# Patient Record
Sex: Female | Born: 1980 | Hispanic: Yes | Marital: Married | Smoking: Never smoker
Health system: Southern US, Community
[De-identification: ages and names within clinical notes are randomized; demographics above are authoritative.]

## PROBLEM LIST (undated history)

## (undated) ENCOUNTER — Inpatient Hospital Stay (HOSPITAL_COMMUNITY): Payer: Self-pay

---
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
## 2000-02-26 ENCOUNTER — Inpatient Hospital Stay (HOSPITAL_COMMUNITY): Admission: AD | Admit: 2000-02-26 | Discharge: 2000-02-26 | Payer: Self-pay | Admitting: *Deleted

## 2000-04-26 ENCOUNTER — Inpatient Hospital Stay (HOSPITAL_COMMUNITY): Admission: AD | Admit: 2000-04-26 | Discharge: 2000-04-26 | Payer: Self-pay | Admitting: *Deleted

## 2000-05-06 ENCOUNTER — Encounter (HOSPITAL_COMMUNITY): Admission: RE | Admit: 2000-05-06 | Discharge: 2000-05-10 | Payer: Self-pay | Admitting: Obstetrics and Gynecology

## 2000-05-08 ENCOUNTER — Encounter (INDEPENDENT_AMBULATORY_CARE_PROVIDER_SITE_OTHER): Payer: Self-pay | Admitting: Specialist

## 2000-05-08 ENCOUNTER — Inpatient Hospital Stay (HOSPITAL_COMMUNITY): Admission: AD | Admit: 2000-05-08 | Discharge: 2000-05-11 | Payer: Self-pay | Admitting: *Deleted

## 2000-05-14 ENCOUNTER — Inpatient Hospital Stay (HOSPITAL_COMMUNITY): Admission: AD | Admit: 2000-05-14 | Discharge: 2000-05-14 | Payer: Self-pay | Admitting: *Deleted

## 2006-03-15 ENCOUNTER — Emergency Department (HOSPITAL_COMMUNITY): Admission: EM | Admit: 2006-03-15 | Discharge: 2006-03-15 | Payer: Self-pay | Admitting: Emergency Medicine

## 2006-09-20 ENCOUNTER — Encounter (INDEPENDENT_AMBULATORY_CARE_PROVIDER_SITE_OTHER): Payer: Self-pay | Admitting: Family Medicine

## 2006-09-20 ENCOUNTER — Ambulatory Visit: Payer: Self-pay | Admitting: Family Medicine

## 2006-09-20 LAB — CONVERTED CEMR LAB
Eosinophils Relative: 1 % (ref 0–5)
Hemoglobin: 12.4 g/dL (ref 12.0–15.0)
Lymphocytes Relative: 21 % (ref 12–46)
MCV: 85.9 fL (ref 78.0–100.0)
Monocytes Absolute: 0.4 10*3/uL (ref 0.2–0.7)
Monocytes Relative: 4 % (ref 3–11)
Platelets: 272 10*3/uL (ref 150–400)
RDW: 12.9 % (ref 11.5–14.0)
Rh Type: POSITIVE
Rubella: 28.3 intl units/mL — ABNORMAL HIGH

## 2006-09-30 ENCOUNTER — Encounter (INDEPENDENT_AMBULATORY_CARE_PROVIDER_SITE_OTHER): Payer: Self-pay | Admitting: Family Medicine

## 2006-09-30 ENCOUNTER — Ambulatory Visit: Payer: Self-pay | Admitting: Sports Medicine

## 2006-09-30 LAB — CONVERTED CEMR LAB
Chlamydia, DNA Probe: NEGATIVE
GC Probe Amp, Genital: NEGATIVE

## 2006-10-07 ENCOUNTER — Encounter (INDEPENDENT_AMBULATORY_CARE_PROVIDER_SITE_OTHER): Payer: Self-pay | Admitting: Family Medicine

## 2006-10-07 ENCOUNTER — Ambulatory Visit (HOSPITAL_COMMUNITY): Admission: RE | Admit: 2006-10-07 | Discharge: 2006-10-07 | Payer: Self-pay | Admitting: Family Medicine

## 2006-11-02 ENCOUNTER — Ambulatory Visit: Payer: Self-pay | Admitting: Family Medicine

## 2006-12-07 ENCOUNTER — Ambulatory Visit: Payer: Self-pay | Admitting: Family Medicine

## 2006-12-23 ENCOUNTER — Ambulatory Visit: Payer: Self-pay | Admitting: Family Medicine

## 2006-12-28 ENCOUNTER — Encounter (INDEPENDENT_AMBULATORY_CARE_PROVIDER_SITE_OTHER): Payer: Self-pay | Admitting: Family Medicine

## 2006-12-28 ENCOUNTER — Ambulatory Visit: Payer: Self-pay | Admitting: Family Medicine

## 2007-01-04 ENCOUNTER — Ambulatory Visit: Payer: Self-pay | Admitting: *Deleted

## 2007-01-18 ENCOUNTER — Ambulatory Visit: Payer: Self-pay | Admitting: Family Medicine

## 2007-02-07 ENCOUNTER — Ambulatory Visit: Payer: Self-pay | Admitting: Family Medicine

## 2007-02-17 ENCOUNTER — Ambulatory Visit: Payer: Self-pay | Admitting: Family Medicine

## 2007-02-17 ENCOUNTER — Encounter (INDEPENDENT_AMBULATORY_CARE_PROVIDER_SITE_OTHER): Payer: Self-pay | Admitting: Family Medicine

## 2007-02-17 LAB — CONVERTED CEMR LAB
Chlamydia, DNA Probe: NEGATIVE
GC Probe Amp, Genital: NEGATIVE

## 2007-02-22 ENCOUNTER — Ambulatory Visit: Payer: Self-pay | Admitting: Family Medicine

## 2007-02-28 ENCOUNTER — Ambulatory Visit: Payer: Self-pay | Admitting: Family Medicine

## 2007-03-08 ENCOUNTER — Ambulatory Visit: Payer: Self-pay | Admitting: Family Medicine

## 2007-03-09 ENCOUNTER — Ambulatory Visit: Payer: Self-pay | Admitting: Gynecology

## 2007-03-09 ENCOUNTER — Inpatient Hospital Stay (HOSPITAL_COMMUNITY): Admission: AD | Admit: 2007-03-09 | Discharge: 2007-03-13 | Payer: Self-pay | Admitting: Obstetrics & Gynecology

## 2007-03-11 ENCOUNTER — Encounter (INDEPENDENT_AMBULATORY_CARE_PROVIDER_SITE_OTHER): Payer: Self-pay | Admitting: Family Medicine

## 2007-03-13 ENCOUNTER — Ambulatory Visit: Payer: Self-pay | Admitting: Family Medicine

## 2007-03-16 ENCOUNTER — Ambulatory Visit: Payer: Self-pay | Admitting: Family Medicine

## 2007-03-24 ENCOUNTER — Ambulatory Visit: Payer: Self-pay | Admitting: Family Medicine

## 2007-04-27 ENCOUNTER — Ambulatory Visit: Payer: Self-pay | Admitting: Family Medicine

## 2007-12-29 ENCOUNTER — Encounter (INDEPENDENT_AMBULATORY_CARE_PROVIDER_SITE_OTHER): Payer: Self-pay | Admitting: Family Medicine

## 2008-08-03 DIAGNOSIS — O42919 Preterm premature rupture of membranes, unspecified as to length of time between rupture and onset of labor, unspecified trimester: Secondary | ICD-10-CM

## 2009-05-17 ENCOUNTER — Encounter: Payer: Self-pay | Admitting: Family Medicine

## 2009-05-17 ENCOUNTER — Ambulatory Visit: Payer: Self-pay | Admitting: Family Medicine

## 2009-05-17 LAB — CONVERTED CEMR LAB
Antibody Screen: NEGATIVE
Basophils Absolute: 0 10*3/uL (ref 0.0–0.1)
Basophils Relative: 0 % (ref 0–1)
Eosinophils Relative: 1 % (ref 0–5)
Lymphs Abs: 2.1 10*3/uL (ref 0.7–4.0)
MCHC: 35 g/dL (ref 30.0–36.0)
Monocytes Absolute: 0.4 10*3/uL (ref 0.1–1.0)
Platelets: 291 10*3/uL (ref 150–400)
RDW: 12.6 % (ref 11.5–15.5)
Sickle Cell Screen: NEGATIVE
WBC: 10.1 10*3/uL (ref 4.0–10.5)

## 2009-05-24 ENCOUNTER — Encounter: Payer: Self-pay | Admitting: Family Medicine

## 2009-05-24 ENCOUNTER — Ambulatory Visit: Payer: Self-pay | Admitting: Family Medicine

## 2009-05-24 LAB — CONVERTED CEMR LAB: Chlamydia, DNA Probe: NEGATIVE

## 2009-05-29 ENCOUNTER — Encounter: Payer: Self-pay | Admitting: Family Medicine

## 2009-06-24 ENCOUNTER — Ambulatory Visit: Payer: Self-pay | Admitting: Family Medicine

## 2009-06-24 ENCOUNTER — Ambulatory Visit (HOSPITAL_COMMUNITY): Admission: RE | Admit: 2009-06-24 | Discharge: 2009-06-24 | Payer: Self-pay | Admitting: Family Medicine

## 2009-06-24 ENCOUNTER — Encounter: Payer: Self-pay | Admitting: Family Medicine

## 2009-07-17 ENCOUNTER — Ambulatory Visit: Payer: Self-pay | Admitting: Family Medicine

## 2009-07-17 ENCOUNTER — Inpatient Hospital Stay (HOSPITAL_COMMUNITY): Admission: AD | Admit: 2009-07-17 | Discharge: 2009-07-19 | Payer: Self-pay | Admitting: Obstetrics & Gynecology

## 2009-07-17 ENCOUNTER — Ambulatory Visit: Payer: Self-pay | Admitting: Family

## 2009-08-03 DIAGNOSIS — O42919 Preterm premature rupture of membranes, unspecified as to length of time between rupture and onset of labor, unspecified trimester: Secondary | ICD-10-CM

## 2009-08-27 ENCOUNTER — Ambulatory Visit: Payer: Self-pay | Admitting: Family Medicine

## 2009-11-04 ENCOUNTER — Encounter: Payer: Self-pay | Admitting: Family Medicine

## 2009-11-04 ENCOUNTER — Ambulatory Visit: Payer: Self-pay | Admitting: Family Medicine

## 2009-11-04 DIAGNOSIS — R109 Unspecified abdominal pain: Secondary | ICD-10-CM

## 2009-11-04 LAB — CONVERTED CEMR LAB: Beta hcg, urine, semiquantitative: POSITIVE

## 2009-11-11 ENCOUNTER — Encounter: Payer: Self-pay | Admitting: Family Medicine

## 2009-11-11 ENCOUNTER — Ambulatory Visit: Payer: Self-pay | Admitting: Family Medicine

## 2009-11-11 LAB — CONVERTED CEMR LAB
Eosinophils Absolute: 0.3 10*3/uL (ref 0.0–0.7)
Eosinophils Relative: 4 % (ref 0–5)
Hemoglobin: 12.9 g/dL (ref 12.0–15.0)
Lymphocytes Relative: 29 % (ref 12–46)
MCHC: 34.6 g/dL (ref 30.0–36.0)
Monocytes Absolute: 0.5 10*3/uL (ref 0.1–1.0)
Monocytes Relative: 5 % (ref 3–12)
Platelets: 258 10*3/uL (ref 150–400)
Sickle Cell Screen: NEGATIVE
WBC: 9 10*3/uL (ref 4.0–10.5)

## 2009-11-12 ENCOUNTER — Encounter: Payer: Self-pay | Admitting: Family Medicine

## 2009-11-19 ENCOUNTER — Inpatient Hospital Stay (HOSPITAL_COMMUNITY): Admission: AD | Admit: 2009-11-19 | Discharge: 2009-11-19 | Payer: Self-pay | Admitting: Obstetrics and Gynecology

## 2009-12-09 ENCOUNTER — Encounter: Payer: Self-pay | Admitting: *Deleted

## 2009-12-10 ENCOUNTER — Encounter: Payer: Self-pay | Admitting: Family Medicine

## 2009-12-16 ENCOUNTER — Ambulatory Visit: Payer: Self-pay | Admitting: Family Medicine

## 2009-12-16 ENCOUNTER — Encounter: Payer: Self-pay | Admitting: Family Medicine

## 2009-12-16 DIAGNOSIS — R300 Dysuria: Secondary | ICD-10-CM | POA: Insufficient documentation

## 2009-12-16 LAB — CONVERTED CEMR LAB
GC Probe Amp, Genital: NEGATIVE
Ketones, urine, test strip: NEGATIVE
Nitrite: NEGATIVE
Protein, U semiquant: NEGATIVE
Whiff Test: NEGATIVE
pH: 5.5

## 2009-12-20 ENCOUNTER — Encounter: Payer: Self-pay | Admitting: Family Medicine

## 2009-12-20 ENCOUNTER — Ambulatory Visit (HOSPITAL_COMMUNITY): Admission: RE | Admit: 2009-12-20 | Discharge: 2009-12-20 | Payer: Self-pay | Admitting: Family Medicine

## 2009-12-23 ENCOUNTER — Ambulatory Visit: Payer: Self-pay | Admitting: Family Medicine

## 2009-12-27 ENCOUNTER — Encounter: Payer: Self-pay | Admitting: Family Medicine

## 2010-01-01 ENCOUNTER — Ambulatory Visit: Payer: Self-pay | Admitting: Family Medicine

## 2010-01-01 DIAGNOSIS — N898 Other specified noninflammatory disorders of vagina: Secondary | ICD-10-CM | POA: Insufficient documentation

## 2010-01-01 LAB — CONVERTED CEMR LAB

## 2010-01-03 ENCOUNTER — Encounter: Payer: Self-pay | Admitting: Family Medicine

## 2010-01-09 ENCOUNTER — Ambulatory Visit: Payer: Self-pay | Admitting: Family Medicine

## 2010-01-15 ENCOUNTER — Ambulatory Visit (HOSPITAL_COMMUNITY): Admission: RE | Admit: 2010-01-15 | Discharge: 2010-01-15 | Payer: Self-pay | Admitting: Family Medicine

## 2010-01-16 ENCOUNTER — Ambulatory Visit: Payer: Self-pay | Admitting: Obstetrics & Gynecology

## 2010-01-23 ENCOUNTER — Ambulatory Visit: Payer: Self-pay | Admitting: Obstetrics & Gynecology

## 2010-01-30 ENCOUNTER — Ambulatory Visit: Payer: Self-pay | Admitting: Obstetrics & Gynecology

## 2010-01-30 ENCOUNTER — Ambulatory Visit (HOSPITAL_COMMUNITY): Admission: RE | Admit: 2010-01-30 | Discharge: 2010-01-30 | Payer: Self-pay | Admitting: Obstetrics & Gynecology

## 2010-02-06 ENCOUNTER — Ambulatory Visit: Payer: Self-pay | Admitting: Obstetrics and Gynecology

## 2010-02-12 ENCOUNTER — Inpatient Hospital Stay (HOSPITAL_COMMUNITY): Admission: AD | Admit: 2010-02-12 | Discharge: 2010-02-16 | Payer: Self-pay | Admitting: Obstetrics & Gynecology

## 2010-02-12 ENCOUNTER — Inpatient Hospital Stay (HOSPITAL_COMMUNITY): Admission: AD | Admit: 2010-02-12 | Discharge: 2010-02-12 | Payer: Self-pay | Admitting: Obstetrics and Gynecology

## 2010-02-12 ENCOUNTER — Ambulatory Visit: Payer: Self-pay | Admitting: Obstetrics & Gynecology

## 2010-02-14 ENCOUNTER — Ambulatory Visit (HOSPITAL_COMMUNITY): Admission: RE | Admit: 2010-02-14 | Discharge: 2010-02-14 | Payer: Self-pay | Admitting: Obstetrics & Gynecology

## 2010-02-15 ENCOUNTER — Encounter: Payer: Self-pay | Admitting: Obstetrics & Gynecology

## 2010-03-03 DEATH — deceased

## 2010-08-24 ENCOUNTER — Encounter: Payer: Self-pay | Admitting: *Deleted

## 2010-09-02 NOTE — Miscellaneous (Signed)
Summary: does not need high risk following her  Clinical Lists Changes Raynelle Fanning from [facility] clinics [phone]) stated prior spontaneous abortion & previous c sections do not qualify her for Hi Risk clinic. states she can be followed here. at 30 weeks she will make appt there to discuss options of repeat C section or VBAC. asked that she fax the referral back & I will notifiy the pcp.[place] RN  Dec 10, 2009 9:44 AM  appt with Dr. Gwendolyn Grant 5/16 at [facility] RN  Dec 10, 2009 10:37 AM

## 2010-09-02 NOTE — Assessment & Plan Note (Signed)
Summary: yeast infectionper pt/[place]   Vital Signs:  Patient profile:   30 year old female Weight:      169.7 pounds Temp:     98.4 degrees F Pulse rate:   85 / minute BP sitting:   118 / 80  Vitals Entered By: Loralee Pacas CMA (January 01, 2010 3:03 PM)  Primary Care Provider:  Renold Don MD  CC:  vag odor and decreased fetal movement.  History of Present Illness: C/O scant clear vag discharge.  Not pruritic.  Pregnant with recent antibiotic treatment.    Also C/O decreased fetal movement over last 24 hours.  Baby does not move much at baseline but less in past 24 h.  She is only at [redacted] wks gestation so I do not anticipate much in the way of fetal movement.  No other complaints.  Specifically denies fever, vag bleeding, STD exposure or constitutional symptoms   Allergies: No Known Drug Allergies  Physical Exam  General:  Well-developed,well-nourished,in no acute distress; alert,appropriate and cooperative throughout examination Abdomen:  Normal abd.  Fundus well below umbilicus.  FHTs heard with doppler at 150-160 BPM.   Genitalia:  Normal introitus for age, no external lesions, no vaginal discharge, mucosa pink and moist, no vaginal or cervical lesions, no vaginal atrophy, no friaility or hemorrhage, normal uterus size and position, no adnexal masses or tenderness Wet prep done despite lack of discharge.   Impression & Recommendations:  Problem # 1:  VAGINAL DISCHARGE (ICD-623.5) Complaint of vag discharge with no DC on exam and normal wet prep Orders: Wet Prep- [facility] (87210) [facility]- Est Level  3 (74259)  Problem # 2:  PREGNANCY, SUPERVISION OF OTHER NORMAL (ICD-V22.1) She is worried about decrease fetal movement - educated that at 14 weeks, fetal movement is not typically felt.  18 weeks is more likely.  Reassured about normal exam and FHTs.  Provided red flag symptoms for return.  FU normal next preg visit with Dr. Gwendolyn Grant.  Complete Medication List: 1)  Prenatal  Vitamins 0.8 Mg Tabs (Prenatal multivit-min-fe-fa) .Marland Kitchen.. 1 tab by mouth daily 2)  Flonase 50 Mcg/act Susp (Fluticasone propionate) .... 2 sprays each nostril daily  Laboratory Results  Date/Time Received: January 01, 2010 2:35 PM  Date/Time Reported: January 01, 2010 3:18 PM   [employer] Source: vag WBC/hpf: rare Bacteria/hpf: 2+  Rods Clue cells/hpf: none  Negative whiff Yeast/hpf: none Trichomonas/hpf: none Comments: ...............test performed by......Marland KitchenBonnie A. Swaziland, MLS (ASCP)cm

## 2010-09-02 NOTE — Letter (Signed)
Summary: Pregnancy Certification  [facility]  [address]   [place], [place] [address]   Phone: [phone]  Fax: [phone]    11/04/2009  Teresa Day [address] [place], [place]  [address]  This letter is to certify that the above named patient had a positive pregnancy test in my office today.  Sincerely,   Romero Belling MD

## 2010-09-02 NOTE — Miscellaneous (Signed)
  Clinical Lists Changes  Spoke with Milus Mallick the interpreter.  Patient scheduled wtih [facility] for this coming Thursday June 8 at 1:30 PM.  Renold Don MD  January 03, 2010 5:03 PM

## 2010-09-02 NOTE — Assessment & Plan Note (Signed)
Summary: ob visit/[place]   Vital Signs:  Patient profile:   30 year old female LMP:     09/25/2009 Weight:      167.3 pounds BP sitting:   114 / 79  Vitals Entered By: Garen Grams LPN (Dec 16, 2009 3:20 PM)  Primary Care Provider:  Renold Don MD   History of Present Illness: 30 yo Female here for follow-up appt for Return OB.  Please see Flowsheet and A&P for details.    Habits & Providers  Alcohol-Tobacco-Diet     Cigarette Packs/Day: n/a  Current Problems (verified): 1)  Dysuria  (ICD-788.1) 2)  Pregnancy, Supervision of Other Normal  (ICD-V22.1) 3)  Inguinal Pain, Bilateral  (ICD-789.09)  Current Medications (verified): 1)  Prenatal Vitamins 0.8 Mg Tabs (Prenatal Multivit-Min-Fe-Fa) .Marland Kitchen.. 1 Tab By Mouth Daily 2)  Flonase 50 Mcg/act Susp (Fluticasone Propionate) .... 2 Sprays Each Nostril Daily 3)  Cephalexin 500 Mg Caps (Cephalexin) .... One By Mouth Two Times A Day  Allergies (verified): No Known Drug Allergies  Past History:  Past medical, surgical, family and social histories (including risk factors) reviewed, and no changes noted (except as noted below).  Past Medical History: Reviewed history from 05/24/2009 and no changes required. Unremarkable.    Past Surgical History: Reviewed history from 05/24/2009 and no changes required. C-section x 2.  Otherwise no surgeries.   Family History: Reviewed history from 11/11/2009 and no changes required. Family history positive for HTN, asthma.  Denies any cancers, genetic abnormalities, or mental retardation.    Social History: Reviewed history from 05/24/2009 and no changes required. Patient lives with her boyfriend, who is also the father of her 2 other children.  States she feels safe at home, has not been hit or kicked anytime within the past year.  Denies any tobacco, alcohol, or drug use.    Review of Systems       no headaches, vision changes, chest pain, dyspnea, nausea/vomiting, changes in bowel habits,  lower extremity edema   Physical Exam  General:  Vital signs reviewed Well-developed, well-nourished patient in NAD.  Awake, cooperative.  Mouth:  oral mucosa moist, dentition fair Lungs:   Lungs are clear to auscultation, no crackles or wheezes. Heart:  RRR with II/VI murmur upper sternal border, non-radiating Abdomen:  gravid, fundal height and FHTs appropriate for gestational age, bowel sounds present in all four quadrants  Pulses:  distal pulses palpable Extremities:  no lower extremity edema   Impression & Recommendations:  Problem # 1:  PREGNANCY, SUPERVISION OF OTHER NORMAL (ICD-V22.1) 30 yo [id number] who presents today at 11.6 weeks by sure LMP.     Spontaneous abortion in February at 20+ weeks.  2 previous C-sections.  Will need discussion with OB at 30 weeks for delivery planning. Initial labs:  B+/Ab neg, Hgb 12.9/Hct 37.2, Platelets 258, Hep B Neg, RPR NR, Rubella Imm, Haskell Neg, HIV NR  Patient doing well.  Plan for Glucola test next visit as she has history of spont ab at 20+ weeks.  Discussed this patient with Dr. Mauricio Po, will need to decide whether she needs to be seen in [facility] for 20+ abortion.  Discussed with Dr. Swaziland previously, after much discussion decision was made not to be seen in [facility].  Will contact OBs at [facility] and make decision.  Fundal height and FHTs unable to be assessed as she is less than [redacted] weeks pregnant.  Will schedule for [place] for anatomy and fu in 4 weeks.    Orders: GC/Chlamydia-[facility] (  [id number]) Other OB visit- [facility] (OBCK)  Problem # 2:  DYSURIA (ICD-788.1) Patient diagnosed with UTI via urinalysis.  Plan to treat with Cephalexin 500 mg by mouth two times a day x 14 days.   Her updated medication list for this problem includes:    Cephalexin 500 Mg Caps (Cephalexin) ..... One by mouth two times a day  Orders: Urinalysis-[facility] (00000) Wet Prep- [facility] ([address]) Other OB visit- [facility] (OBCK)  Complete Medication List: 1)  Prenatal Vitamins 0.8 Mg Tabs  (Prenatal multivit-min-fe-fa) .Marland Kitchen.. 1 tab by mouth daily 2)  Flonase 50 Mcg/act Susp (Fluticasone propionate) .... 2 sprays each nostril daily 3)  Cephalexin 500 Mg Caps (Cephalexin) .... One by mouth two times a day  Other Orders: Ultrasound (Ultrasound)  Patient Instructions: 1)  It was good to see you again today. 2)  You have a urinary tract infection.  Take the medicine in AM and PM for 3 days.  3)  If you have any vaginal bleeding, loss of fluid, not feeling the baby move, or any contractions, call the clinic or go to MAU.   4)  Return in 4 weeks. Prescriptions: CEPHALEXIN 500 MG CAPS (CEPHALEXIN) one by mouth two times a day  #14 x 0   Entered and Authorized by:   Doralee Albino MD   Signed by:   Doralee Albino MD on 12/17/2009   Method used:   Electronically to        [facility] [phone]* (retail)       [address]       [place], [place]  [address]       Ph: [phone]       Fax: [phone]   RxID:   [id number] NITROFURANTOIN MACROCRYSTAL 100 MG CAPS (NITROFURANTOIN MACROCRYSTAL) Take 1 pill by mouth two times a day  #6 x 0   Entered and Authorized by:   Renold Don MD   Signed by:   Renold Don MD on 12/16/2009   Method used:   Electronically to        [facility] [phone]* (retail)       [address]       [place], [place]  [address]       Ph: [phone]       Fax: [phone]   RxID:   [id number]    OB Initial Intake Information    Positive HCG by: self    Race: Hispanic    Marital status: Single    Number of children at home: 2  FOB Information    Husband/Father of baby: Phylis Bougie    FOB occupation Holiday representative  Menstrual History    LMP (date): 09/25/2009    EDC by LMP: 07/02/2010    Best Working EDC: 07/02/2010    LMP - Character: normal    Menarche: 13 years    Menses interval: 28 days    Menstrual flow 3 days   Flowsheet View for Follow-up Visit    Estimated weeks of       gestation:     11 6/7    Weight:      167.3    Blood pressure:   114 / 79    Urine Protein:     negative    Urine Glucose:   negative    Urine Nitrite:     negative    Headache:     No    Nausea/vomiting:   No    Edema:  0    Vaginal bleeding:   no    Vaginal discharge:   no    Fundal height:      -    FHR:       -    Fetal activity:     yes    Labor symptoms:   no    Taking prenatal vits?   Y    Smoking:     n/a    Next visit:     4 wk    Resident:     Gwendolyn Grant    Preceptor:     Mauricio Po    Comment:     vaginal pain x 1 week, describes as burning    Flowsheet View for Follow-up Visit    Estimated weeks of       gestation:     11 6/7    Weight:     167.3    Blood pressure:   114 / 79    Urine protein:       negative    Urine glucose:    negative    Urine nitrite:     negative    Hx headache?     No    Nausea/vomiting?   No    Edema?     0    Bleeding?     no    Leakage/discharge?   no    Fetal activity:       yes    Labor symptoms?   no    Fundal height:      -    FHR:       -    Taking Vitamins?   Y    Smoking PPD:   n/a    Comment:     vaginal pain x 1 week, describes as burning    Next visit:     4 wk    Resident:     Gwendolyn Grant    Preceptor:     Link Snuffer Initial Intake Information    Positive HCG by: self    Race: Hispanic    Marital status: Single    Number of children at home: 2  FOB Information    Husband/Father of baby: Phylis Bougie    FOB occupation Holiday representative  Menstrual History    LMP (date): 09/25/2009    EDC by LMP: 07/02/2010    Best Working EDC: 07/02/2010    LMP - Character: normal    Menarche: 13 years    Menses interval: 28 days    Menstrual flow 3 days  Laboratory Results   Urine Tests  Date/Time Received: Dec 16, 2009 3:51 PM  Date/Time Reported: Dec 16, 2009 4:15 PM   Routine Urinalysis   Color: yellow Appearance: Clear Glucose: negative   (Normal Range: Negative) Bilirubin: negative   (Normal Range: Negative) Ketone: negative   (Normal Range:  Negative) Spec. Gravity: <1.005   (Normal Range: 1.003-1.035) Blood: small   (Normal Range: Negative) pH: 5.5   (Normal Range: 5.0-8.0) Protein: negative   (Normal Range: Negative) Urobilinogen: 0.2   (Normal Range: 0-1) Nitrite: negative   (Normal Range: Negative) Leukocyte Esterace: negative   (Normal Range: Negative)  Urine Microscopic WBC/HPF: rare RBC/HPF: rare Bacteria/HPF: trace Epithelial/HPF: rare    Comments: ...............test performed by......Marland KitchenBonnie A. Swaziland, MLS (ASCP)cm  Date/Time Received: Dec 16, 2009 4:05 PM  Date/Time Reported: Dec 16, 2009 4:23 PM   [employer] Source: vag WBC/hpf: 5-10 Bacteria/hpf: 3+  Rods Clue  cells/hpf: none  Negative whiff Yeast/hpf: none Trichomonas/hpf: none Comments: ...............test performed by......Marland KitchenBonnie A. [place], MLS (ASCP)cm     Appended Document: ob visit/[place] Patient with second-trimester loss, for referral to [facility] for further evaluation.

## 2010-09-02 NOTE — Miscellaneous (Signed)
  Clinical Lists Changes  Orders: Added new Referral order of Obstetric Referral (Obstetric) - Signed

## 2010-09-02 NOTE — Assessment & Plan Note (Signed)
Summary: 6 wk pp ck,df   Vital Signs:  Patient profile:   30 year old female Weight:      166 pounds Temp:     97.0 degrees F Pulse rate:   81 / minute BP sitting:   114 / 80  Vitals Entered By: Jone Baseman CMA (August 27, 2009 1:45 PM) CC: 6 week post partum Is Patient Diabetic? No Pain Assessment Patient in pain? no        Primary Care Provider:  Renold Don MD  CC:  6 week post partum.  History of Present Illness: 30 yo female here for 6 week postpartum check s/p delivery of premature nonviable infant at 23 weeks.  Patient presented to clinic with abdominal pain and bleeding in December, transferred immediately to MAU at [facility] where she went through premature labor.  Attempts to revive fetus were unsuccessful.  Unsure exact etiology of premature labor, placental abruption cannot be ruled out.  Patient here today for "medical check-up" as she wants to become pregnant again.  Does not desire birth control.    ROS:  No fevers, chills, abdominal pain, headache vision changes.  Just started menstrual cycle which is normal for her.    Habits & Providers  Alcohol-Tobacco-Diet     Tobacco Status: never  Current Medications (verified): 1)  P D Natal Vitamins/folic Acid  Tabs (Prenatal Multivit-Min-Fe-Fa)  Allergies (verified): No Known Drug Allergies  Physical Exam  General:  Vital signs reviewed Well-developed, well-nourished patient in NAD.  Awake, cooperative.  Lungs:  Normal respiratory effort, chest expands symmetrically. Lungs are clear to auscultation, no crackles or wheezes. Heart:  Normal rate and regular rhythm. S1 and S2 normal without gallop, murmur, click, rub or other extra sounds. Abdomen:  soft/nondistended/nontender, + bowel sounds all 4 quadrants.   Psych:  Cognition and judgment appear intact. Alert and cooperative with normal attention span and concentration. No apparent delusions, illusions, hallucinations   Impression &  Recommendations:  Problem # 1:  ROUTINE POSTPARTUM FOLLOW-UP (ICD-V24.2) Patient desires attempting to have more children.  Does not desire contraception at this time.  Reviewed patietn's records including most recent hospital stay.  No medical reason that would prevent patient from another attempt at pregnancy.  Will need to be followed in high risk clinic for 2 prior C-sections.  Discussed risks and benefits with patients, patient very certain about another attempt at pregnancy.  No complications/depression regarding most recent pregnancy outcome.  Told patient to continue with prenatal vitamins.   Orders: [facility]- Est Level  3 ([id number])  Complete Medication List: 1)  P D Natal Vitamins/folic Acid Tabs (Prenatal multivit-min-fe-fa)  Appended Document: 6 wk pp ck,df     Allergies: No Known Drug Allergies   Complete Medication List: 1)  P D Natal Vitamins/folic Acid Tabs (Prenatal multivit-min-fe-fa)  Other Orders: Postpartum Visit ([id number])

## 2010-09-02 NOTE — Miscellaneous (Signed)
Summary: High Risk Clinic Referral  Patient has been referred to [facility] clinic.  The following paperwork has been faxed: * Inital OB * Prenatal Flowsheet * Labs: prenatal panel * UA * Registration/Insurance information  Dennison Nancy RN  Dec 09, 2009 2:57 PM

## 2010-09-02 NOTE — Assessment & Plan Note (Signed)
Summary: FU/KH   Vital Signs:  Patient profile:   30 year old female LMP:     09/25/2008 Weight:      165 pounds BP sitting:   114 / 75  Vitals Entered By: Jone Baseman CMA (November 11, 2009 9:49 AM)  Primary Care Provider:  Renold Don MD  CC:  pregnancy.  History of Present Illness: Pt is 30 yo female with previous spontaneous abortion 3 months ago.  Positive pregnancy test at home last week, confirmed with positive pregnancy test in clinic.  Here for OB evaluation.  See OB flowsheet.  No problems or concerns per patient at this time.    Current Medications (verified): 1)  Prenatal Vitamins 0.8 Mg Tabs (Prenatal Multivit-Min-Fe-Fa) .Marland Kitchen.. 1 Tab By Mouth Daily 2)  Flonase 50 Mcg/act Susp (Fluticasone Propionate) .... 2 Sprays Each Nostril Daily  Allergies (verified): No Known Drug Allergies  Past History:  Past medical, surgical, family and social histories (including risk factors) reviewed, and no changes noted (except as noted below).  Past Medical History: Reviewed history from 05/24/2009 and no changes required. Unremarkable.    Past Surgical History: Reviewed history from 05/24/2009 and no changes required. C-section x 2.  Otherwise no surgeries.   Family History: Reviewed history from 05/24/2009 and no changes required. Family history positive for HTN, asthma.  Denies any cancers, genetic abnormalities, or mental retardation.    Social History: Reviewed history from 05/24/2009 and no changes required. Patient lives with her boyfriend, who is also the father of her 2 other children.  States she feels safe at home, has not been hit or kicked anytime within the past year.  Denies any tobacco, alcohol, or drug use.    Review of Systems       No headaches, vision changes, nausea, vomiting, abdominal pain, lower extremity swelling.  Physical Exam  General:  Vital signs reviewed Well-developed, well-nourished patient in NAD.  Awake, cooperative.  Mouth:  oral  mucosa moist, dentition fair Neck:  no lymphadenopathy Lungs:   Lungs are clear to auscultation, no crackles or wheezes. Heart:  RRR with II/VI murmur upper sternal border, non-radiating Abdomen:  soft/nondistended/nontender, good bowel sounds throughout Pulses:  distal pulses palpable lower extremities Extremities:  no lower extremity swelling Neurologic:  strength normal in all extremities and sensation intact to light touch.   Psych:  normally interactive, good eye contact, not anxious appearing, and not depressed appearing.     Impression & Recommendations:  Problem # 1:  PREGNANCY, SUPERVISION OF OTHER NORMAL (ICD-V22.1) 30 yo [id number] who presents today at 6.4 weeks after positive pregnancy test at home and confirmed in clinic.  Completed OB Flowsheet today, no changes from prior pregnancy.  Spontaneous abortion in February at 20+ weeks.  2 previous C-sections.  No need for high risk at this time, will need to schedule at 30 weeks for delivery options, most likely repeat LTCS.  Will obtain prenatal labs and screens today.   Orders: Prenatal-[facility] ([id number]) CBC-[facility] [id number]) HIV-[facility] [id number]) Sickle Cell Scr-[facility] ([id number]) Urine Culture-[facility] ([id number]) RPR-[facility] ([id number]) Other OB visit- [facility] (OBCK)  Complete Medication List: 1)  Prenatal Vitamins 0.8 Mg Tabs (Prenatal multivit-min-fe-fa) .Marland Kitchen.. 1 tab by mouth daily 2)  Flonase 50 Mcg/act Susp (Fluticasone propionate) .... 2 sprays each nostril daily  Patient Instructions: 1)  Congratulations on your new pregnancy! 2)  We will schedule you for a High Risk appt at [facility] because of your previous C-sections and the miscarriage. 3)  For your allergies, use the  Flonase 2 sprays in each nostril everyday.  It will prevent allergies and drainage.  For immediate symptom relief, use the Benadryl. 4)  For your constipation, schedule the prune juice every morning and evening.  Even after you start having bowel movements,  continue the prune juice.  If it doesn't get better, please let us know. 5)  We will get your labwork completed today.   Prescriptions: FLONASE 50 MCG/ACT SUSP (FLUTICASONE PROPIONATE) 2 sprays each nostril daily  #31 x 2   Entered and Authorized by:   Renold Don MD   Signed by:   Renold Don MD on 11/11/2009   Method used:   Print then Give to Patient   RxID:   [id number] FLONASE 50 MCG/ACT SUSP (FLUTICASONE PROPIONATE) 2 sprays each nostril daily  #31 x 2   Entered and Authorized by:   Renold Don MD   Signed by:   Renold Don MD on 11/11/2009   Method used:   Print then Give to Patient   RxID:   [id number]    Flowsheet View for Follow-up Visit    Weight:     165    Blood pressure:   114 / 75    OB Initial Intake Information    Positive HCG by: self    Race: Hispanic    Marital status: Single    Number of children at home: 2  FOB Information    Husband/Father of baby: Phylis Bougie    FOB occupation Holiday representative  Menstrual History    LMP (date): 09/25/2008    EDC by LMP: 07/02/2009    Best Working EDC: 07/02/2009    LMP - Character: normal    LMP - Reliable? : Yes    Menarche: 13 years    Menses interval: 28 days    Menstrual flow 3 days    Symptoms since LMP: amenorrhea   Flowsheet View for Follow-up Visit    Weight:     165    Blood pressure:   114 / 75  Prenatal Visit    FOB name: Phylis Bougie [facility] Confirmation:    New working [facility]: 07/02/2009    LMP reliable? Yes    Last menses onset (LMP) date: 09/25/2008    [facility] by LMP: 07/02/2009    Genetic History    Father of baby:   Phylis Bougie     Thalassemia:         father: no    Neural tube defect:       father: no    Down's Syndrome:       father: no    Tay-Sachs:         father: no    Sickle Cell Dz/Trait:       father: no    Hemophilia:         father: no    Muscular Dystrophy:       father: no    Cystic Fibrosis:       father: no    Huntington's Dz:       father: no    Mental  Retardation:       father: no    Fragile X:         father: no    Other Genetic or       Chromosomal Dz:       father: no    Child with other       birth defect:         father:  no  Infection Risk History    History of Parvovirus (Fifth Disease): no    Occupational Exposure to Children: none

## 2010-09-02 NOTE — Assessment & Plan Note (Signed)
Summary: R GROIN PAIN/KH   Vital Signs:  Patient profile:   30 year old female Weight:      164 pounds Temp:     98.2 degrees F oral Pulse rate:   79 / minute BP sitting:   113 / 75  (right arm) Cuff size:   regular  Vitals Entered By: Tessie Fass CMA (November 04, 2009 3:44 PM)  Primary Care Provider:  Renold Don MD   History of Present Illness: 30 yo female presenting with 1 day history of bilateral inguinal pain.  Pain is mild, worse with doing housework and walking.  Has not tried anything for it.  No abdominal pain, vaginal discharge or bleeding.  Also with positive home pregnancy test 04/02 and wants it confirmed in clinic today.  She has had 2 prior deliveries via C-section (confirmed to be LTCS in [place]).  Had a 23-week demise in December.  LMP 09/25/2009.  Ran out of prenatal vitamins 2 weeks ago and hasn't taken any since.  Denies medical illness.  Allergies: No Known Drug Allergies  Physical Exam  General:  Well-developed,well-nourished,in no acute distress; alert,appropriate and cooperative throughout examination Abdomen:  Bowel sounds positive,abdomen soft and non-tender without masses, organomegaly or hernias noted.   Impression & Recommendations:  Problem # 1:  INGUINAL PAIN, BILATERAL (ICD-789.09) Assessment New Uncertain cause, likely related to pregnancy though she is quite early in the pregnancy (LMP 02/23).  Recommended APAP, return to clinic or present to MAU if vaginal bleeding or discharge. Orders: [facility]- Est Level  3 ([id number])  Problem # 2:  PREGNANCY, SUPERVISION OF OTHER NORMAL (ICD-V22.1) Assessment: New LMP 02/23.  Positive UPT at home on 04/02, confirmed with positive UPT in clinic today.  Patient has had 2 prior LTCS's so will need referral to high risk clinic.  Also with 23 week fetal demise in December.  Is without insurance, but has [facility] appt scheduled for Wednesday and will RTC next week for initial OB visit with PCP.  Denies medical  illness.  Not taking prenatal vitamins--refilled for her today.  Complete Medication List: 1)  Prenatal Vitamins 0.8 Mg Tabs (Prenatal multivit-min-fe-fa) .Marland Kitchen.. 1 tab by mouth daily  Other Orders: U Preg-[facility] ([id number])  Patient Instructions: 1)  Pleasure to meet you today. 2)  Congratulations on your pregnancy. 3)  Please see [facility] immediately, then schedule a visit with [place]. 4)  Please schedule a follow-up appointment in 1 week with Dr. Gwendolyn Grant. Prescriptions: PRENATAL VITAMINS 0.8 MG TABS (PRENATAL MULTIVIT-MIN-FE-FA) 1 tab by mouth daily  #90 x 4   Entered by:   Romero Belling MD   Authorized by:   Zachery Dauer MD   Signed by:   Romero Belling MD on 11/04/2009   Method used:   Electronically to        [facility] [phone]* (retail)       [address]       [place], [place]  [address]       Ph: [phone]       Fax: [phone]   RxID:   [id number]   Laboratory Results   Urine Tests  Date/Time Received: November 04, 2009 4:26 PM  Date/Time Reported: November 04, 2009 4:29 PM     Urine HCG: positive Comments: ...........test performed by...........Marland KitchenTerese Door, CMA

## 2010-09-12 ENCOUNTER — Encounter: Payer: Self-pay | Admitting: *Deleted

## 2010-10-18 LAB — CBC
HCT: 28.1 % — ABNORMAL LOW (ref 36.0–46.0)
Hemoglobin: 10.2 g/dL — ABNORMAL LOW (ref 12.0–15.0)
Hemoglobin: 11.2 g/dL — ABNORMAL LOW (ref 12.0–15.0)
MCH: 31.6 pg (ref 26.0–34.0)
MCHC: 35.3 g/dL (ref 30.0–36.0)
MCHC: 36.3 g/dL — ABNORMAL HIGH (ref 30.0–36.0)
MCV: 89.4 fL (ref 78.0–100.0)
MCV: 89.6 fL (ref 78.0–100.0)
MCV: 90 fL (ref 78.0–100.0)
Platelets: 217 10*3/uL (ref 150–400)
RDW: 12 % (ref 11.5–15.5)
WBC: 10.2 10*3/uL (ref 4.0–10.5)
WBC: 11.6 10*3/uL — ABNORMAL HIGH (ref 4.0–10.5)

## 2010-10-18 LAB — CROSSMATCH: Antibody Screen: NEGATIVE

## 2010-10-19 LAB — POCT URINALYSIS DIP (DEVICE)
Bilirubin Urine: NEGATIVE
Glucose, UA: NEGATIVE mg/dL
Specific Gravity, Urine: 1.02 (ref 1.005–1.030)

## 2010-10-19 LAB — URINALYSIS, ROUTINE W REFLEX MICROSCOPIC
Bilirubin Urine: NEGATIVE
Glucose, UA: NEGATIVE mg/dL
Ketones, ur: NEGATIVE mg/dL
Specific Gravity, Urine: 1.02 (ref 1.005–1.030)
pH: 8 (ref 5.0–8.0)

## 2010-10-19 LAB — URINE MICROSCOPIC-ADD ON

## 2010-10-19 LAB — STREP B DNA PROBE: Strep Group B Ag: NEGATIVE

## 2010-10-19 LAB — CBC
MCH: 31.3 pg (ref 26.0–34.0)
MCHC: 34.6 g/dL (ref 30.0–36.0)
MCV: 90.5 fL (ref 78.0–100.0)
Platelets: 240 10*3/uL (ref 150–400)

## 2010-10-19 LAB — RAPID HIV SCREEN (WH-MAU): Rapid HIV Screen: NONREACTIVE

## 2010-10-19 LAB — WET PREP, GENITAL: Clue Cells Wet Prep HPF POC: NONE SEEN

## 2010-10-20 LAB — POCT URINALYSIS DIP (DEVICE)
Bilirubin Urine: NEGATIVE
Glucose, UA: NEGATIVE mg/dL
Ketones, ur: NEGATIVE mg/dL
Nitrite: NEGATIVE
Nitrite: NEGATIVE
Protein, ur: NEGATIVE mg/dL
Urobilinogen, UA: 0.2 mg/dL (ref 0.0–1.0)
pH: 6 (ref 5.0–8.0)
pH: 7 (ref 5.0–8.0)

## 2010-10-20 LAB — GLUCOSE, CAPILLARY: Glucose-Capillary: 110 mg/dL — ABNORMAL HIGH (ref 70–99)

## 2010-10-21 LAB — URINALYSIS, ROUTINE W REFLEX MICROSCOPIC
Bilirubin Urine: NEGATIVE
Ketones, ur: NEGATIVE mg/dL
Nitrite: NEGATIVE
Protein, ur: NEGATIVE mg/dL
Specific Gravity, Urine: <1.005 — ABNORMAL LOW (ref 1.005–1.030)
Urobilinogen, UA: 0.2 mg/dL (ref 0.0–1.0)

## 2010-10-21 LAB — URINE MICROSCOPIC-ADD ON

## 2010-11-03 LAB — CBC
HCT: 32.6 % — ABNORMAL LOW (ref 36.0–46.0)
Hemoglobin: 11.3 g/dL — ABNORMAL LOW (ref 12.0–15.0)
MCHC: 34.8 g/dL (ref 30.0–36.0)
MCV: 89.6 fL (ref 78.0–100.0)
RBC: 3.64 MIL/uL — ABNORMAL LOW (ref 3.87–5.11)
RDW: 12.8 % (ref 11.5–15.5)

## 2010-11-04 LAB — DIFFERENTIAL
Basophils Absolute: 0.1 10*3/uL (ref 0.0–0.1)
Basophils Relative: 0 % (ref 0–1)
Lymphocytes Relative: 9 % — ABNORMAL LOW (ref 12–46)
Monocytes Absolute: 0.6 10*3/uL (ref 0.1–1.0)
Neutro Abs: 18.8 10*3/uL — ABNORMAL HIGH (ref 1.7–7.7)

## 2010-11-04 LAB — CBC
Hemoglobin: 12.7 g/dL (ref 12.0–15.0)
MCHC: 34.6 g/dL (ref 30.0–36.0)
Platelets: 256 10*3/uL (ref 150–400)
RDW: 12.4 % (ref 11.5–15.5)

## 2010-11-04 LAB — STREP B DNA PROBE: Strep Group B Ag: NEGATIVE

## 2010-11-04 LAB — GC/CHLAMYDIA PROBE AMP, GENITAL: Chlamydia, DNA Probe: NEGATIVE

## 2010-11-04 LAB — WET PREP, GENITAL: Clue Cells Wet Prep HPF POC: NONE SEEN

## 2010-11-04 LAB — RPR: RPR Ser Ql: NONREACTIVE

## 2010-12-16 NOTE — Op Note (Signed)
Teresa Day, Teresa Day            ACCOUNT NO.:  [id number]   MEDICAL RECORD NO.:  [id number]          PATIENT TYPE:  INP   LOCATION:  9116                          [facility]ILITY:  [facility]   PHYSICIAN:  Ginger Carne, MD  DATE OF BIRTH:  27-Jun-1981   DATE OF PROCEDURE:  03/10/2007  DATE OF DISCHARGE:                               OPERATIVE REPORT   PREOPERATIVE DIAGNOSES:  1. Previous cesarean section.  2. Attempted vaginal birth after cesarean section.  3. Arrest of descent.  4. Occiput-posterior presentation.   POSTOPERATIVE DIAGNOSES:  1. Previous cesarean section.  2. Attempted vaginal birth after cesarean section.  3. Arrest of descent.  4. Occiput-posterior presentation.  5. Viable female infant.   PROCEDURE:  Repeat low transverse cesarean section after vaginal birth  after cesarean attempt.   SURGEON:  Ginger Carne, M.D.   ASSISTANT:  Karlton Lemon, M.D.   ANESTHESIA:  Epidural.   FINDINGS:  1. Term infant female delivered in occiput posterior position, vertex,      weighing 6 pounds, 10 ounces.  Apgars of 8 at one minute and 9 at      five minutes.  2. Uterus, tubes, and ovaries showing normal decidual changes of      pregnancy.   ESTIMATED BLOOD LOSS:  600 ml.   DRAINS:  Foley with 100 out.   LINES:  An IV with 1100 ml in.   COMPLICATIONS:  None immediate.   DESCRIPTION OF PROCEDURE:  Patient was taken to the operating room and  was prepped and draped in the usual sterile fashion in the supine  position with the left lateral uterine displacement.  After insuring  adequate anesthesia, a Pfannenstiel skin incision was made using the  scalpel.  The incision was carried down through the subcutaneous tissues  using the scalpel.  The rectus fascia was nicked in the midline, and the  incision was extended laterally in each direction using Mayo scissors.  The parietal peritoneum was identified, grasped between two hemostats,  elevated, and incised with  scissors under direct visualization.  The  bladder blade was placed.  The uterus was visualized.  A low transverse  uterine incision was made using the scalpel, and this incision was  extended laterally and superiorly using blunt dissection and bandage  scissors.  A hand was placed within the uterine cavity, used to elevate  and flex the head of the infant who was in the occiput posterior  position.  The head of the infant was delivered without difficulty, and  the infant was bulb-suctioned after delivery of the head.  The body was  then delivered without difficulty.  The cord was doubly clamped and cut,  and the infant handed to the nursery team in attendance.  Cord blood was  collected.  The placenta was delivered manually and appeared intact.  Due to scar tissue, the uterus was unable to be exteriorized.  The  uterine incision was clamped at the edges and superiorly and inferiorly  with ring clamps.  The uterine incision was closed in a running locked  fashion with 0 Vicryl.  The area was  inspected, and the right lateral  edge was shown to be bleeding.  A vertical mattress suture was placed in  the area of bleeding at the right lateral edge of the uterine incision.  With this, hemostasis was obtained.  The uterine incision was inspected  and found to have adequate hemostasis.  The rectus fascia was  approximated with two sutures of 0 Vicryl beginning laterally on the  left.  A second suture was begun in the midline and extended laterally  to the right.  Small areas of bleeding in the subcutaneous tissue were  controlled using the Bovie cautery.  The skin was approximated with  stainless steel skin staples.  Sponge and needle counts were correct x2.  The patient tolerated the procedure well and went to recovery in stable  condition.      Karlton Lemon, MD      Ginger Carne, MD  Electronically Signed    NS/MEDQ  D:  03/10/2007  T:  03/10/2007  Job:  [id number]

## 2010-12-19 NOTE — Op Note (Signed)
[facility]  Patient:    Teresa Day, Teresa Day                     MRN: [id number] Proc. Date: 05/09/00 Adm. Date:  [id number] Disc. Date: [id number] Attending:  Antionette Char                           Operative Report  PREOPERATIVE DIAGNOSIS:       Arrest of descent, probable persistent occiput                               posterior position.  POSTOPERATIVE DIAGNOSIS:      Arrest of descent, probable persistent occiput                               posterior position, and persistent right occiput                               posterior position.  OPERATION:                    Primary low transverse cesarean section.  SURGEON:                      Charles A. Clearance Coots, M.D.  ASSISTANT:                    Andrey Spearman, M.D.  ANESTHESIA:                   Epidural.  ESTIMATED BLOOD LOSS:         800 ml.  IV FLUIDS:                    2200 ml.  URINE OUTPUT:                 225 ml, clear.  COMPLICATIONS:                None.  FOLEY:                        To gravity.  FINDINGS:                     A viable female at 37 with Apgars of 8 at one minute and 9 at five minutes.  Weight of 8 pounds.  Normal uterus, ovaries, and fallopian tubes.  DESCRIPTION OF PROCEDURE:     The patient was brought to the operating room and after a satisfactory redosing of the epidural, the abdomen was prepped and draped in the usual sterile fashion.  A Pfannenstiel skin incision was made with the scalpel that was deepened down to the fascia with the scalpel.  The fascia was nicked in the midline and the fascial incision was extended to the left and to the right with curved Mayo scissors.  The superior and inferior fascial edges were taken off of the rectus muscle with both blunt and sharp dissection.  The rectus muscle was bluntly divided in the midline and the peritoneum was entered distally, and was digitally extended to the left and to the right.  The bladder  blade was positioned and the vesicouterine fold of the peritoneum  above the reflection of the urinary bladder was grasped with forceps, and was incised and undermined with Metzenbaum scissors.  The incision was extended to the left and to the right with the Metzenbaum scissors.  The bladder flap was bluntly developed and the bladder blade was repositioned in front of the urinary bladder, placing it well out of the operative field.  The uterus was entered with sharp strokes of the scalpel.  A moderate amount of clear fluid was expelled.  The uterine incision was extended to the left and to the right in a smile configuration with the bandage scissors.  The vertex was then noted to be right occiput posterior and was rotated to the occiput anterior position, and was delivered with the aid of fundal pressure from the assistant.  The infants mouth and nose were suctioned with the suction bulb, and the delivery was completed with the aid of fundal pressure from the assistant.  The umbilical cord was doubly clamped and cut, and the infant was handed off to the nursing staff.                                Cord blood was obtained and the placenta was spontaneously expelled from the uterine cavity intact.  The uterus was exteriorized and the endometrial surface was thoroughly debrided with a dry lap sponge.  The edges of the uterine incision were grasped with ring forceps and the uterus was closed with a continuous interlocking suture of 0 Monocryl from each corner to the center.  Hemostasis was excellent.  The uterus was placed back in its normal anatomic position.  The pelvic cavity was thoroughly irrigated with warm saline solution and all clots were removed.  The closure of the uterus was again observed for hemostasis and no active bleeding was noted.  The abdomen was then closed as follows; the fascia was closed with a continuous suture of 0 PDS from each corner to the center.  The  subcutaneous tissue was thoroughly irrigated with warm saline solution, and all areas of subcutaneous bleeding were coagulated with the Bovie.  The skin was then approximated with surgical stainless steel staples.  A sterile bandage was applied to the incision closure.                                The surgical technician indicated that all needle, sponge, and instrument counts were correct.  The patient tolerated the procedure well and was transported to the recovery room in satisfactory condition. DD:  05/09/00 TD:  05/10/00 Job: [id number]

## 2010-12-19 NOTE — Discharge Summary (Signed)
NAMEHERMENA, Teresa Day            ACCOUNT NO.:  [id number]   MEDICAL RECORD NO.:  [id number]          PATIENT TYPE:  INP   LOCATION:  9116                          [facility]   PHYSICIAN:  Lazaro Arms, M.D.   DATE OF BIRTH:  11/10/1980   DATE OF ADMISSION:  03/09/2007  DATE OF DISCHARGE:  03/13/2007                               DISCHARGE SUMMARY   The patient was admitted on March 09, 2007, in active labor with a  strong desire for a trial of labor after previously having a C-section.  On admission the patient was a 30 year old G2, P1-0-0-1 at 12 weeks and  2 days' gestation, who had spontaneous rupture of membranes and was  having regular contractions.  Her obstetrical history was significant  for a previous cesarean section in 2001, of a female infant for probable  OP positioning. Risks and benefits of a trial of labor were reviewed  extensively with the patient and a consent form was signed to proceed  with a trial of labor.  On March 10, 2007, the decision was made to  proceed with a repeat low-transverse cesarean section secondary to  arrest of descent and occiput posterior presentation.  On the 7th, a term female infant weighing 6 pounds and 10 ounces with  Apgar's of 8 and 9, respectively, was delivered via repeat low-  transverse C-section by Dr. Blima Rich and Dr. Karlton Lemon.  Please see the dictated operative note for the details of this surgery.   This patient proceeded in a normal postoperative and post partum course  and remained afebrile throughout her stay and her vital signs were  stable as well as her laboratory evaluation.  The patient was discharged  on postoperative day #3 on March 13, 2007, at a 30 year old gravida 2,  para 2-0-0-2, on postoperative day #3, status post repeat low-transverse  C-section after a failed trial of labor.  The patient was discharged  home with her female infant, breast and bottle feeding.   She was given a prescription  for:  1. Micronor for contraception.  2. Motrin 600 mg p.o. q.6 h. p.r.n. cramping.  3. Percocet 1-2 tablets q.4-6 h. p.r.n. pain.  4. Prenatal vitamins 1 p.o. daily.   She was instructed to follow up with Dr. Melynda Ripple at the [facility] for her staple removal on the following Thursday.      Maylon Cos, C.N.M.      Lazaro Arms, M.D.  Electronically Signed    SS/MEDQ  D:  05/23/2007  T:  05/23/2007  Job:  [id number]

## 2011-05-18 LAB — CBC
HCT: 32.7 — ABNORMAL LOW
Hemoglobin: 11.2 — ABNORMAL LOW
MCHC: 34.2
MCHC: 34.4
MCV: 85.9
Platelets: 216
Platelets: 263
RDW: 15.2 — ABNORMAL HIGH
WBC: 15 — ABNORMAL HIGH

## 2011-05-18 LAB — RPR: RPR Ser Ql: NONREACTIVE

## 2011-05-18 LAB — TYPE AND SCREEN: ABO/RH(D): B POS

## 2011-11-04 ENCOUNTER — Ambulatory Visit (INDEPENDENT_AMBULATORY_CARE_PROVIDER_SITE_OTHER): Payer: Self-pay | Admitting: Family Medicine

## 2011-11-04 ENCOUNTER — Encounter: Payer: Self-pay | Admitting: Family Medicine

## 2011-11-04 VITALS — BP 111/71 | HR 75 | Temp 98.0°F | Ht 59.0 in | Wt 166.0 lb

## 2011-11-04 DIAGNOSIS — IMO0002 Reserved for concepts with insufficient information to code with codable children: Secondary | ICD-10-CM

## 2011-11-04 NOTE — Assessment & Plan Note (Signed)
Discussed at length with patient that first-line treatment for this should be lubrication during sexual intercourse. Discussed the potential of either oral sex or buying personal lubrication that she can get at most retail stores. Patient will try this and followup with primary care provider in 2 weeks if not better.

## 2011-11-04 NOTE — Patient Instructions (Signed)
Dyspareunia Dyspareunia is pain during sexual intercourse. It is most common in women, but it also happens in men.  CAUSES  Female The pain from this condition is usually felt when anything is put into the vagina, but any part of the genitals may cause pain during sex. Even sitting or wearing pants can cause pain. Sometimes, a cause cannot be found. Some causes of pain during intercourse are:  Infections of the skin around the vagina.   Vaginal infections, such as a yeast, bacterial, or viral infection.   Vaginismus. This is the inability to have anything put in the vagina even when the woman wants it to happen. There is an automatic muscle contraction and pain. The pain of the muscle contraction can be so severe that intercourse is impossible.   Allergic reaction from spermicides, semen, condoms, scented tampons, soaps, douches, and vaginal sprays.   A fluid-filled sac (cyst) on the Bartholin or Skene glands, located at the opening of the vagina.   Scar tissue in the vagina from a surgically enlarged opening (episiotomy) or tearing after delivering a baby.   Vaginal dryness. This is more common in menopause. The normal secretions of the vagina are decreased. Changes in estrogen levels and increased difficulty becoming aroused can cause painful sex. Vaginal dryness can also happen when taking birth control pills.   Thinning of the tissue (atrophy) of the vulva and vagina. This makes the area thinner, smaller, unable to stretch to accommodate a penis, and prone to infection and tearing.   Vulvar vestibulitis or vestibulodynia.This is a condition that causes pain involving the area around the entrance to the vagina.The most common cause in young women is birth control pills.Women with low estrogen levels (postmenopausal women) may also experience this.Other causes include allergic reactions, too many nerve endings, skin conditions, and pelvic muscles that cannot relax.   Vulvar dermatoses.  This includes skin conditions such as lichen sclerosus and lichen planus.   Lack of foreplay to lubricate the vagina. This can cause vaginal dryness.   Noncancerous tumors (fibroids) in the uterus.   Uterus lining tissue growing outside the uterus (endometriosis).   Pregnancy that starts in the fallopian tube (tubal pregnancy).   Pregnancy or breastfeeding your baby. This can cause vaginal dryness.   A tilting or prolapse of the uterus. Prolapse is when weak and stretched muscles around the uterus allow it to fall into the vagina.   Problems with the ovaries, cysts, or scar tissue. This may be worse with certain sexual positions.   Previous surgeries causing adhesions or scar tissue in the vagina or pelvis.   Bladder and intestinal problems.   Psychological problems (such as depression or anxiety). This may make pain worse.   Negative attitudes about sex, experiencing rape, sexual assault, and misinformation about sex. These issues are often related to some types of pain.   Previous pelvic infection, causing scar tissue in the pelvis and on the female organs.   Cyst or tumor on the ovary.   Cancer of the female organs.   Certain medicines.   Medical problems such as diabetes, arthritis, or thyroid disease.  Female In men, there are many physical causes of sexual discomfort. Some causes of pain during intercourse are:  Infections of the prostate, bladder, or seminal vesicles. This can cause pain after ejaculation.   An inflamed bladder (interstitial cystitis). This may cause pain from ejaculation.   Gonorrheal infections. This may cause pain during ejaculation.   An inflamed urethra (urethritis) or inflamed  prostate (prostatitis). This can make genital stimulation painful or uncomfortable.   Deformities of the penis, such as Peyronie's disease.   A tight foreskin.   Cancer of the female organs.   Psychological problems. This may make pain worse.  DIAGNOSIS   Your  caregiver will take a history and have you describe where the pain is located (outside the vagina, in the vagina, in the pelvis). You may be asked when you experience pain, such as with penetration or with thrusting.   Following this, your caregiver will do a physical exam. Let your caregiver know if the exam is too painful.   During the final part of the female exam, your caregiver will feel your uterus and ovaries with one hand on the abdomen and one finger in your vagina. This is a pelvic exam.   Blood tests, a Pap test, cultures for infection, an ultrasound test, and X-rays may be done. You may need to see a specialist for female problems (gynecologist).   Your caregiver may do a CT scan, MRI, or laparoscopy. Laparoscopy is a procedure to look into the pelvis with a lighted tube, through a cut (incision) in the abdomen.  TREATMENT  Your caregiver can help you determine the best course of treatment. Sometimes, more testing is done. Continue with the suggested testing until your caregiver feels sure about your diagnosis and how to treat it. Sometimes, it is difficult to find the reason for the pain. The search for the cause and treatment can be frustrating. Treatment often takes several weeks to a few months before you notice any improvement. You may also need to avoid sexual activity until symptoms improve.Continuing to have sex when it hurts can delay healing and actually make the problem worse. The treatment depends on the cause of the pain. Treatment may include:  Medicines such as antibiotics, vaginal or skin creams, hormones, or antidepressants.   Minor or major surgery.   Psychological counseling or group therapy.   Kegel exercises and vaginal dilators to help certain cases of vaginismus (spasms). Do this only if recommended by your caregiver.Kegel exercises can make some problems worse.   Applying lubrication as recommended by your caregiver if you have dryness.   Sex therapy for  you and your sex partner.  It is common for the pain to continue after the reason for the pain has been treated. Some reasons for this include a conditioned response. This means the person having the pain becomes so familiar with the pain that the pain continues as a response, even though the cause is removed. Sex therapy can help with this problem. HOME CARE INSTRUCTIONS   Follow your caregiver's instructions about taking medicines, tests, counseling, and follow-up treatment.   Do not use scented tampons, douches, vaginal sprays, or soaps.   Use water-based lubricants for dryness. Oil lubricants can cause irritation.   Do not use spermicides or condoms that irritate you.   Openly discuss with your partner your sexual experience, your desires, foreplay, and different sexual positions for a more comfortable and enjoyable sexual relationship.   Join group sessions for therapy, if needed.   Practice safe sex at all times.   Empty your bladder before having intercourse.   Try different positions during sexual intercourse.   Take over-the-counter pain medicine recommended by your caregiver before having sexual intercourse.   Do not wear pantyhose. Knee-high and thigh-high hose are okay.   Avoid scrubbing your vulva with a washcloth. Wash the area gently and pat dry   with a towel.  SEEK MEDICAL CARE IF:   You develop vaginal bleeding after sexual intercourse.   You develop a lump at the opening of your vagina, even if it is not painful.   You have abnormal vaginal discharge.   You have vaginal dryness.   You have itching or irritation of the vulva or vagina.   You develop a rash or reaction to your medicine.  SEEK IMMEDIATE MEDICAL CARE IF:   You develop severe abdominal pain during or shortly after sexual intercourse. You could have a ruptured ovarian cyst or ruptured tubal pregnancy.   You have a fever.   You have painful or bloody urination.   You have painful sexual  intercourse, and you never had it before.   You pass out after having sexual intercourse.  Document Released: 08/09/2007 Document Revised: 07/09/2011 Document Reviewed: 10/20/2010 [facility] Patient Information 2012 [place], [place].

## 2011-11-04 NOTE — Progress Notes (Signed)
  Subjective:    Patient ID: Teresa Day, female    DOB: Aug 15, 1980, 31 y.o.   MRN: [id number]  HPI 31 year old female Spanish-speaking only here with dyspareunia patient states that this started approximately 2 months ago. Patient denies any new medications is taking some prenatal vitamins but otherwise nothing has changed. Patient is still with the same partner. Patient would like to have another child but is having trouble having intercourse because it hurts. Patient denies any bleeding any weight changes or any fevers or chills. Patient has been seen by health department over the course of the last 2 months and has been tested for sexually transmitted diseases and they have all been negative. Patient would like to avoid another pelvic exam if possible. Has not tried any lubrication. No oral sex prior to intercourse.  Review of Systems Denies fever, chills, nausea vomiting abdominal pain any vaginal discharge.    Objective:   Physical Exam General: No apparent distress overweight female Abdominal exam: Bowel sounds positive nontender nondistended uterus not palpated Pelvic exam deferred.     Assessment & Plan:

## 2011-11-16 ENCOUNTER — Other Ambulatory Visit (HOSPITAL_COMMUNITY)
Admission: RE | Admit: 2011-11-16 | Discharge: 2011-11-16 | Disposition: A | Discharge status: Home / Self Care | Payer: Self-pay | Source: Ambulatory Visit | Attending: Family Medicine | Admitting: Family Medicine

## 2011-11-16 ENCOUNTER — Ambulatory Visit (INDEPENDENT_AMBULATORY_CARE_PROVIDER_SITE_OTHER): Payer: Self-pay | Admitting: Family Medicine

## 2011-11-16 ENCOUNTER — Encounter: Payer: Self-pay | Admitting: Family Medicine

## 2011-11-16 VITALS — BP 124/80 | HR 88 | Ht 59.0 in | Wt 165.0 lb

## 2011-11-16 DIAGNOSIS — N898 Other specified noninflammatory disorders of vagina: Secondary | ICD-10-CM

## 2011-11-16 DIAGNOSIS — Z113 Encounter for screening for infections with a predominantly sexual mode of transmission: Secondary | ICD-10-CM | POA: Insufficient documentation

## 2011-11-16 LAB — POCT WET PREP (WET MOUNT): Clue Cells Wet Prep Whiff POC: NEGATIVE

## 2011-11-16 MED ORDER — FLUCONAZOLE 150 MG PO TABS: ORAL_TABLET | ORAL | Status: DC

## 2011-11-16 NOTE — Progress Notes (Signed)
  Subjective:    Patient ID: Teresa Day, female    DOB: 1981/06/13, 31 y.o.   MRN: [id number]  HPI Patient visit conducted in Spanish.  Patient seen here 11 days ago for complaint of dyspareunia; was given recommendation for vaginal lubricant with intercourse, which has not helped.  Complaint of thin malodorous vaginal discharge and vaginal mucosa irritation.  Denies dysuria or polyuria, or changes in the urine appearance or odor.  Prior diagnosis "yeast" in Feb 2013 at [facility], treated with one-time med (?diflucan).  Sexually active with spouse of several years.  LMP October 26, 2011, no contraception.  Trying actively to conceive for past 3 months, distraught that she has not yet conceived.  G4P2, s/p SAB x 2.  Youngest daughter 28 years old.    Review of Systems  Low back pain that radiates to posterior thigh, interior thigh.  No back trauma. Some weight gain which she ascribes to eating out with friends (gained 25# in past 6 months)     Objective:   Physical Exam Alert, no apparent distress. Emotional lability HEENT Neck supple. THyroid non-nodular. No cervical adenopathy COR S1S2, no extra sounds PULM Clear bilaterally, no rales or wheezes GYN Vaginal mucosa unremarkable.  Cervix without lesions and non-friable; clear mucinous discharge from os. Bimanual exam without CMT or adnexal tenderness.  MSK> Straight leg raise unremarkable.       Assessment & Plan:

## 2011-11-16 NOTE — Patient Instructions (Signed)
Fue un placer verle hoy; despues de los estudios hechos hoy en nuestro laboratorio, no se ha identificado por cierto a la causa de sus sintomas.    Estoy mandando un cultivo de la [place] de hoy, le llamo al [phone] con los [place] en cuando los tenga.   Por favor tome la medicina recetada (fluconazole 150mg ) una tableta por boca, una hoy y la segunda en 7 dias.    RECORDS RELEASE FOR [place] TO OBTAIN RESULTS OF PAP/CERVICAL CULTURES/WET PREP/ BLOOD WORK FROM [facility] DEPT, [address].

## 2011-11-16 NOTE — Assessment & Plan Note (Signed)
Thin vaginal discharge on exam without CMT; wet prep not revealing specific cause.  Cervical cultures sent today.  Treatment empirically for vaginal yeast with diflucan weekly x2.  To follow with her on results of culture by phone.

## 2011-11-18 ENCOUNTER — Telehealth: Payer: Self-pay | Admitting: Family Medicine

## 2011-11-18 NOTE — Telephone Encounter (Signed)
Called patient to report results of cervical cultures (negative for GC and Chlamydia).  She reports feeling much better after taking single dose fluconazole.  Will call back if not better.  Phone conversation conducted in [place].   Paula Compton, MD

## 2011-12-18 ENCOUNTER — Ambulatory Visit (INDEPENDENT_AMBULATORY_CARE_PROVIDER_SITE_OTHER): Payer: Self-pay | Admitting: Family Medicine

## 2011-12-18 ENCOUNTER — Encounter: Payer: Self-pay | Admitting: Family Medicine

## 2011-12-18 VITALS — BP 106/82 | HR 78 | Temp 98.3°F | Ht 59.0 in | Wt 171.0 lb

## 2011-12-18 DIAGNOSIS — N926 Irregular menstruation, unspecified: Secondary | ICD-10-CM

## 2011-12-18 DIAGNOSIS — N912 Amenorrhea, unspecified: Secondary | ICD-10-CM

## 2011-12-18 LAB — POCT URINE PREGNANCY: Preg Test, Ur: NEGATIVE

## 2011-12-18 NOTE — Assessment & Plan Note (Addendum)
Patient with one month of missed menses. She is very concerned that she may be pregnant. We reviewed that stress can cause changes in hormones. I have asked her to keep a diary of her periods and return to clinic in 3 months. I have also told her that she can take over-the-counter pregnancy tests to confirm a pregnancy. I told her that we could discuss birth control options besides condoms if she did not want to have a baby. I told her that this should be a choice between her and her husband not just his choice.

## 2011-12-18 NOTE — Progress Notes (Signed)
  Subjective:    Patient ID: Teresa Day, female    DOB: 02/22/1981, 31 y.o.   MRN: [id number]  HPI Patient seen with [employer] interpreting. She is very frustrated and emotional. Her last period was in March. She is concerned because she has had 2 miscarriages and was told in the past to come see a doctor as soon as she missed a period. She thinks that something is wrong because she has missed her menstrual cycle.  She and her husband have been trying to conceive for 3 months. Before that she was using condoms for protection. She seems to be very conflicted over whether not she wants to get pregnant. She says that she does not want to go through the risk and the interventions but her husband really wants to have more children.   Review of Systems No discharge, abdominal pain today.    Objective:   Physical Exam Gen.-tearful, frustrated.       Assessment & Plan:

## 2011-12-31 ENCOUNTER — Ambulatory Visit (INDEPENDENT_AMBULATORY_CARE_PROVIDER_SITE_OTHER): Payer: Self-pay | Admitting: Family Medicine

## 2011-12-31 VITALS — BP 128/84 | HR 80 | Temp 98.5°F | Ht 59.0 in | Wt 170.0 lb

## 2011-12-31 DIAGNOSIS — R109 Unspecified abdominal pain: Secondary | ICD-10-CM

## 2011-12-31 NOTE — Patient Instructions (Signed)
El reflujo: Zantac 2 veces hasta su proxima cita con Dr. Hulen Luster  Enstrenimiento: fibercon o otra fiber supplement- cada dia miralax- cada dia  necesita fijar una cita con su doctora en 1 mes para revisar.

## 2011-12-31 NOTE — Progress Notes (Signed)
  Subjective:    Patient ID: Teresa Day, female    DOB: [birth year], 31 y.o.   MRN: [id number]  HPI Abdominal pain: Patient endorses epigastric abdominal pain but then goes down into lower abdomen after she eats. Avoiding food seems to make this pain better. Only occurs postprandial. No nausea. No vomiting. No diarrhea. Positive constipation-for many years. Has had some problems with reflux in the past. No fever. Recently had yeast infection diagnosis-no further vaginal discharge. Also had miscarriage a few months ago. And had recent 2 months of amenorrhea-that lasted for. Started on 5/23. Pregnancy test has been negative. GC Chlamydia and wet prep on negative at visit in April. No back pain. No urinary problems. No dysuria. No urinary frequency. No urinary retention.  Smoking status reviewed.  Review of Systems As per above.    Objective:   Physical Exam  Constitutional: She appears well-developed and well-nourished.  HENT:  Head: Normocephalic and atraumatic.  Eyes: Pupils are equal, round, and reactive to light. Right eye exhibits no discharge. Left eye exhibits no discharge.  Neck: Normal range of motion. No thyromegaly present.  Cardiovascular: Normal rate, regular rhythm and normal heart sounds.   No murmur heard. Pulmonary/Chest: Effort normal. No respiratory distress. She has no wheezes.  Abdominal: Soft. She exhibits no distension and no mass. There is no tenderness. There is no rebound and no guarding.  Musculoskeletal: She exhibits no edema.  Neurological: She is alert.  Skin: No rash noted.  Psychiatric: She has a normal mood and affect. Her behavior is normal.          Assessment & Plan:

## 2012-01-04 DIAGNOSIS — R109 Unspecified abdominal pain: Secondary | ICD-10-CM | POA: Insufficient documentation

## 2012-01-04 NOTE — Assessment & Plan Note (Addendum)
Cause of intermittent abdominal pain unclear.  Abdominal pain is atypical in that it is epigastric at times and sometimes in lower abd.  Pt has h/o reflux and is on nothing for this at this time- also long h/o constipation.  I think most likely abd pain  (that isn't present during exam today) may be coming from a combination of these 2 factors.  Only occuring post prandial seems to support reflux.  Will first try patient on ppi and bowel regimen to see if this improves.  If no improvement or if new or worsening of symptoms could consider doing abdominal u/s to r/o gallbladder issue.  No signs of pelvic infection- recent negative work up for this.  Pt to return in 1 month for recheck or sooner if needed for new or worsening of symptoms.

## 2012-03-14 ENCOUNTER — Encounter: Payer: Self-pay | Admitting: Family Medicine

## 2012-03-14 ENCOUNTER — Ambulatory Visit (INDEPENDENT_AMBULATORY_CARE_PROVIDER_SITE_OTHER): Payer: Self-pay | Admitting: Family Medicine

## 2012-03-14 VITALS — BP 113/77 | HR 69 | Ht 59.0 in | Wt 171.3 lb

## 2012-03-14 DIAGNOSIS — Z331 Pregnant state, incidental: Secondary | ICD-10-CM

## 2012-03-14 DIAGNOSIS — N912 Amenorrhea, unspecified: Secondary | ICD-10-CM

## 2012-03-14 LAB — POCT URINE PREGNANCY: Preg Test, Ur: POSITIVE

## 2012-03-14 MED ORDER — PRENATAL VITAMINS 0.8 MG PO TABS: 1.0000 | ORAL_TABLET | Freq: Every day | ORAL | Status: DC

## 2012-03-14 NOTE — Progress Notes (Signed)
Interpreter Natayah Warmack [facility] 

## 2012-03-14 NOTE — Progress Notes (Signed)
Subjective: The patient is a 31 y.o. year old female who presents today for amenorrhea.  1. Amenorrhea: Patient reports LMP was 12/24/11.  Prior to that LMP was about 3 month before.  Prior to this had regular periods.  Is in a stable relationship and desires pregnancy.  Has two children, ages 34 and 70.  She has lived here for 12 years.  Patient reports that she has had problems with weight gain for the last 6 months along with nausea and anxiety for the last 3 months.  She reports some joint aches and possibly some hair loss (although she has always had that).  Patient's past medical, social, and family history were reviewed and updated as appropriate. History  Substance Use Topics  . Smoking status: Never Smoker   . Smokeless tobacco: Not on file  . Alcohol Use: Not on file   Objective:  Filed Vitals:   03/14/12 1347  BP: 113/77  Pulse: 69   Gen: NAD, obese CV: RRR, no murmurs Resp: CTABL Abd: SNTND, uterus not palpable Ext: No edema  Assessment/Plan:  Please also see individual problems in problem list for problem-specific plans.

## 2012-03-14 NOTE — Assessment & Plan Note (Addendum)
With two miscarriages at ~59months, second one on 17P, and with report of talk of cerclage after second miscarriage, will need to be evaluated by [facility].  Patient given an appointment with them and also instructed to call adopt a mom for pregnancy coverage.  Will defer any lab testing/imaging until approved by adopt a mom.  Patient has NOB appointment with high risk at end of month.

## 2012-03-14 NOTE — Patient Instructions (Signed)
Call [employer] at [phone]. Someone from [facility] or our clinic will be in touch with you in the next 2-3 days.  Call us if you haven't heard from [place] by Thursday.

## 2012-03-18 ENCOUNTER — Encounter (HOSPITAL_COMMUNITY): Payer: Self-pay | Admitting: *Deleted

## 2012-03-18 ENCOUNTER — Inpatient Hospital Stay (HOSPITAL_COMMUNITY)
Admission: AD | Admit: 2012-03-18 | Discharge: 2012-03-18 | Disposition: A | Discharge status: Home / Self Care | Payer: Self-pay | Source: Ambulatory Visit | Attending: Obstetrics & Gynecology | Admitting: Obstetrics & Gynecology

## 2012-03-18 ENCOUNTER — Inpatient Hospital Stay (HOSPITAL_COMMUNITY): Payer: Self-pay

## 2012-03-18 DIAGNOSIS — O09899 Supervision of other high risk pregnancies, unspecified trimester: Secondary | ICD-10-CM

## 2012-03-18 DIAGNOSIS — M5430 Sciatica, unspecified side: Secondary | ICD-10-CM

## 2012-03-18 DIAGNOSIS — M545 Low back pain, unspecified: Secondary | ICD-10-CM | POA: Insufficient documentation

## 2012-03-18 DIAGNOSIS — O99891 Other specified diseases and conditions complicating pregnancy: Secondary | ICD-10-CM | POA: Insufficient documentation

## 2012-03-18 DIAGNOSIS — M5440 Lumbago with sciatica, unspecified side: Secondary | ICD-10-CM

## 2012-03-18 LAB — URINALYSIS, ROUTINE W REFLEX MICROSCOPIC
Bilirubin Urine: NEGATIVE
Ketones, ur: NEGATIVE mg/dL
Protein, ur: NEGATIVE mg/dL
Urobilinogen, UA: 0.2 mg/dL (ref 0.0–1.0)

## 2012-03-18 LAB — URINE MICROSCOPIC-ADD ON

## 2012-03-18 NOTE — MAU Note (Signed)
C/o back pain for past week

## 2012-03-18 NOTE — MAU Provider Note (Signed)
History   Teresa Day is a 31 y.o. [phone] female at [redacted]w[redacted]d by LMP who presents with onset of constant achy low back/buttocks pain that occasionally radiates into bilateral legs x 1 week.  Pain better upon standing or laying on back w/ legs in air. Hasn't tried any other relief measures at home.   Had same pain with previous pregnancies.  Reports small amount white non-odorous vaginal discharge. Denies abdominal pain/cramping, VB, or LOF. Was seen x 1 at [facility] office, went in for OCPs and found out about pregnancy.  Has appt w/ [facility] next Wed d/t h/o previous c/s x 2.  CSN: [id number]  Arrival date and time: 03/18/12 1023   None     Chief Complaint  Patient presents with  . Back Pain   Back Pain This is a new (had same pain in previous pregnancies) problem. The current episode started in the past 7 days. The problem occurs constantly (pain goes away when standing). The problem is unchanged. The pain is present in the lumbar spine and gluteal. The quality of the pain is described as aching. Radiates to: occasionally shoots down bilateral legs. The pain is at a severity of 5/10. The pain is moderate. The pain is the same all the time. The symptoms are aggravated by lying down and sitting. Stiffness is present: none. Associated symptoms include tingling (occasional tingling Rt leg). Pertinent negatives include no abdominal pain, dysuria, fever, pelvic pain or weakness. Risk factors include pregnancy. Treatments tried: laying on back with feet up in air helps. The treatment provided significant relief.    OB History    Grav Para Term Preterm Abortions TAB SAB Ect Mult Living   6 2 2  2            Past Medical History  Diagnosis Date  . Anxiety     Past Surgical History  Procedure Date  . Cesarean section     Family History  Problem Relation Age of Onset  . Other Neg Hx     History  Substance Use Topics  . Smoking status: Never Smoker   . Smokeless tobacco:  Not on file  . Alcohol Use: No    Allergies: No Known Allergies  Prescriptions prior to admission  Medication Sig Dispense Refill  . Prenatal Multivit-Min-Fe-FA (PRENATAL VITAMINS) 0.8 MG tablet Take 1 tablet by mouth daily.  30 tablet  9    Review of Systems  Constitutional: Negative.  Negative for fever.  HENT: Negative.   Eyes: Negative.   Respiratory: Negative.   Cardiovascular: Negative.   Gastrointestinal: Negative.  Negative for abdominal pain.  Genitourinary: Negative.  Negative for dysuria, urgency, frequency, flank pain and pelvic pain.  Musculoskeletal: Positive for back pain.  Skin: Negative.   Neurological: Positive for tingling (occasional tingling Rt leg). Negative for weakness.  Endo/Heme/Allergies: Negative.   Psychiatric/Behavioral: Negative.    Physical Exam   Blood pressure 115/76, pulse 85, temperature 98.6 F (37 C), temperature source Oral, resp. rate 18, height 4\' 11"  (1.499 m), weight 76.828 kg (169 lb 6 oz), last menstrual period 12/24/2011, unknown if currently breastfeeding.  Physical Exam  Constitutional: She is oriented to person, place, and time. She appears well-developed and well-nourished.  HENT:  Head: Normocephalic.  Eyes: Pupils are equal, round, and reactive to light.  Neck: Normal range of motion.  Cardiovascular: Normal rate, regular rhythm and normal heart sounds.   Respiratory: Effort normal and breath sounds normal.  GI: Soft.  Musculoskeletal: Normal range of  motion.  Neurological: She is alert and oriented to person, place, and time. She has normal reflexes.  Skin: Skin is warm and dry.  Psychiatric: She has a normal mood and affect. Her behavior is normal. Judgment and thought content normal.   Unable to obtain fht via doppler  MAU Course  Procedures  MDM Complete OB U/S for viability, dates: [redacted]w[redacted]d by [facility], fhr 170   [place], [place]  Home Medication Instructions [id number]   Printed on:03/18/12 1419    Medication Information                    Prenatal Multivit-Min-Fe-FA (PRENATAL VITAMINS) 0.8 MG tablet Take 1 tablet by mouth daily.            Follow-up Information    Follow up with [facility] on 03/23/2012. (as scheduled.  Return to hospital  as needed or  if symptoms worsen)    Contact information:   [address] [place] [address] [phone]          Assessment and Plan  A:  [redacted]w[redacted]d [facility]  Low back pain of pregnancy w/ sciatica component  Previous c/s x 2  P:  Tylenol prn  F/U w/ [facility] as scheduled on Wed 8/21 @ 9:45 for NOB visit   Marge Duncans, CNM 03/18/2012, 11:08 AM

## 2012-03-23 ENCOUNTER — Ambulatory Visit (INDEPENDENT_AMBULATORY_CARE_PROVIDER_SITE_OTHER): Payer: Self-pay | Admitting: Family Medicine

## 2012-03-23 ENCOUNTER — Other Ambulatory Visit: Payer: Self-pay | Admitting: Family Medicine

## 2012-03-23 ENCOUNTER — Encounter: Payer: Self-pay | Admitting: Family Medicine

## 2012-03-23 VITALS — BP 131/79 | Temp 98.0°F | Wt 168.3 lb

## 2012-03-23 DIAGNOSIS — O3680X0 Pregnancy with inconclusive fetal viability, not applicable or unspecified: Secondary | ICD-10-CM

## 2012-03-23 DIAGNOSIS — O0991 Supervision of high risk pregnancy, unspecified, first trimester: Secondary | ICD-10-CM

## 2012-03-23 DIAGNOSIS — O34219 Maternal care for unspecified type scar from previous cesarean delivery: Secondary | ICD-10-CM

## 2012-03-23 DIAGNOSIS — O09219 Supervision of pregnancy with history of pre-term labor, unspecified trimester: Secondary | ICD-10-CM

## 2012-03-23 DIAGNOSIS — Z331 Pregnant state, incidental: Secondary | ICD-10-CM

## 2012-03-23 LAB — POCT URINALYSIS DIP (DEVICE)
Bilirubin Urine: NEGATIVE
Glucose, UA: NEGATIVE mg/dL
Leukocytes, UA: NEGATIVE
Nitrite: NEGATIVE
Urobilinogen, UA: 0.2 mg/dL (ref 0.0–1.0)
pH: 6.5 (ref 5.0–8.0)

## 2012-03-23 NOTE — Patient Instructions (Signed)
Sport and exercise psychologist  (Preterm Labor) El parto prematuro comienza antes de la semana 37 de [place]. La duracin de un embarazo normal es de 39 a 41 semanas.  CAUSAS  Generalmente no hay una causa que pueda identificarse. Sin embargo, una de las causas conocidas ms frecuentes son las infecciones. Las infecciones del tero, el cuello, la vagina, el lquido [place], la vejiga, los riones y Teacher, adult education de los pulmones (neumona) pueden hacer que el trabajo de parto se inicie. Otras causas son:   Infecciones urogenitales, como infecciones por hongos y vaginosis bacteriana.   Anormalidades uterinas (forma del tero, sptum uterino, fibromas, hemorragias en la placenta).   Un cuello que ha sido operado y se abre prematuramente.   Malformaciones del beb.   Gestaciones mltiples (mellizos, trillizos y ms).   Ruptura del saco amnitico.  :Otros factores de riesgo del parto prematuro son   Historia previa de Sport and exercise psychologist.   Ruptura prematura de las [place].   La placenta cubre la apertura del cuello (placenta previa).   La placenta se separa del tero (abrupcin placentaria).   El cuello es demasiado dbil para contener al beb en el tero (cuello incompetente).   Hay mucho lquido en el saco amnitico (polihidramnios).   Consumo de drogas o hbito de fumar durante Firefighter.   No aumentar de peso lo suficiente durante el [employer].   Mujeres menores de 18 aos o [address] de [address] [place].   Nivel socioeconmico bajo.   Raza afroamericana.  SNTOMAS  Los signos y sntomas son:   Clicos del tipo menstrual   Contracciones con un intervalo entre 30 y 70 segundos, comienzan a ser regulares, se hacen ms frecuentes y se hacen ms intensas y dolorosas.   Contracciones que comienzan en la parte superior del tero y se expanden hacia abajo, hacia la zona inferior del abdomen y la espalda.   Sensacin de presin en la pelvis o dolor en la espalda.   Aparece una secrecin acuosa o  sanguinolenta por la vagina.  DIAGNSTICO  El diagnstico puede confirmarse:   Con un examen vaginal.   Ecografa del cuello.   Muestra (hisopado) de las secreciones crvico-vaginales. Estas muestras se analizan para buscar la presencia de fibronectina fetal. Esta protena que se encuentra en las secreciones del tero y se asocia con el parto prematuro.   Monitoreo fetal  TRATAMIENTO  Segn el tiempo del Psychiatrist y otras [place], el mdico puede indicar reposo en cama. Si es necesario, le indicarn medicamentos para TEFL teacher las contracciones y apurar la maduracin de los pulmones del feto. Si el trabajo de parto se inicia antes de las 34 semanas de [place], se recomienda la hospitalizacin. El tratamiento depende de las condiciones en que se encuentre la madre y el beb.  PREVENCIN  Hay algunas cosas que [employer] puede hacer para disminuir el riesgo de trabajo de parto prematuro en futuros [employer]. Una mam puede:   Dejar de fumar.   Mantener un peso saludable y evitar sustancias qumicas y drogas innecesarias.   Controlar todo tipo de infeccin.   Informar al mdico si tiene una historia conocida de parto prematuro.  Document Released: 10/27/2007 Document Revised: 07/09/2011 [facility] Patient Information 2012 [place], [place].  Embarazo - Primer trimestre (Pregnancy - First Trimester) Durante el acto sexual, millones de espermatozoides ingresan a la vagina. Slo Education administrator y Economist en vulo femenino mientras se encuentre en las [place]. Una semana despus, el huevo fertilizado se implanta en la pared del tero. Un  embrin comienza a desarrollarse y se convierte en un beb. Entre la 6 y la 8 semanas se forman los ojos y el rostro y el latido cardaco se detecta con Nurse, adult. Hacia el final de la 12 semana (primer trimestre) se han formado todos los rganos del beb. Ahora que est embarazada, desear hacer todo lo posible para tener un beb sano. Dos de  las cosas ms importantes son: Winferd Humphrey buena atencin prenatal y seguir las indicaciones del profesional que la asiste. La atencin prenatal incluye toda la asistencia mdica que usted recibe antes del nacimiento del beb. Se lleva a cabo para prevenir problemas durante el embarazo y [place]. EXAMENES PRENATALES  Durante los controles prenatales, le tomarn la presin arterial, verificarn su peso y [place] harn [place] de [place]. Todo esto se realiza para asegurar que usted progresa de [place] normal y Office manager.   Una mujer embarazada ganar de 25 a 35 libras (8 a 12 kilos) Academic librarian. Sin embargo, si tiene sobrepeso o bajo peso el mdico le aconsejar acerca de [place].   Entre los [employer] solicitarn anlisis de [place], cultivos de cuello de [place], [place] y [place]. Estas pruebas se realizan para controlar su salud y la del beb. Se recomienda realizar todos los [employer] y tambin pruebas para la deteccin del VIH, si usted lo autoriza. Este es el virus que causa el [place]. Estas pruebas se realizan porque existen medicamentos que podrn administrarle para prevenir que el beb nazca con esta infeccin, si usted estuviera infectada y no lo supiera. Tambin se solicitan anlisis de sangre para conocer su grupo sanguneo, infecciones previas y para Chief Operating Officer los glbulos rojos (hemoglobina).   Un bajo nivel de hemoglobina (anemia) es frecuente durante el embarazo. Para prevenirla, se administran hierro y vitaminas. En etapas posteriores del [employer] solicitarn anlisis de sangre para descartar diabetes y otros anlisis para Sales promotion account executive otros problemas. Los anlisis son necesarios para verificar que usted y el beb estn bien.   Necesitar otras pruebas que se realizan para asegurarse de que el beb est saludable.  CAMBIOS DURANTE EL PRIMER TRIMESTRE (LOS TRES PRIMEROS MESES DEL EMBARAZO). Su organismo atravesar numerosos cambios Museum/gallery curator. Estos pueden variar de Neomia Dear persona a otra. Converse con el profesional que la asiste acerca los cambios que usted note y que la preocupen.  El perodo menstrual se detiene.   El vulo y los espermatozoides llevan los genes que determinan cmo seremos. Sus genes y los de su pareja forman un beb. Los genes del varn determinan si ser un nio o una nia.   No tome ningn medicamento a menos que se lo haya indicado el profesional que la asiste.   Aumentar la circunferencia de su cuerpo y se sentir hinchada.   Podr sentir nuseas o vmitos. Si los vmitos son incontrolables, comunquese con el profesional que la asiste.   Sus mamas comenzarn a agrandarse y estarn ms sensibles.   Los pezones salen hacia afuera y se oscurecen.   Mayor necesidad de Geographical information systems officer. Si siente dolor al orinar podra tener una infeccin urinaria.   Cansarse fcilmente.   Prdida del apetito.   Retorcijones por ciertos tipos de alimentos.   Al principio, podr ganar o perder algo de peso.   Podr sentir Warden/ranger (alegra por Firefighter o preocupacin acerca de que haya algo mal en el embarazo o con el beb).   Podr tener sueos ms vvidos y fuertes.  INSTRUCCIONES PARA EL CUIDADO DOMICILIARIO  Es extremadamente importante que evite el cigarrillo, el alcohol y las drogas no prescriptas durante el Psychiatrist. Estas sustancias qumicas afectan la formacin y el desarrollo del beb. Evite estas sustancias qumicas durante todo el embarazo para asegurar el nacimiento de un beb sano.   Comience las consultas prenatales alrededor de la 12 semana de [place]. Generalmente se programan cada mes al principio y se hacen ms frecuentes en los 2 ltimos meses antes del parto. Cumpla con las citas tal como se le indic. Siga las indicaciones del profesional con respecto a los medicamentos que toma, los anlisis de laboratorio, la actividad fsica y Psychologist, forensic.   Durante el embarazo debe obtener  nutrientes para usted y para su beb. Consuma alimentos balanceados. Elija alimentos como carne, pescado, [place] y otros productos lcteos descremados, vegetales, frutas, panes integrales y cereales. El Equities trader cul es el aumento de peso ideal.   Las nuseas matinales pueden aliviarse si come algunas galletitas saladas en la cama. Coma dos galletitas antes de levantarse por la maana. Tambin puede comer galletitas sin sal.   Tome 4  5 comidas pequeas por Geophysical data processor de 3 comidas abundantes para evitar las nuseas y los vmitos.   Tambin puede prevenir las nuseas bebiendo lquidos entre comidas en lugar de [place] come.   Si no tiene problemas, puede continuar [employer] a lo largo de todo Firefighter. Algunos problemas pueden ser la prdida prematura de lquido amnitico de las [place], hemorragias vaginales o dolor en el abdomen.   Realice [employer], si no tiene restricciones. Consulte con el profesional que la asiste o con un fisioterapeuta si no sabe con certeza si determinados ejercicios son seguros. En los dos ltimos trimestres del embarazo puede ocurrir un aumento de [place]. La actividad fsica ayudar a:   Engineering geologist.   Mantenerse en forma.   Prepararse para el parto y [place] de [place].   Ayuda a perder el peso ganado en el embarazo luego del [place].   Use un buen sostn o como los que se usan para hacer deportes para Paramedic la sensibilidad de las [place]. Tambin puede serle til si lo [place] mientras duerme.   Consulte cuando tendr el curso preparatorio para Engineer, manufacturing systems. Comience las clases cuando se las ofrezcan.   No utilice la baera con agua caliente, baos turcos y saunas.   Colquese el cinturn de seguridad cuando conduzca. Este la proteger a usted y al beb en caso de accidente.   Evite comer carne cruda y el contacto con los utensilios y desperdicios de los gatos. Estos elementos  contienen grmenes que pueden causar defectos de nacimiento en el beb.   El primer trimestre es un buen momento para visitar a su dentista y Software engineer. Es importante mantener los dientes limpios. Use un cepillo de dientes suave y cepllese con ms suavidad durante el [employer].   Pida ayuda si tiene necesidades econmicas, de asesoramiento o nutricionales durante el [place]. El profesional podr ayudarla con respecto a estas necesidades, o derivarla a otros especialistas.   No tome ningn medicamento a menos que se lo haya indicado el profesional que la asiste.   Informe al profesional que lo asiste si es vctima de algn tipo de Advertising copywriter, ya sea mental o fsica.   Haga una lista de nmeros de telfono de Associate Professor de familiares, amigos, hospital, polica y bomberos.   Anote sus preguntas.  Llvelas con usted en su prxima visita de control.   No utilice duchas vaginales.   No cruce las piernas.   Si ha estado parada durante largos perodos de [place], mueva los pies o realice pequeos pasos en crculo.   Podr tener secreciones vaginales que pueden requerir una compresa o [place]. No utilice tampones ni compresas con perfume.  EL CONSUMO DE MEDICAMENTOS Y DROGAS DURANTE EL EMBARAZO  Tome las vitaminas apropiadas para esta etapa tal como se le indic. Las vitaminas deben contener 1 miligramo de cido flico. Guarde todas las vitaminas fuera del alcance de los nios. La ingestin de slo un par de vitaminas o tabletas que contengan hierro pueden ocasionar la [employer] en un beb o en un nio pequeo.   Evite el uso de medicamentos, inclusive hierbas, que no hayan sido prescriptos o indicados por el profesional que la asiste. Utilice los medicamentos de venta libre o de prescripcin para Chief Technology Officer, Environmental health practitioner o la [place], segn se lo indique el profesional que lo asiste. No utilice aspirina, ibuprofeno o naproxeno a menos que el profesional la autorice.   El consumo de  alcohol se relaciona con ciertos defectos de nacimiento. Entre ellos el sndrome de alcoholismo fetal. Debe evitar el consumo de alcohol en cualquiera de sus formas. El cigarrillo causa nacimientos prematuros y bebs de [place].   Las drogas ocasionan terribles daos al beb. Estn absolutamente prohibidas. Un beb que nace de [employer], ser adicto al nacer. Ese beb tendr los mismos sntomas de abstinencia que un adulto.   No consuma drogas. Pueden daar gravemente al beb.   Converse con el mdico acerca de cualquier medicamento que est tomando y la razn por la cual los toma.  EL ABORTO ESPONTNEO ES UNA SITUACIN FRECUENTE Esto no significa que ha hecho las cosas mal. No es un motivo para preocuparse en caso de un nuevo embarazo. El profesional que la asiste la ayudar si tiene preguntas para formular. Si ha sufrido un aborto espontneo, Pension scheme manager someterse a Futures trader de curetaje, SOLICITE ATENCIN MDICA SI: Tiene alguna preocupacin durante el embarazo. Es mejor que llame para formular las preguntas si no puede esperar hasta la prxima visita, que sentirse preocupada por ellas. SOLICITE ATENCIN MDICA DE INMEDIATO SI:  La temperatura oral se eleva sin motivo por encima de 102 F (38.9 C) o segn le indique el profesional que lo asiste.   Tiene una prdida de lquido por la vagina (canal de parto). Si sospecha una ruptura de las [place], tmese la temperatura y llame al profesional para informarlo sobre esto.   Observa unas pequeas manchas o una hemorragia vaginal. Notifique al profesional acerca de la cantidad y de cuntos apsitos est utilizando.   Presenta un olor desagradable en la secrecin vaginal y observa un cambio en el color.   Contina con las nuseas y no obtiene alivio de los remedios indicados. Vomita sangre o algo similar a la borra del caf.   Pierde ms de 1 Kg de peso en [employer].   Aumenta ms de 1 Kg en una semana y [address], las  manos, los pies o las piernas hinchados.   Aumenta ms de 2,5 Kg en una semana (aunque no tenga las manos, pies, piernas o el rostro hinchados).   Ha estado expuesta a la rubola y no ha sufrido la enfermedad. Ha estado expuesta a la quinta enfermedad o a la varicela.   Ha estado expuesta a la quinta enfermedad o a  la varicela.   Presenta dolor abdominal. Las molestias en el ligamento redondo son Neomia Dear causa benigna (no cancerosa) frecuente de dolor abdominal durante el embarazo. El profesional que la asiste deber evaluarla.   Presenta dolor de Renne Musca, diarrea, dolor al orinar o le falta la respiracin.   Sufre una cada, un accidente automovilstico o algn traumatismo.   Sufre violencia fsica o mental en su hogar.  Document Released: 04/29/2005 Document Revised: 07/09/2011 [facility] Patient Information 2012 [place], [place].

## 2012-03-23 NOTE — Progress Notes (Signed)
Pulse 77 Patient reports lower back pain. Also states she has "spasms/cramping in the stomach after everything I eat and my stomach gets really tight". Patient states that if she doesn't eat her "stomach feels as if I'm going to have diarrhea" Needs OB panel, HIV, Hbg electro. And 1 hr gtt @ 1154

## 2012-03-23 NOTE — Progress Notes (Signed)
Pt still having cramping in stomach, feels gassy and gurgly after eating. No diarrhea. Has been having some constipation. Suggested simethicone or colace. Pregnancy detected at family practice office. Was going to start OCPs for irregular menses but pregnancy test positive. 2 prior c-sections, then 2 late miscarriages. Per pt had one preterm delivery at 22 wks that survived in NICU 10 days then died of infection. Had 17P with last pregnancy and talked about cerclage but miscarried around 20 weeks. Pt very nervous about this pregnancy. Will schedule sono for dating, cervical length. Discuss options at next visit. Needs pap, wet prep, GC next visit.

## 2012-03-24 ENCOUNTER — Ambulatory Visit (HOSPITAL_COMMUNITY)
Admission: RE | Admit: 2012-03-24 | Discharge: 2012-03-24 | Disposition: A | Discharge status: Home / Self Care | Payer: Self-pay | Source: Ambulatory Visit | Attending: Family Medicine | Admitting: Family Medicine

## 2012-03-24 DIAGNOSIS — O3680X0 Pregnancy with inconclusive fetal viability, not applicable or unspecified: Secondary | ICD-10-CM

## 2012-03-24 LAB — OBSTETRIC PANEL
Basophils Relative: 0 % (ref 0–1)
Eosinophils Absolute: 0.1 10*3/uL (ref 0.0–0.7)
Hepatitis B Surface Ag: NEGATIVE
MCH: 30.1 pg (ref 26.0–34.0)
MCHC: 35.4 g/dL (ref 30.0–36.0)
Neutro Abs: 7.2 10*3/uL (ref 1.7–7.7)
Neutrophils Relative %: 76 % (ref 43–77)
Platelets: 317 10*3/uL (ref 150–400)
RBC: 4.45 MIL/uL (ref 3.87–5.11)

## 2012-03-25 LAB — HEMOGLOBINOPATHY EVALUATION
Hemoglobin Other: 0 %
Hgb A2 Quant: 2.7 % (ref 2.2–3.2)
Hgb A: 97.3 % (ref 96.8–97.8)
Hgb F Quant: 0 % (ref 0.0–2.0)
Hgb S Quant: 0 %

## 2012-03-28 ENCOUNTER — Inpatient Hospital Stay (HOSPITAL_COMMUNITY)
Admission: AD | Admit: 2012-03-28 | Discharge: 2012-03-28 | Disposition: A | Discharge status: Home / Self Care | Payer: Self-pay | Source: Ambulatory Visit | Attending: Obstetrics & Gynecology | Admitting: Obstetrics & Gynecology

## 2012-03-28 ENCOUNTER — Encounter (HOSPITAL_COMMUNITY): Payer: Self-pay | Admitting: *Deleted

## 2012-03-28 ENCOUNTER — Inpatient Hospital Stay (HOSPITAL_COMMUNITY): Payer: Self-pay

## 2012-03-28 DIAGNOSIS — O26859 Spotting complicating pregnancy, unspecified trimester: Secondary | ICD-10-CM | POA: Insufficient documentation

## 2012-03-28 DIAGNOSIS — N939 Abnormal uterine and vaginal bleeding, unspecified: Secondary | ICD-10-CM

## 2012-03-28 DIAGNOSIS — O34219 Maternal care for unspecified type scar from previous cesarean delivery: Secondary | ICD-10-CM

## 2012-03-28 DIAGNOSIS — K5900 Constipation, unspecified: Secondary | ICD-10-CM | POA: Insufficient documentation

## 2012-03-28 DIAGNOSIS — N898 Other specified noninflammatory disorders of vagina: Secondary | ICD-10-CM

## 2012-03-28 LAB — URINALYSIS, ROUTINE W REFLEX MICROSCOPIC
Bilirubin Urine: NEGATIVE
Ketones, ur: NEGATIVE mg/dL
Protein, ur: NEGATIVE mg/dL
Specific Gravity, Urine: 1.01 (ref 1.005–1.030)
Urobilinogen, UA: 1 mg/dL (ref 0.0–1.0)

## 2012-03-28 LAB — WET PREP, GENITAL
Clue Cells Wet Prep HPF POC: NONE SEEN
Trich, Wet Prep: NONE SEEN
Yeast Wet Prep HPF POC: NONE SEEN

## 2012-03-28 LAB — URINE MICROSCOPIC-ADD ON

## 2012-03-28 MED ORDER — DOCUSATE SODIUM 250 MG PO CAPS: 250.0000 mg | ORAL_CAPSULE | Freq: Every day | ORAL | Status: DC

## 2012-03-28 NOTE — MAU Note (Signed)
Having a lot of brown discharge, started yesterday. Vaginal burning- started yesterday. Burning on sides- points to flank area. (neg CVA tenderness). Nausea and constipation- last BM was yesterday- had to strain a lot.  Pain started before.

## 2012-03-28 NOTE — MAU Note (Signed)
Pt presents to MAU with c/o a brown discharge that started at 1200 today.  She also c/o vaginal itching/burning.

## 2012-03-28 NOTE — ED Provider Notes (Signed)
History     CSN: [id number]  Arrival date and time: 03/28/12 1309   None     Chief Complaint  Patient presents with  . Vaginal Discharge   HPI Comments:  [place] is a 31 y.o. [phone] female at [redacted]w[redacted]d by LMP who presents with  vaginal discharge that began on Friday.  Her first two children were born without complication at term via c-section. Her third pregnancy resulted in a preterm birth at around 22 weeks that died in the NICU. During her fourth pregnancy she noticed the amniotic sac protruding from the vagina around the 5 month mark.  An attempt was made to save the pregnancy but the ultimate result was demise of the fetus within 10 days. She has been having a "small amount" of yellowish discharge that has increased in intensity and turned a brownish color as of today.  No clots, odors, or frank red blood noted in discharge by patient. She endorses experiencing some dysuria and vaginal pruritis. She denies fever, chills, vomiting, dizzyness, lightheadedness, headache, abdominal pain, or contractions.   Report Given to Sharen Counter CNM      Past Medical History  Diagnosis Date  . Anxiety     Past Surgical History  Procedure Date  . Cesarean section     Family History  Problem Relation Age of Onset  . Other Neg Hx   . Hypertension Mother   . Hypertension Father     History  Substance Use Topics  . Smoking status: Never Smoker   . Smokeless tobacco: Never Used  . Alcohol Use: No    Allergies: No Known Allergies  Prescriptions prior to admission  Medication Sig Dispense Refill  . Prenatal Multivit-Min-Fe-FA (PRENATAL VITAMINS) 0.8 MG tablet Take 1 tablet by mouth daily.  30 tablet  9    Review of Systems  Gastrointestinal: Positive for nausea and constipation. Negative for vomiting and abdominal pain.  Genitourinary: Positive for vaginal discharge.  See HPI for others. Physical Exam   Blood pressure 115/71, pulse 86, temperature 98.2  F (36.8 C), temperature source Oral, resp. rate 20, height 4' 10.25" (1.48 m), weight 171 lb (77.565 kg), last menstrual period 12/24/2011, unknown if currently breastfeeding.  Physical Exam  Constitutional: She appears well-developed and well-nourished. No distress.  HENT:  Head: Normocephalic and atraumatic.  Right Ear: External ear normal.  Left Ear: External ear normal.  Cardiovascular: Normal rate, regular rhythm, S1 normal, S2 normal, normal heart sounds and intact distal pulses.  Exam reveals no gallop and no friction rub.   No murmur heard. Respiratory: Effort normal and breath sounds normal. No respiratory distress. She has no wheezes. She has no rales.  GI: Soft. Bowel sounds are normal. She exhibits no shifting dullness, no pulsatile liver, no abdominal bruit, no ascites, no pulsatile midline mass and no mass. There is no hepatosplenomegaly. There is no tenderness. There is no rebound and no guarding.  Genitourinary: Vagina normal. Cervix exhibits discharge.       Noticed small amount thin brownish blood around the os along with thick greenish-white mucus. No odors.  Open ~1 cm on the outside, closed inside.  Musculoskeletal:       No pedal edema  Skin: She is not diaphoretic.   Blood type B positive  Results for orders placed during the hospital encounter of 03/28/12 (from the past 24 hour(s))  URINALYSIS, ROUTINE W REFLEX MICROSCOPIC     Status: Abnormal   Collection Time   03/28/12  2:00 PM  Component Value Range   Color, Urine YELLOW  YELLOW   APPearance CLEAR  CLEAR   Specific Gravity, Urine 1.010  1.005 - 1.030   pH 6.0  5.0 - 8.0   Glucose, UA NEGATIVE  NEGATIVE mg/dL   Hgb urine dipstick SMALL (*) NEGATIVE   Bilirubin Urine NEGATIVE  NEGATIVE   Ketones, ur NEGATIVE  NEGATIVE mg/dL   Protein, ur NEGATIVE  NEGATIVE mg/dL   Urobilinogen, UA 1.0  0.0 - 1.0 mg/dL   Nitrite NEGATIVE  NEGATIVE   Leukocytes, UA NEGATIVE  NEGATIVE  URINE MICROSCOPIC-ADD ON      Status: Abnormal   Collection Time   03/28/12  2:00 PM      Component Value Range   Squamous Epithelial / LPF FEW (*) RARE   RBC / HPF 0-2  <3 RBC/hpf  WET PREP, GENITAL     Status: Abnormal   Collection Time   03/28/12  4:25 PM      Component Value Range   Yeast Wet Prep HPF POC NONE SEEN  NONE SEEN   Trich, Wet Prep NONE SEEN  NONE SEEN   Clue Cells Wet Prep HPF POC NONE SEEN  NONE SEEN   WBC, Wet Prep HPF POC FEW (*) NONE SEEN  US Ob Limited  03/28/2012  **ADDENDUM** CREATED: 03/28/2012 19:14:53  No subchorionic, retroplacental or pre placental hemorrhage seen.  **END ADDENDUM** SIGNED BY: Londell Moh. Azucena Kuba, M.D.   03/28/2012  *RADIOLOGY REPORT*  Clinical Data: Manson Passey vaginal discharge.  13 weeks and 4 days pregnant by previous ultrasound.  Clinical concern for determining cervical length.  LIMITED OBSTETRIC ULTRASOUND  Comparison:  03/18/2012.  Number of Fetuses: 1 Heart Rate: 156 bpm Presentation: Cephalic Placental Location: Posterior Previa: No Amniotic Fluid (Subjective): Normal  Vertical Pocket 3.1 cm  BPD:  2.66 cm     14 w  5 d  MATERNAL FINDINGS: Cervix: 3.8 cm in length.  Closed. Uterus/Adnexae:  2.9 cm simple right ovarian cyst.  Poorly visualized, grossly normal left ovary.  No free peritoneal fluid.  IMPRESSION: Single live intrauterine gestation in a cephalic presentation with no complicating features seen.  The cervix is closed, measuring 3.8 cm in length.  Recommend followup with non-emergent complete OB 14+ wk US examination for fetal biometric evaluation and anatomic survey if not already performed.  Original Report Authenticated By: Darrol Angel, M.D.    MAU Course  Procedures Transabdominal ultrasound showed fetal heart tones and movement.  MDM GC/Clamyidia, Trich Wet Prep CBC Cervical length Ultrasound  Assessment and Plan  Vaginal spotting Constipation  Reviewed pt history, assessment, and findings with Dr Marice Potter D/C home Pelvic rest, reduced activities this  week F/U as scheduled at [facility] Wednesday May take Colace PRN Return to MAU as needed  Doran Heater 03/28/2012, 5:13 PM   I have seen this patient and agree with the above student's note.  LEFTWICH-KIRBY, LISA Certified Nurse-Midwife

## 2012-03-30 ENCOUNTER — Ambulatory Visit (INDEPENDENT_AMBULATORY_CARE_PROVIDER_SITE_OTHER): Payer: Self-pay | Admitting: Family Medicine

## 2012-03-30 ENCOUNTER — Encounter (HOSPITAL_COMMUNITY): Payer: Self-pay | Admitting: Pharmacist

## 2012-03-30 VITALS — BP 107/74 | Temp 98.4°F | Wt 169.5 lb

## 2012-03-30 DIAGNOSIS — O34219 Maternal care for unspecified type scar from previous cesarean delivery: Secondary | ICD-10-CM

## 2012-03-30 DIAGNOSIS — Z98891 History of uterine scar from previous surgery: Secondary | ICD-10-CM

## 2012-03-30 DIAGNOSIS — O09299 Supervision of pregnancy with other poor reproductive or obstetric history, unspecified trimester: Secondary | ICD-10-CM

## 2012-03-30 DIAGNOSIS — R109 Unspecified abdominal pain: Secondary | ICD-10-CM

## 2012-03-30 DIAGNOSIS — O09219 Supervision of pregnancy with history of pre-term labor, unspecified trimester: Secondary | ICD-10-CM

## 2012-03-30 DIAGNOSIS — O0992 Supervision of high risk pregnancy, unspecified, second trimester: Secondary | ICD-10-CM

## 2012-03-30 DIAGNOSIS — O09899 Supervision of other high risk pregnancies, unspecified trimester: Secondary | ICD-10-CM

## 2012-03-30 LAB — POCT URINALYSIS DIP (DEVICE)
Glucose, UA: NEGATIVE mg/dL
Leukocytes, UA: NEGATIVE
Nitrite: NEGATIVE
Urobilinogen, UA: 1 mg/dL (ref 0.0–1.0)

## 2012-03-30 NOTE — Progress Notes (Signed)
Pt has hx incompetent cervix in 2011 with delivery at 20 wks. Previous preterm at 23 wks, unknown cervical length.  Recent Sono shows cervix currently 3.8 cm. Pt states her husband not sure about cerclage. Will bring in tomorrow for consult with husband. Will try to schedule cerclage for Friday. Also plan 17P at 16 weeks.

## 2012-03-30 NOTE — Patient Instructions (Signed)
Embarazo - Segundo trimestre (Pregnancy - Second Trimester) El segundo trimestre del embarazo (del 3 al 6mes) es un perodo de evolucin rpida para usted y el beb. Hacia el final del sexto mes, el beb mide aproximadamente 23 cm y pesa 680 g. Comenzar a sentir los movimientos del beb entre las 18 y las 20 semanas de embarazo. Podr sentir las pataditas ("quickening en ingls"). Hay un rpido aumento de peso. Puede segregar un lquido claro (calostro) de las mamas. Quizs sienta pequeas contracciones en el vientre (tero) Esto se conoce como falso trabajo de parto o contracciones de [place]. Es como una prctica del trabajo de parto que se produce cuando el beb est listo para salir. Generalmente los problemas de vmitos matinales ya se han superado hacia el final del primer trimestre. Algunas mujeres desarrollan pequeas manchas oscuras (que se denominan cloasma, mscara del embarazo) en la cara que normalmente se van luego del nacimiento del beb. La exposicin al sol empeora las manchas. Puede desarrollarse acn en algunas mujeres embarazadas, y puede desaparecer en aquellas que ya tienen acn. EXAMENES PRENATALES  Durante los exmenes prenatales, deber seguir realizando pruebas de sangre, segn avance el embarazo. Estas pruebas se realizan para controlar su salud y la del beb. Tambin se realizan anlisis de sangre para conocer los niveles de hemoglobina. La anemia (bajo nivel de hemoglobina) es frecuente durante el embarazo. Para prevenirla, se administran hierro y vitaminas. Tambin se le realizarn exmenes para saber si tiene diabetes entre las 24 y las 28 semanas del embarazo. Podrn repetirle algunas de las pruebas que le hicieron previamente.  En cada visita le medirn el tamao del tero. Esto se realiza para asegurarse de que el beb est creciendo correctamente de acuerdo al estado del embarazo.  Tambin en cada visita prenatal controlarn su presin arterial. Esto se realiza  para asegurarse de que no tenga toxemia.  Se controlar su orina para asegurarse de que no tenga infecciones, diabetes o protena en la orina.  Se controlar su peso regularmente para asegurarse que el aumento ocurre al ritmo indicado. Esto se hace para asegurarse que usted y el beb tienen una evolucin normal.  En algunas ocasiones se realiza una prueba de ultrasonido para confirmar el correcto desarrollo y evolucin del beb. Esta prueba se realiza con ondas sonoras inofensivas para el beb, de modo que el profesional pueda calcular ms precisamente la fecha del parto. Algunas veces se realizan pruebas especializadas del lquido amnitico que rodea al beb. Esta prueba se denomina amniocentesis. El lquido amnitico se obtiene introduciendo una aguja en el vientre (abdomen). Se realiza para controlar los cromosomas en aquellos casos en los que existe alguna preocupacin acerca de algn problema gentico que pueda sufrir el beb. En ocasiones se lleva a cabo cerca del final del embarazo, si es necesario inducir al parto. En este caso se realiza para asegurarse que los pulmones del beb estn lo suficientemente maduros como para que pueda vivir fuera del tero. CAMBIOS QUE OCURREN EN EL SEGUNDO TRIMESTRE DEL EMBARAZO Su organismo atravesar numerosos cambios durante el embarazo. Estos pueden variar de una persona a otra. Converse con el profesional que la asiste acerca los cambios que usted note y que la preocupen.  Durante el segundo trimestre probablemente sienta un aumento del apetito. Es normal tener "antojos" de ciertas comidas. Esto vara de una persona a otra y de un embarazo a otro.  El abdomen inferior comenzar a abultarse.  Podr tener la necesidad de orinar con ms frecuencia debido a que   el tero y el beb presionan sobre la vejiga. Tambin es frecuente contraer ms infecciones urinarias durante el embarazo (dolor al orinar). Puede evitarlas bebiendo gran cantidad de lquidos y vaciando  la vejiga antes y despus de mantener relaciones sexuales.  Podrn aparecer las primeras estras en las caderas, abdomen y mamas. Estos son cambios normales del cuerpo durante el embarazo. No existen medicamentos ni ejercicios que puedan prevenir estos cambios.  Es posible que comience a desarrollar venas inflamadas y abultadas (varices) en las piernas. El uso de medias de descanso, elevar sus pies durante 15 minutos, 3 a 4 veces al da y limitar la sal en su dieta ayuda a aliviar el problema.  Podr sentir acidez gstrica a medida que el tero crece y presiona contra el estmago. Puede tomar anticidos, con la autorizacin de su mdico, para aliviar este problema. Tambin es til ingerir pequeas comidas 4 a 5 veces al da.  La constipacin puede tratarse con un laxante o agregando fibra a su dieta. Beber grandes cantidades de lquidos, comer vegetales, frutas y granos integrales es de gran ayuda.  Tambin es beneficioso practicar actividad fsica. Si ha sido una persona activa hasta el embarazo, podr continuar con la mayora de las actividades durante el mismo. Si ha sido menos activa, puede ser beneficioso que comience con un programa de ejercicios, como realizar caminatas.  Puede desarrollar hemorroides (vrices en el recto) hacia el final del segundo trimestre. Tomar baos de asiento tibios y utilizar cremas recomendadas por el profesional que lo asiste sern de ayuda para los problemas de hemorroides.  Tambin podr sentir dolor de espalda durante este momento de su embarazo. Evite levantar objetos pesados, utilice zapatos de taco bajo y mantenga una buena postura para ayudar a reducir los problemas de espalda.  Algunas mujeres embarazadas desarrollan hormigueo y adormecimiento de la mano y los dedos debido a la hinchazn y compresin de los ligamentos de la mueca (sndrome del tnel carpiano). Esto desaparece una vez que el beb nace.  Como sus pechos se agrandan, necesitar un sujetador  ms grande. Use un sostn de soporte, cmodo y de algodn. No utilice un sostn para amamantar hasta el ltimo mes de embarazo si va a amamantar al beb.  Podr observar una lnea oscura desde el ombligo hacia la zona pbica denominada linea nigra.  Podr observar que sus mejillas se ponen coloradas debido al aumento de flujo sanguneo en la cara.  Podr desarrollar "araitas" en la cara, cuello y pecho. Esto desaparece una vez que el beb nace. INSTRUCCIONES PARA EL CUIDADO DOMICILIARIO  Es extremadamente importante que evite el cigarrillo, hierbas medicinales, alcohol y las drogas no prescriptas durante el embarazo. Estas sustancias qumicas afectan la formacin y el desarrollo del beb. Evite estas sustancias durante todo el embarazo para asegurar el nacimiento de un beb sano.  La mayor parte de los cuidados que se aconsejan son los mismos que los indicados para el primer trimestre del embarazo. Cumpla con las citas tal como se le indic. Siga las instrucciones del profesional que lo asiste con respecto al uso de los medicamentos, el ejercicio y la dieta.  Durante el embarazo debe obtener nutrientes para usted y para su beb. Consuma alimentos balanceados a intervalos regulares. Elija alimentos como carne, pescado, leche y otros productos lcteos descremados, vegetales, frutas, panes integrales y cereales. El profesional le informar cul es el aumento de peso ideal.  Las relaciones sexuales fsicas pueden continuarse hasta cerca del fin del embarazo si no existen otros problemas. Estos   problemas pueden ser la prdida temprana (prematura) de lquido amnitico de las membranas, sangrado vaginal, dolor abdominal u otros problemas mdicos o del embarazo.  Realice actividad fsica todos los das, si no tiene restricciones. Consulte con el profesional que la asiste si no sabe con certeza si determinados ejercicios son seguros. El mayor aumento de peso tiene lugar durante los ltimos 2 trimestres del  embarazo. El ejercicio la ayudar a:  Controlar su peso.  Ponerla en forma para el parto.  Ayudarla a perder peso luego de haber dado a luz.  Use un buen sostn o como los que se usan para hacer deportes para aliviar la sensibilidad de las mamas. Tambin puede serle til si lo usa mientras duerme. Si pierde calostro, podr utilizar apsitos en el sostn.  No utilice la baera con agua caliente, baos turcos y saunas durante el embarazo.  Utilice el cinturn de seguridad sin excepcin cuando conduzca. Este la proteger a usted y al beb en caso de accidente.  Evite comer carne cruda, queso crudo, y el contacto con los utensilios y desperdicios de los gatos. Estos elementos contienen grmenes que pueden causar defectos de nacimiento en el beb.  El segundo trimestre es un buen momento para visitar a su dentista y evaluar su salud dental si an no lo ha hecho. Es importante mantener los dientes limpios. Utilice un cepillo de dientes blando. Cepllese ms suavemente durante el embarazo.  Es ms fcil perder algo de orina durante el embarazo. Apretar y fortalecer los msculos de la pelvis la ayudar con este problema. Practique detener la miccin cuando est en el bao. Estos son los mismos msculos que necesita fortalecer. Son tambin los mismos msculos que utiliza cuando trata de evitar los gases. Puede practicar apretando estos msculos 10 veces, y repetir esto 3 veces por da aproximadamente. Una vez que conozca qu msculos debe apretar, no realice estos ejercicios durante la miccin. Puede favorecerle una infeccin si la orina vuelve hacia atrs.  Pida ayuda si tiene necesidades econmicas, de asesoramiento o nutricionales durante el embarazo. El profesional podr ayudarla con respecto a estas necesidades, o derivarla a otros especialistas.  La piel puede ponerse grasa. Si esto sucede, lvese la cara con un jabn suave, utilice un humectante no graso y maquillaje con base de aceite o  crema. CONSUMO DE MEDICAMENTOS Y DROGAS DURANTE EL EMBARAZO  Contine tomando las vitaminas apropiadas para esta etapa tal como se le indic. Las vitaminas deben contener un miligramo de cido flico y deben suplementarse con hierro. Guarde todas las vitaminas fuera del alcance de los nios. La ingestin de slo un par de vitaminas o tabletas que contengan hierro puede ocasionar la muerte en un beb o en un nio pequeo.  Evite el uso de medicamentos, inclusive los de venta libre y hierbas que no hayan sido prescriptos o indicados por el profesional que la asiste. Algunos medicamentos pueden causar problemas fsicos al beb. Utilice los medicamentos de venta libre o de prescripcin para el dolor, el malestar o la fiebre, segn se lo indique el profesional que lo asiste. No utilice aspirina.  El consumo de alcohol est relacionado con ciertos defectos de nacimiento. Esto incluye el sndrome de alcoholismo fetal. Debe evitar el consumo de alcohol en cualquiera de sus formas. El cigarrillo causa nacimientos prematuros y bebs de bajo peso. El uso de drogas recreativas est absolutamente prohibido. Son muy nocivas para el beb. Un beb que nace de una madre adicta, ser adicto al nacer. Ese beb tendr los mismos   causa nacimientos prematuros y bebs de [place]. El uso de drogas recreativas est absolutamente prohibido. Son muy nocivas para el beb. Un beb que nace de [employer], ser adicto al nacer. Ese beb tendr los mismos sntomas de abstinencia que un adulto.   Infrmele al profesional si consume alguna droga.   No consuma drogas ilegales. Pueden causarle mucho dao al beb.  SOLICITE ATENCIN MDICA SI: Tiene preguntas o preocupaciones durante su embarazo. Es mejor que llame para Science writer las dudas que esperar hasta su prxima visita prenatal. Thressa Sheller forma se sentir ms tranquila.  SOLICITE ATENCIN MDICA DE INMEDIATO SI:  La temperatura oral se eleva sin motivo por encima de 102 F (38.9 C) o segn le indique el profesional que lo asiste.   Tiene una prdida de lquido por la vagina (canal de parto). Si sospecha una ruptura de las [place], tmese la  temperatura y llame al profesional para informarlo sobre esto.   Observa unas pequeas manchas, una hemorragia vaginal o elimina cogulos. Notifique al profesional acerca de la cantidad y de cuntos apsitos est utilizando. Unas pequeas manchas de sangre son algo comn durante el Psychiatrist, especialmente despus de Sales promotion account executive.   Presenta un olor desagradable en la secrecin vaginal y observa un cambio en el color, de transparente a blanco.   Contina con las nuseas y no obtiene alivio de los remedios indicados. Vomita sangre o algo similar a la borra del caf.   [place] o sube ms de 900 g. en una semana, o segn lo indicado por el profesional que la asiste.   Observa que se le [employer], las manos, los pies o las piernas.   Ha estado expuesta a la rubola y no ha sufrido la enfermedad.   Ha estado expuesta a la quinta enfermedad o a la varicela.   Presenta dolor abdominal. Las molestias en el ligamento redondo son Neomia Dear causa no cancerosa (benigna) frecuente de dolor abdominal durante el embarazo. El profesional que la asiste deber evaluarla.   Presenta dolor de cabeza intenso que no se [place].   Presenta fiebre, diarrea, dolor al orinar o le falta la respiracin.   Presenta dificultad para ver, visin borrosa, o visin doble.   Sufre una cada, un accidente de trnsito o cualquier tipo de trauma.   Vive en un hogar en el que existe violencia fsica o mental.  Document Released: 04/29/2005 Document Revised: 07/09/2011 [facility] Patient Information 2012 [place], [place].  Cerclaje cervical (Cerclage of the Cervix) El cerclaje cervical es un procedimiento quirrgico en caso de un cuello incompetente. Un cuello incompetente es aquel que no puede mantener un embarazo dentro del tero y se abre antes del comienzo del [place]. En el cerclaje se sutura el cuello cerrado Academic librarian. COMENTE CON SU MDICO:  Si sufre alergias a medicamentos o  alimentos.   Todos los medicamentos de [address], prescriptos, hierbas, gotas oculares o cremas que est [place].   Si toma drogas ilegales o consume alcohol en exceso.   Resfrios o una infeccines.   Problemas anteriores debido a anestsicos o a Careers adviser.   Antecedentes de cogulos sanguneos o hemorragias anormales.   Cualquier problema mdico o de salud.  RIESGOS Y COMPLICACIONES  Infecciones   Hemorragias.   Ruptura de [place].   Trabajo de parto o parto prematuros   Problemas con la anestesia.   Infecciones en el saco amnitico (membranas).  ANTES DEL PROCEDIMIENTO  No tome aspirina  No debe comer ni beber nada durante las 8 horas previas al examen.   No fume.   Si va a ser [employer] mismo da del procedimiento, concurra al menos 60 minutos antes de la ciruga para Oceanographer los formularios necesarios y prepararse.   Habr una zona de espera disponible para sus familiares y amigos.  PROCEDIMIENTO  Le aplicarn una va intravenosa y medicamentos para que se relaje.   Luego la harn dormir aplicndole un anestsico general.   Le darn puntos dentro y alrededor del cuello para ajustarlo y [place].  DESPUES DEL PROCEDIMIENTO  La llevarn a una sala de recuperacin en la que usted y el beb sern monitoreados y luego.   Cuando puedan moverla con seguridad para usted y el beb, la llevarn a una habitacin en el hospital.   En general se indica una inyeccin de progesterona para evitar las contracciones uterinas.   Posiblemente deba pasar la noche en el hospital.   Es una buena idea pedirle a alguien que conduzca hasta su casa y Surveyor, mining con usted durante uno o [address].   Es posible que le indiquen medicamentos para cuando regrese a Pensions consultant.  INSTRUCCIONES PARA EL CUIDADO DOMICILIARIO  Tome slo medicamentos de venta libre o prescriptos, para Engineer, materials, las molestias o para Personal assistant fiebre,  segn las indicaciones del mdico.   Evite la actividad fsica y los ejercicios hasta que su mdico la autorice.   Siga con su dieta habitual.   No tome duchas vaginales.   No tenga relaciones sexuales hasta que el mdico la autorice.   Cumpla con los controles quirrgicos y prenatales con su mdico.  SOLICITE ANTENCIN MDICA SI:  Brett Fairy hemorragia vaginal anormal.   Aparece una erupcin cutnea.   Tiene problemas con los medicamentos.   Si se siente confundida o desfalleciente.  SOLICITE ATENCIN MDICA DE INMEDIATO SI:  Presenta una hemorragia vaginal abundante.   Tiene una prdida importante de lquido de la vagina.   Su temperatura se eleva por encima de 102 F (38.9 C).   Se desmaya.   Tiene contracciones uterinas.   Siente que el beb no se mueve como lo hace habitualmente o usted no siente sus movimientos.  Document Released: 10/16/2008 Document Revised: 07/09/2011 [facility] Patient Information 2012 [place], [place].

## 2012-03-30 NOTE — Progress Notes (Signed)
P-90 

## 2012-03-31 ENCOUNTER — Encounter (HOSPITAL_COMMUNITY)
Admission: RE | Admit: 2012-03-31 | Discharge: 2012-03-31 | Disposition: A | Discharge status: Home / Self Care | Payer: Self-pay | Source: Ambulatory Visit | Attending: Family Medicine | Admitting: Family Medicine

## 2012-03-31 ENCOUNTER — Encounter (HOSPITAL_COMMUNITY): Payer: Self-pay

## 2012-03-31 ENCOUNTER — Ambulatory Visit (INDEPENDENT_AMBULATORY_CARE_PROVIDER_SITE_OTHER): Payer: Self-pay | Admitting: Physician Assistant

## 2012-03-31 VITALS — BP 106/81 | Temp 97.8°F | Wt 168.3 lb

## 2012-03-31 DIAGNOSIS — O09219 Supervision of pregnancy with history of pre-term labor, unspecified trimester: Secondary | ICD-10-CM

## 2012-03-31 DIAGNOSIS — N898 Other specified noninflammatory disorders of vagina: Secondary | ICD-10-CM

## 2012-03-31 LAB — CBC
Hemoglobin: 12.4 g/dL (ref 12.0–15.0)
MCHC: 35.5 g/dL (ref 30.0–36.0)
RDW: 11.9 % (ref 11.5–15.5)

## 2012-03-31 LAB — POCT URINALYSIS DIP (DEVICE)
Bilirubin Urine: NEGATIVE
Leukocytes, UA: NEGATIVE
Nitrite: NEGATIVE
Protein, ur: NEGATIVE mg/dL
pH: 6.5 (ref 5.0–8.0)

## 2012-03-31 NOTE — Patient Instructions (Signed)
Cerclaje cervical (Cerclage of the Cervix) El cerclaje cervical es un procedimiento quirrgico en caso de un cuello incompetente. Un cuello incompetente es aquel que no puede mantener un embarazo dentro del tero y se abre antes del comienzo del [place]. En el cerclaje se sutura el cuello cerrado Academic librarian. COMENTE CON SU MDICO:  Si sufre alergias a medicamentos o alimentos.   Todos los medicamentos de [address], prescriptos, hierbas, gotas oculares o cremas que est [place].   Si toma drogas ilegales o consume alcohol en exceso.   Resfrios o una infeccines.   Problemas anteriores debido a anestsicos o a Careers adviser.   Antecedentes de cogulos sanguneos o hemorragias anormales.   Cualquier problema mdico o de salud.  RIESGOS Y COMPLICACIONES  Infecciones   Hemorragias.   Ruptura de [place].   Trabajo de parto o parto prematuros   Problemas con la anestesia.   Infecciones en el saco amnitico (membranas).  ANTES DEL PROCEDIMIENTO  No tome aspirina   No debe comer ni beber nada durante las 8 horas previas al examen.   No fume.   Si va a ser [employer] mismo da del procedimiento, concurra al menos 60 minutos antes de la ciruga para Oceanographer los formularios necesarios y prepararse.   Habr una zona de espera disponible para sus familiares y amigos.  PROCEDIMIENTO  Le aplicarn una va intravenosa y medicamentos para que se relaje.   Luego la harn dormir aplicndole un anestsico general.   Le darn puntos dentro y alrededor del cuello para ajustarlo y [place].  DESPUES DEL PROCEDIMIENTO  La llevarn a una sala de recuperacin en la que usted y el beb sern monitoreados y luego.   Cuando puedan moverla con seguridad para usted y el beb, la llevarn a una habitacin en el hospital.   En general se indica una inyeccin de progesterona para evitar las contracciones uterinas.    Posiblemente deba pasar la noche en el hospital.   Es una buena idea pedirle a alguien que conduzca hasta su casa y Surveyor, mining con usted durante uno o [address].   Es posible que le indiquen medicamentos para cuando regrese a Pensions consultant.  INSTRUCCIONES PARA EL CUIDADO DOMICILIARIO  Tome slo medicamentos de venta libre o prescriptos, para Engineer, materials, las molestias o para Personal assistant fiebre, segn las indicaciones del mdico.   Evite la actividad fsica y los ejercicios hasta que su mdico la autorice.   Siga con su dieta habitual.   No tome duchas vaginales.   No tenga relaciones sexuales hasta que el mdico la autorice.   Cumpla con los controles quirrgicos y prenatales con su mdico.  SOLICITE ANTENCIN MDICA SI:  Brett Fairy hemorragia vaginal anormal.   Aparece una erupcin cutnea.   Tiene problemas con los medicamentos.   Si se siente confundida o desfalleciente.  SOLICITE ATENCIN MDICA DE INMEDIATO SI:  Presenta una hemorragia vaginal abundante.   Tiene una prdida importante de lquido de la vagina.   Su temperatura se eleva por encima de 102 F (38.9 C).   Se desmaya.   Tiene contracciones uterinas.   Siente que el beb no se mueve como lo hace habitualmente o usted no siente sus movimientos.  Document Released: 10/16/2008 Document Revised: 07/09/2011 [facility] Patient Information 2012 [place], [place].

## 2012-03-31 NOTE — Patient Instructions (Addendum)
Your procedure is scheduled on:04/01/12  Enter through the Main Entrance at :0915 Pick up desk phone and dial [phone] and inform us of your arrival.  Please call [phone] if you have any problems the morning of surgery.  Remember: Do not eat after midnight:tonight Do not drink after:midnight tonight  Take these meds the morning of surgery with a sip of water:none  DO NOT wear jewelry, eye make-up, lipstick,body lotion, or dark fingernail polish. Do not shave for 48 hours prior to surgery. Patients discharged on the day of surgery will not be allowed to drive home.   Remember to use your Hibiclens as instructed.

## 2012-03-31 NOTE — Progress Notes (Signed)
Pulse: 83

## 2012-03-31 NOTE — Progress Notes (Signed)
Pt presents for consult for cerclage. Husband present in today's visit. History reviewed. Strongly recommend prophylactic cerclage secondary to cervical incompetence. Questions answered. Pt and husband agree to cerclage placement tomorrow. Risk/benefits reviewed at length. Verbal consent received. Will notify pre-op pt here today to facilitate pre-op appt. Dr. Shawnie Pons to meet pt today in clinic for formal consent.

## 2012-04-01 ENCOUNTER — Ambulatory Visit (HOSPITAL_COMMUNITY): Payer: Self-pay | Admitting: Anesthesiology

## 2012-04-01 ENCOUNTER — Encounter (HOSPITAL_COMMUNITY): Payer: Self-pay | Admitting: Anesthesiology

## 2012-04-01 ENCOUNTER — Encounter (HOSPITAL_COMMUNITY)
Admission: RE | Disposition: A | Discharge status: Home / Self Care | Payer: Self-pay | Source: Ambulatory Visit | Attending: Family Medicine

## 2012-04-01 ENCOUNTER — Ambulatory Visit (HOSPITAL_COMMUNITY)
Admission: RE | Admit: 2012-04-01 | Discharge: 2012-04-01 | Disposition: A | Discharge status: Home / Self Care | Payer: Self-pay | Source: Ambulatory Visit | Attending: Family Medicine | Admitting: Family Medicine

## 2012-04-01 DIAGNOSIS — Z01818 Encounter for other preprocedural examination: Secondary | ICD-10-CM | POA: Insufficient documentation

## 2012-04-01 DIAGNOSIS — O3430 Maternal care for cervical incompetence, unspecified trimester: Secondary | ICD-10-CM | POA: Diagnosis present

## 2012-04-01 DIAGNOSIS — Z01812 Encounter for preprocedural laboratory examination: Secondary | ICD-10-CM | POA: Insufficient documentation

## 2012-04-01 HISTORY — PX: CERVICAL CERCLAGE: SHX1329

## 2012-04-01 SURGERY — CERCLAGE, CERVIX, VAGINAL APPROACH
Anesthesia: Spinal | Site: Vagina | Wound class: Clean Contaminated

## 2012-04-01 MED ORDER — KETOROLAC TROMETHAMINE 30 MG/ML IJ SOLN: 15.0000 mg | Freq: Once | INTRAMUSCULAR | Status: DC | PRN

## 2012-04-01 MED ORDER — BUPIVACAINE HCL (PF) 0.25 % IJ SOLN
INTRAMUSCULAR | Status: AC
  Filled 2012-04-01: qty 30

## 2012-04-01 MED ORDER — ONDANSETRON HCL 4 MG/2ML IJ SOLN
INTRAMUSCULAR | Status: AC
  Filled 2012-04-01: qty 2

## 2012-04-01 MED ORDER — CEFAZOLIN SODIUM-DEXTROSE 2-3 GM-% IV SOLR
2.0000 g | INTRAVENOUS | Status: AC
  Administered 2012-04-01: 2 g via INTRAVENOUS

## 2012-04-01 MED ORDER — FENTANYL CITRATE 0.05 MG/ML IJ SOLN: 25.0000 ug | INTRAMUSCULAR | Status: DC | PRN

## 2012-04-01 MED ORDER — INDOMETHACIN 50 MG PO CAPS: 50.0000 mg | ORAL_CAPSULE | Freq: Four times a day (QID) | ORAL | Status: DC

## 2012-04-01 MED ORDER — LACTATED RINGERS IV SOLN
INTRAVENOUS | Status: DC
  Administered 2012-04-01 (×2): via INTRAVENOUS

## 2012-04-01 MED ORDER — FENTANYL CITRATE 0.05 MG/ML IJ SOLN
INTRAMUSCULAR | Status: AC
  Filled 2012-04-01: qty 2

## 2012-04-01 MED ORDER — LIDOCAINE IN DEXTROSE 5-7.5 % IV SOLN
INTRAVENOUS | Status: DC | PRN
  Administered 2012-04-01: 45 mg via INTRATHECAL

## 2012-04-01 MED ORDER — ONDANSETRON HCL 4 MG/2ML IJ SOLN
INTRAMUSCULAR | Status: DC | PRN
  Administered 2012-04-01: 4 mg via INTRAVENOUS

## 2012-04-01 MED ORDER — CEFAZOLIN SODIUM-DEXTROSE 2-3 GM-% IV SOLR
INTRAVENOUS | Status: AC
  Filled 2012-04-01: qty 50

## 2012-04-01 SURGICAL SUPPLY — 20 items
CATH ROBINSON RED A/P 16FR (CATHETERS) ×1 IMPLANT
CLOTH BEACON ORANGE TIMEOUT ST (SAFETY) ×2 IMPLANT
COUNTER NEEDLE 1200 MAGNETIC (NEEDLE) ×1 IMPLANT
GLOVE BIOGEL PI IND STRL 7.0 (GLOVE) ×1 IMPLANT
GLOVE BIOGEL PI INDICATOR 7.0 (GLOVE) ×1
GLOVE ECLIPSE 7.0 STRL STRAW (GLOVE) ×4 IMPLANT
GOWN PREVENTION PLUS LG XLONG (DISPOSABLE) ×2 IMPLANT
GOWN PREVENTION PLUS XLARGE (GOWN DISPOSABLE) ×2 IMPLANT
NDL SPNL 22GX3.5 QUINCKE BK (NEEDLE) IMPLANT
NEEDLE MAYO .5 CIRCLE (NEEDLE) ×2 IMPLANT
NEEDLE SPNL 22GX3.5 QUINCKE BK (NEEDLE) ×2 IMPLANT
PACK VAGINAL MINOR WOMEN LF (CUSTOM PROCEDURE TRAY) ×2 IMPLANT
PAD PREP 24X48 CUFFED NSTRL (MISCELLANEOUS) ×2 IMPLANT
SUT MERSILENE 5MM BP 1 12 (SUTURE) ×2 IMPLANT
SYR BULB IRRIGATION 50ML (SYRINGE) ×2 IMPLANT
SYR CONTROL 10ML LL (SYRINGE) ×1 IMPLANT
TOWEL OR 17X24 6PK STRL BLUE (TOWEL DISPOSABLE) ×4 IMPLANT
TUBING NON-CON 1/4 X 20 CONN (TUBING) ×1 IMPLANT
WATER STERILE IRR 1000ML POUR (IV SOLUTION) ×1 IMPLANT
YANKAUER SUCT BULB TIP NO VENT (SUCTIONS) ×1 IMPLANT

## 2012-04-01 NOTE — H&P (Signed)
Teresa Day is a 31 y.o. female presenting for cerclage. Maternal Medical History:  Reason for admission: Reason for Admission:   nauseaPt is a 31 y.o. [id number] at [redacted]w[redacted]d with history of incompetent cervix and preterm delivery. She is here today for a cerclage after discussion of risks and benefits. She denies contractions, LOF, VB and states she does feel fetal movement.          OB History    Grav Para Term Preterm Abortions TAB SAB Ect Mult Living   6 4 2 2 1  0 1 0 0 2     Past Medical History  Diagnosis Date  . Anxiety    Past Surgical History  Procedure Date  . Cesarean section    Family History: family history includes Hypertension in her father and mother.  There is no history of Other. Social History:  reports that she has never smoked. She has never used smokeless tobacco. She reports that she does not drink alcohol or use illicit drugs.  No current facility-administered medications on file prior to encounter.   Current Outpatient Prescriptions on File Prior to Encounter  Medication Sig Dispense Refill  . Prenatal Multivit-Min-Fe-FA (PRENATAL VITAMINS) 0.8 MG tablet Take 1 tablet by mouth daily.  30 tablet  9   No Known Allergies   Review of Systems  Constitutional: Negative for fever and chills.  Eyes: Negative for blurred vision and double vision.  Respiratory: Negative for shortness of breath.   Cardiovascular: Negative for chest pain.  Gastrointestinal: Negative for nausea, vomiting and abdominal pain.  Genitourinary: Negative for dysuria and urgency.  Neurological: Negative for dizziness and headaches.      Blood pressure 111/61, pulse 76, temperature 98.1 F (36.7 C), temperature source Oral, resp. rate 16, last menstrual period 12/24/2011, SpO2 98.00%. Exam Physical Exam  Constitutional: She is oriented to person, place, and time. She appears well-developed and well-nourished. No distress.  HENT:  Head: Normocephalic and atraumatic.    Eyes: Conjunctivae and EOM are normal.  Neck: Normal range of motion. Neck supple.  Cardiovascular: Normal rate, regular rhythm and normal heart sounds.   Respiratory: Effort normal and breath sounds normal.  GI: Soft. Bowel sounds are normal. There is no tenderness. There is no rebound and no guarding.  Musculoskeletal: She exhibits no edema and no tenderness.  Neurological: She is alert and oriented to person, place, and time.  Skin: Skin is warm and dry.  Psychiatric: She has a normal mood and affect.    Prenatal labs: ABO, Rh: B/POS/-- (08/21 1159) Antibody: NEG (08/21 1159) Rubella: 62.4 (08/21 1159) RPR: NON REAC (08/21 1159)  HBsAg: NEGATIVE (08/21 1159)  HIV: NON REACTIVE (08/21 1159)  GBS:     Assessment/Plan: History of preterm delivery and incompetent cervix. Risks and benefits of procedure discussed at length with patient and husband. Consent signed. Proceed with cerclage.  Napoleon Form 04/01/2012, 9:57 AM

## 2012-04-01 NOTE — Preoperative (Signed)
Beta Blockers   Reason not to administer Beta Blockers:Not Applicable 

## 2012-04-01 NOTE — Transfer of Care (Signed)
Immediate Anesthesia Transfer of Care Note  Patient: Teresa Day  Procedure(s) Performed: Procedure(s) (LRB): CERCLAGE CERVICAL (N/A)  Patient Location: PACU  Anesthesia Type: Spinal  Level of Consciousness: awake, alert  and oriented  Airway & Oxygen Therapy: Patient Spontanous Breathing  Post-op Assessment: Report given to PACU RN and Post -op Vital signs reviewed and stable  Post vital signs: Reviewed and stable  Complications: No apparent anesthesia complications

## 2012-04-01 NOTE — Anesthesia Procedure Notes (Signed)
Spinal  Patient location during procedure: OR Start time: 04/01/2012 10:40 AM Staffing Performed by: anesthesiologist  Preanesthetic Checklist Completed: patient identified, site marked, surgical consent, pre-op evaluation, timeout performed, IV checked, risks and benefits discussed and monitors and equipment checked Spinal Block Patient position: sitting Prep: site prepped and draped and DuraPrep Patient monitoring: heart rate, cardiac monitor, continuous pulse ox and blood pressure Approach: midline Location: L3-4 Injection technique: single-shot Needle Needle type: Sprotte  Needle gauge: 24 G Needle length: 9 cm Assessment Sensory level: T4 Additional Notes Clear free flow CSF on first attempt.  No paresthesia.  Patient tolerated procedure well.  Jasmine December, MD

## 2012-04-01 NOTE — Anesthesia Preprocedure Evaluation (Signed)
Anesthesia Evaluation  Patient identified by MRN, date of birth, ID band Patient awake    Reviewed: Allergy & Precautions, H&P , NPO status , Patient's Chart, lab work & pertinent test results, reviewed documented beta blocker date and time   History of Anesthesia Complications Negative for: history of anesthetic complications  Airway       Dental   Pulmonary neg pulmonary ROS,          Cardiovascular negative cardio ROS      Neuro/Psych PSYCHIATRIC DISORDERS (anxiety) negative neurological ROS     GI/Hepatic negative GI ROS, Neg liver ROS,   Endo/Other  negative endocrine ROS  Renal/GU negative Renal ROS  negative genitourinary   Musculoskeletal   Abdominal   Peds  Hematology negative hematology ROS (+)   Anesthesia Other Findings   Reproductive/Obstetrics (+) Pregnancy (incompetent cervix)                           Anesthesia Physical Anesthesia Plan  ASA: II  Anesthesia Plan: Spinal   Post-op Pain Management:    Induction:   Airway Management Planned:   Additional Equipment:   Intra-op Plan:   Post-operative Plan:   Informed Consent: I have reviewed the patients History and Physical, chart, labs and discussed the procedure including the risks, benefits and alternatives for the proposed anesthesia with the patient or authorized representative who has indicated his/her understanding and acceptance.     Plan Discussed with: Surgeon and CRNA  Anesthesia Plan Comments:         Anesthesia Quick Evaluation

## 2012-04-01 NOTE — Op Note (Signed)
Preoperative diagnosis: Incompetent cervix   Postoperative diagnosis: Same  Procedure: Cervical cerclage  Surgeon: Tinnie Gens, M.D.  Anesthesia: Spinal-Cassidy  Findings: Cervix open to fingertip-1 cm dilation  Estimated blood loss: 50 cc  Specimens: None  Reason for procedure: [facility] [id number] [redacted]w[redacted]d, who has two prior second trimester losses after 2 term C-S.  She was counseled and agreed to cervical cerclage with this pregnancy.  Procedure: Patient was taken to the operating room where spinal analgesia was administered. She was prepped and draped in the usual sterile fashion.  A Red Rubber catheter is used to drain her bladder. A timeout was performed. The patient had SCDs in place and had received a gram of Ancef prior to procedure. The patient was in dorsal lithotomy.  A weighted speculum was placed inside the vagina.  A Deaver was used anteriorly. The cervix was grasped with an open ring  forcep. A 5 mm Mersilene band on a cutting needle was used and to put in a pursestring suture. This was started at 12:00 and exiting at 9:00, starting at 9:00 and exiting at 6:00, starting at 6:00 and exiting at 3:00, starting at 3:00 and exiting at 12:00. A suture was then tied down. All instrument, needle and lap counts were correct x 2. The patient was taken to recovery in stable condition.  Malaki Koury SMD 04/01/2012 11:09 AM  04/01/2012 11:07 AM

## 2012-04-04 ENCOUNTER — Encounter (HOSPITAL_COMMUNITY): Payer: Self-pay | Admitting: Family Medicine

## 2012-04-07 ENCOUNTER — Encounter: Payer: Self-pay | Admitting: Obstetrics and Gynecology

## 2012-04-07 ENCOUNTER — Ambulatory Visit (INDEPENDENT_AMBULATORY_CARE_PROVIDER_SITE_OTHER): Payer: Self-pay | Admitting: Obstetrics and Gynecology

## 2012-04-07 VITALS — BP 110/73 | Temp 98.4°F | Wt 168.5 lb

## 2012-04-07 DIAGNOSIS — O3430 Maternal care for cervical incompetence, unspecified trimester: Secondary | ICD-10-CM

## 2012-04-07 LAB — POCT URINALYSIS DIP (DEVICE)
Bilirubin Urine: NEGATIVE
Glucose, UA: NEGATIVE mg/dL
Ketones, ur: NEGATIVE mg/dL
Leukocytes, UA: NEGATIVE
Specific Gravity, Urine: 1.02 (ref 1.005–1.030)

## 2012-04-07 MED ORDER — HYDROXYPROGESTERONE CAPROATE 250 MG/ML IM OIL: 250.0000 mg | TOPICAL_OIL | INTRAMUSCULAR | Status: DC

## 2012-04-07 NOTE — Progress Notes (Signed)
S/P prophylactic cerclage last week. Feels vaginal/pelvic pressure. Discussed sx PTL and lno strenuous activity, pelvic rest. Reassured cx nl with stitch in place, no pressure on stitch. Schedule anotmic [place]. Desires 17P to start at 16 wks

## 2012-04-07 NOTE — Progress Notes (Signed)
Pulse- 73  Pressure-vaginal

## 2012-04-07 NOTE — Patient Instructions (Addendum)
Parto prematuro  (Preterm Labor) El parto prematuro comienza antes de la semana 37 de embarazo. La duracin de un embarazo normal es de 39 a 41 semanas.  CAUSAS  Generalmente no hay una causa que pueda identificarse. Sin embargo, una de las causas conocidas ms frecuentes son las infecciones. Las infecciones del tero, el cuello, la vagina, el lquido amnitico, la vejiga, los riones y hasta de los pulmones (neumona) pueden hacer que el trabajo de parto se inicie. Otras causas son:   Infecciones urogenitales, como infecciones por hongos y vaginosis bacteriana.   Anormalidades uterinas (forma del tero, sptum uterino, fibromas, hemorragias en la placenta).   Un cuello que ha sido operado y se abre prematuramente.   Malformaciones del beb.   Gestaciones mltiples (mellizos, trillizos y ms).   Ruptura del saco amnitico.  :Otros factores de riesgo del parto prematuro son   Historia previa de parto prematuro.   Ruptura prematura de las membranas.   La placenta cubre la apertura del cuello (placenta previa).   La placenta se separa del tero (abrupcin placentaria).   El cuello es demasiado dbil para contener al beb en el tero (cuello incompetente).   Hay mucho lquido en el saco amnitico (polihidramnios).   Consumo de drogas o hbito de fumar durante el embarazo.   No aumentar de peso lo suficiente durante el embarazo.   Mujeres menores de 18 aos o mayores de 35 aos aos.   Nivel socioeconmico bajo.   Raza afroamericana.  SNTOMAS  Los signos y sntomas son:   Clicos del tipo menstrual   Contracciones con un intervalo entre 30 y 70 segundos, comienzan a ser regulares, se hacen ms frecuentes y se hacen ms intensas y dolorosas.   Contracciones que comienzan en la parte superior del tero y se expanden hacia abajo, hacia la zona inferior del abdomen y la espalda.   Sensacin de presin en la pelvis o dolor en la espalda.   Aparece una secrecin acuosa o  sanguinolenta por la vagina.  DIAGNSTICO  El diagnstico puede confirmarse:   Con un examen vaginal.   Ecografa del cuello.   Muestra (hisopado) de las secreciones crvico-vaginales. Estas muestras se analizan para buscar la presencia de fibronectina fetal. Esta protena que se encuentra en las secreciones del tero y se asocia con el parto prematuro.   Monitoreo fetal  TRATAMIENTO  Segn el tiempo del embarazo y otras circunstancias, el mdico puede indicar reposo en cama. Si es necesario, le indicarn medicamentos para detener las contracciones y apurar la maduracin de los pulmones del feto. Si el trabajo de parto se inicia antes de las 34 semanas de embarazo, se recomienda la hospitalizacin. El tratamiento depende de las condiciones en que se encuentre la madre y el beb.  PREVENCIN  Hay algunas cosas que una madre puede hacer para disminuir el riesgo de trabajo de parto prematuro en futuros embarazos. Una mam puede:   Dejar de fumar.   Mantener un peso saludable y evitar sustancias qumicas y drogas innecesarias.   Controlar todo tipo de infeccin.   Informar al mdico si tiene una historia conocida de parto prematuro.  Document Released: 10/27/2007 Document Revised: 07/09/2011 [employer] Patient Information 2012 [employer]. 

## 2012-04-07 NOTE — Progress Notes (Signed)
U/S scheduled Sept. 26, 2013 at 930 am.

## 2012-04-09 NOTE — Anesthesia Postprocedure Evaluation (Signed)
Anesthesia Post Note  Patient: Teresa Day  Procedure(s) Performed: Procedure(s) (LRB): CERCLAGE CERVICAL (N/A)  Anesthesia type: Spinal  Patient location: PACU  Post pain: Pain level controlled  Post assessment: Post-op Vital signs reviewed  Last Vitals:  Filed Vitals:   04/01/12 1245  BP: 110/60  Pulse: 79  Temp: 36.6 C  Resp: 20    Post vital signs: Reviewed  Level of consciousness: awake  Complications: No apparent anesthesia complications

## 2012-04-14 ENCOUNTER — Ambulatory Visit (INDEPENDENT_AMBULATORY_CARE_PROVIDER_SITE_OTHER): Payer: Self-pay

## 2012-04-14 VITALS — BP 122/73 | HR 86 | Wt 172.1 lb

## 2012-04-14 DIAGNOSIS — O09219 Supervision of pregnancy with history of pre-term labor, unspecified trimester: Secondary | ICD-10-CM

## 2012-04-14 MED ORDER — HYDROXYPROGESTERONE CAPROATE 250 MG/ML IM OIL
250.0000 mg | TOPICAL_OIL | INTRAMUSCULAR | Status: DC
Start: 2012-04-14 — End: 2012-04-14
  Administered 2012-04-14: 250 mg via INTRAMUSCULAR

## 2012-04-21 ENCOUNTER — Ambulatory Visit (INDEPENDENT_AMBULATORY_CARE_PROVIDER_SITE_OTHER): Payer: Self-pay | Admitting: Obstetrics and Gynecology

## 2012-04-21 ENCOUNTER — Encounter: Payer: Self-pay | Admitting: Obstetrics and Gynecology

## 2012-04-21 ENCOUNTER — Ambulatory Visit (INDEPENDENT_AMBULATORY_CARE_PROVIDER_SITE_OTHER): Payer: Self-pay | Admitting: *Deleted

## 2012-04-21 VITALS — BP 116/70 | Temp 98.0°F | Wt 173.9 lb

## 2012-04-21 VITALS — BP 116/70 | HR 92 | Wt 174.0 lb

## 2012-04-21 DIAGNOSIS — O09219 Supervision of pregnancy with history of pre-term labor, unspecified trimester: Secondary | ICD-10-CM

## 2012-04-21 DIAGNOSIS — O3430 Maternal care for cervical incompetence, unspecified trimester: Secondary | ICD-10-CM

## 2012-04-21 MED ORDER — HYDROXYPROGESTERONE CAPROATE 250 MG/ML IM OIL
250.0000 mg | TOPICAL_OIL | INTRAMUSCULAR | Status: DC
  Administered 2012-04-21: 250 mg via INTRAMUSCULAR

## 2012-04-21 NOTE — Progress Notes (Signed)
Had 17 P. Wetness in underwear x 2 d, no gush or odor. Spec: scant thin white d/c, fern neg. Stitch intact. [place] 9/26.

## 2012-04-21 NOTE — Progress Notes (Signed)
P = 92 Occasional, mild pain in lower abdomen + LOF

## 2012-04-21 NOTE — Patient Instructions (Signed)
Embarazo - Segundo trimestre (Pregnancy - Second Trimester) El segundo trimestre del embarazo (del 3 al 6mes) es un perodo de evolucin rpida para usted y el beb. Hacia el final del sexto mes, el beb mide aproximadamente 23 cm y pesa 680 g. Comenzar a sentir los movimientos del beb entre las 18 y las 20 semanas de embarazo. Podr sentir las pataditas ("quickening en ingls"). Hay un rpido aumento de peso. Puede segregar un lquido claro (calostro) de las mamas. Quizs sienta pequeas contracciones en el vientre (tero) Esto se conoce como falso trabajo de parto o contracciones de Braxton-Hicks. Es como una prctica del trabajo de parto que se produce cuando el beb est listo para salir. Generalmente los problemas de vmitos matinales ya se han superado hacia el final del primer trimestre. Algunas mujeres desarrollan pequeas manchas oscuras (que se denominan cloasma, mscara del embarazo) en la cara que normalmente se van luego del nacimiento del beb. La exposicin al sol empeora las manchas. Puede desarrollarse acn en algunas mujeres embarazadas, y puede desaparecer en aquellas que ya tienen acn. EXAMENES PRENATALES  Durante los exmenes prenatales, deber seguir realizando pruebas de sangre, segn avance el embarazo. Estas pruebas se realizan para controlar su salud y la del beb. Tambin se realizan anlisis de sangre para conocer los niveles de hemoglobina. La anemia (bajo nivel de hemoglobina) es frecuente durante el embarazo. Para prevenirla, se administran hierro y vitaminas. Tambin se le realizarn exmenes para saber si tiene diabetes entre las 24 y las 28 semanas del embarazo. Podrn repetirle algunas de las pruebas que le hicieron previamente.   En cada visita le medirn el tamao del tero. Esto se realiza para asegurarse de que el beb est creciendo correctamente de acuerdo al estado del embarazo.   Tambin en cada visita prenatal controlarn su presin arterial. Esto se realiza  para asegurarse de que no tenga toxemia.   Se controlar su orina para asegurarse de que no tenga infecciones, diabetes o protena en la orina.   Se controlar su peso regularmente para asegurarse que el aumento ocurre al ritmo indicado. Esto se hace para asegurarse que usted y el beb tienen una evolucin normal.   En algunas ocasiones se realiza una prueba de ultrasonido para confirmar el correcto desarrollo y evolucin del beb. Esta prueba se realiza con ondas sonoras inofensivas para el beb, de modo que el profesional pueda calcular ms precisamente la fecha del parto.  Algunas veces se realizan pruebas especializadas del lquido amnitico que rodea al beb. Esta prueba se denomina amniocentesis. El lquido amnitico se obtiene introduciendo una aguja en el vientre (abdomen). Se realiza para controlar los cromosomas en aquellos casos en los que existe alguna preocupacin acerca de algn problema gentico que pueda sufrir el beb. En ocasiones se lleva a cabo cerca del final del embarazo, si es necesario inducir al parto. En este caso se realiza para asegurarse que los pulmones del beb estn lo suficientemente maduros como para que pueda vivir fuera del tero. CAMBIOS QUE OCURREN EN EL SEGUNDO TRIMESTRE DEL EMBARAZO Su organismo atravesar numerosos cambios durante el embarazo. Estos pueden variar de una persona a otra. Converse con el profesional que la asiste acerca los cambios que usted note y que la preocupen.  Durante el segundo trimestre probablemente sienta un aumento del apetito. Es normal tener "antojos" de ciertas comidas. Esto vara de una persona a otra y de un embarazo a otro.   El abdomen inferior comenzar a abultarse.   Podr tener la necesidad   de orinar con ms frecuencia debido a que el tero y el beb presionan sobre la vejiga. Tambin es frecuente contraer ms infecciones urinarias durante el embarazo (dolor al orinar). Puede evitarlas bebiendo gran cantidad de lquidos y  vaciando la vejiga antes y despus de mantener relaciones sexuales.   Podrn aparecer las primeras estras en las caderas, abdomen y mamas. Estos son cambios normales del cuerpo durante el embarazo. No existen medicamentos ni ejercicios que puedan prevenir estos cambios.   Es posible que comience a desarrollar venas inflamadas y abultadas (varices) en las piernas. El uso de medias de descanso, elevar sus pies durante 15 minutos, 3 a 4 veces al da y limitar la sal en su dieta ayuda a aliviar el problema.   Podr sentir acidez gstrica a medida que el tero crece y presiona contra el estmago. Puede tomar anticidos, con la autorizacin de su mdico, para aliviar este problema. Tambin es til ingerir pequeas comidas 4 a 5 veces al da.   La constipacin puede tratarse con un laxante o agregando fibra a su dieta. Beber grandes cantidades de lquidos, comer vegetales, frutas y granos integrales es de gran ayuda.   Tambin es beneficioso practicar actividad fsica. Si ha sido una persona activa hasta el embarazo, podr continuar con la mayora de las actividades durante el mismo. Si ha sido menos activa, puede ser beneficioso que comience con un programa de ejercicios, como realizar caminatas.   Puede desarrollar hemorroides (vrices en el recto) hacia el final del segundo trimestre. Tomar baos de asiento tibios y utilizar cremas recomendadas por el profesional que lo asiste sern de ayuda para los problemas de hemorroides.   Tambin podr sentir dolor de espalda durante este momento de su embarazo. Evite levantar objetos pesados, utilice zapatos de taco bajo y mantenga una buena postura para ayudar a reducir los problemas de espalda.   Algunas mujeres embarazadas desarrollan hormigueo y adormecimiento de la mano y los dedos debido a la hinchazn y compresin de los ligamentos de la mueca (sndrome del tnel carpiano). Esto desaparece una vez que el beb nace.   Como sus pechos se agrandan,  necesitar un sujetador ms grande. Use un sostn de soporte, cmodo y de algodn. No utilice un sostn para amamantar hasta el ltimo mes de embarazo si va a amamantar al beb.   Podr observar una lnea oscura desde el ombligo hacia la zona pbica denominada linea nigra.   Podr observar que sus mejillas se ponen coloradas debido al aumento de flujo sanguneo en la cara.   Podr desarrollar "araitas" en la cara, cuello y pecho. Esto desaparece una vez que el beb nace.  INSTRUCCIONES PARA EL CUIDADO DOMICILIARIO  Es extremadamente importante que evite el cigarrillo, hierbas medicinales, alcohol y las drogas no prescriptas durante el embarazo. Estas sustancias qumicas afectan la formacin y el desarrollo del beb. Evite estas sustancias durante todo el embarazo para asegurar el nacimiento de un beb sano.   La mayor parte de los cuidados que se aconsejan son los mismos que los indicados para el primer trimestre del embarazo. Cumpla con las citas tal como se le indic. Siga las instrucciones del profesional que lo asiste con respecto al uso de los medicamentos, el ejercicio y la dieta.   Durante el embarazo debe obtener nutrientes para usted y para su beb. Consuma alimentos balanceados a intervalos regulares. Elija alimentos como carne, pescado, leche y otros productos lcteos descremados, vegetales, frutas, panes integrales y cereales. El profesional le informar cul es el   aumento de peso ideal.   Las relaciones sexuales fsicas pueden continuarse hasta cerca del fin del embarazo si no existen otros problemas. Estos problemas pueden ser la prdida temprana (prematura) de lquido amnitico de las membranas, sangrado vaginal, dolor abdominal u otros problemas mdicos o del embarazo.   Realice actividad fsica todos los das, si no tiene restricciones. Consulte con el profesional que la asiste si no sabe con certeza si determinados ejercicios son seguros. El mayor aumento de peso tiene lugar  durante los ltimos 2 trimestres del embarazo. El ejercicio la ayudar a:   Controlar su peso.   Ponerla en forma para el parto.   Ayudarla a perder peso luego de haber dado a luz.   Use un buen sostn o como los que se usan para hacer deportes para aliviar la sensibilidad de las mamas. Tambin puede serle til si lo usa mientras duerme. Si pierde calostro, podr utilizar apsitos en el sostn.   No utilice la baera con agua caliente, baos turcos y saunas durante el embarazo.   Utilice el cinturn de seguridad sin excepcin cuando conduzca. Este la proteger a usted y al beb en caso de accidente.   Evite comer carne cruda, queso crudo, y el contacto con los utensilios y desperdicios de los gatos. Estos elementos contienen grmenes que pueden causar defectos de nacimiento en el beb.   El segundo trimestre es un buen momento para visitar a su dentista y evaluar su salud dental si an no lo ha hecho. Es importante mantener los dientes limpios. Utilice un cepillo de dientes blando. Cepllese ms suavemente durante el embarazo.   Es ms fcil perder algo de orina durante el embarazo. Apretar y fortalecer los msculos de la pelvis la ayudar con este problema. Practique detener la miccin cuando est en el bao. Estos son los mismos msculos que necesita fortalecer. Son tambin los mismos msculos que utiliza cuando trata de evitar los gases. Puede practicar apretando estos msculos 10 veces, y repetir esto 3 veces por da aproximadamente. Una vez que conozca qu msculos debe apretar, no realice estos ejercicios durante la miccin. Puede favorecerle una infeccin si la orina vuelve hacia atrs.   Pida ayuda si tiene necesidades econmicas, de asesoramiento o nutricionales durante el embarazo. El profesional podr ayudarla con respecto a estas necesidades, o derivarla a otros especialistas.   La piel puede ponerse grasa. Si esto sucede, lvese la cara con un jabn suave, utilice un humectante no  graso y maquillaje con base de aceite o crema.  CONSUMO DE MEDICAMENTOS Y DROGAS DURANTE EL EMBARAZO  Contine tomando las vitaminas apropiadas para esta etapa tal como se le indic. Las vitaminas deben contener un miligramo de cido flico y deben suplementarse con hierro. Guarde todas las vitaminas fuera del alcance de los nios. La ingestin de slo un par de vitaminas o tabletas que contengan hierro puede ocasionar la muerte en un beb o en un nio pequeo.   Evite el uso de medicamentos, inclusive los de venta libre y hierbas que no hayan sido prescriptos o indicados por el profesional que la asiste. Algunos medicamentos pueden causar problemas fsicos al beb. Utilice los medicamentos de venta libre o de prescripcin para el dolor, el malestar o la fiebre, segn se lo indique el profesional que lo asiste. No utilice aspirina.   El consumo de alcohol est relacionado con ciertos defectos de nacimiento. Esto incluye el sndrome de alcoholismo fetal. Debe evitar el consumo de alcohol en cualquiera de sus formas. El cigarrillo   causa nacimientos prematuros y bebs de bajo peso. El uso de drogas recreativas est absolutamente prohibido. Son muy nocivas para el beb. Un beb que nace de una madre adicta, ser adicto al nacer. Ese beb tendr los mismos sntomas de abstinencia que un adulto.   Infrmele al profesional si consume alguna droga.   No consuma drogas ilegales. Pueden causarle mucho dao al beb.  SOLICITE ATENCIN MDICA SI: Tiene preguntas o preocupaciones durante su embarazo. Es mejor que llame para consultar las dudas que esperar hasta su prxima visita prenatal. De esta forma se sentir ms tranquila.  SOLICITE ATENCIN MDICA DE INMEDIATO SI:  La temperatura oral se eleva sin motivo por encima de 102 F (38.9 C) o segn le indique el profesional que lo asiste.   Tiene una prdida de lquido por la vagina (canal de parto). Si sospecha una ruptura de las membranas, tmese la  temperatura y llame al profesional para informarlo sobre esto.   Observa unas pequeas manchas, una hemorragia vaginal o elimina cogulos. Notifique al profesional acerca de la cantidad y de cuntos apsitos est utilizando. Unas pequeas manchas de sangre son algo comn durante el embarazo, especialmente despus de mantener relaciones sexuales.   Presenta un olor desagradable en la secrecin vaginal y observa un cambio en el color, de transparente a blanco.   Contina con las nuseas y no obtiene alivio de los remedios indicados. Vomita sangre o algo similar a la borra del caf.   [place] o sube ms de 900 g. en una semana, o segn lo indicado por el profesional que la asiste.   Observa que se le hinchan el rostro, las manos, los pies o las piernas.   Ha estado expuesta a la rubola y no ha sufrido la enfermedad.   Ha estado expuesta a la quinta enfermedad o a la varicela.   Presenta dolor abdominal. Las molestias en el ligamento redondo son una causa no cancerosa (benigna) frecuente de dolor abdominal durante el embarazo. El profesional que la asiste deber evaluarla.   Presenta dolor de cabeza intenso que no se alivia.   Presenta fiebre, diarrea, dolor al orinar o le falta la respiracin.   Presenta dificultad para ver, visin borrosa, o visin doble.   Sufre una cada, un accidente de trnsito o cualquier tipo de trauma.   Vive en un hogar en el que existe violencia fsica o mental.  Document Released: 04/29/2005 Document Revised: 07/09/2011 [employer] Patient Information 2012 [employer]. 

## 2012-04-28 ENCOUNTER — Ambulatory Visit (HOSPITAL_COMMUNITY)
Admission: RE | Admit: 2012-04-28 | Discharge: 2012-04-28 | Disposition: A | Discharge status: Home / Self Care | Payer: Self-pay | Source: Ambulatory Visit | Attending: Obstetrics and Gynecology | Admitting: Obstetrics and Gynecology

## 2012-04-28 ENCOUNTER — Ambulatory Visit (INDEPENDENT_AMBULATORY_CARE_PROVIDER_SITE_OTHER): Payer: Self-pay | Admitting: Medical

## 2012-04-28 VITALS — BP 110/73 | HR 77

## 2012-04-28 DIAGNOSIS — O09219 Supervision of pregnancy with history of pre-term labor, unspecified trimester: Secondary | ICD-10-CM

## 2012-04-28 DIAGNOSIS — O09299 Supervision of pregnancy with other poor reproductive or obstetric history, unspecified trimester: Secondary | ICD-10-CM | POA: Insufficient documentation

## 2012-04-28 DIAGNOSIS — O3430 Maternal care for cervical incompetence, unspecified trimester: Secondary | ICD-10-CM

## 2012-04-28 DIAGNOSIS — E669 Obesity, unspecified: Secondary | ICD-10-CM | POA: Insufficient documentation

## 2012-04-28 MED ORDER — HYDROXYPROGESTERONE CAPROATE 250 MG/ML IM OIL
250.0000 mg | TOPICAL_OIL | INTRAMUSCULAR | Status: DC
  Administered 2012-04-28: 250 mg via INTRAMUSCULAR

## 2012-05-05 ENCOUNTER — Ambulatory Visit (INDEPENDENT_AMBULATORY_CARE_PROVIDER_SITE_OTHER): Payer: Self-pay | Admitting: Family Medicine

## 2012-05-05 ENCOUNTER — Encounter: Payer: Self-pay | Admitting: Family Medicine

## 2012-05-05 VITALS — BP 99/74 | Temp 97.1°F | Wt 173.9 lb

## 2012-05-05 DIAGNOSIS — B373 Candidiasis of vulva and vagina: Secondary | ICD-10-CM

## 2012-05-05 DIAGNOSIS — O09899 Supervision of other high risk pregnancies, unspecified trimester: Secondary | ICD-10-CM

## 2012-05-05 DIAGNOSIS — B3731 Acute candidiasis of vulva and vagina: Secondary | ICD-10-CM

## 2012-05-05 DIAGNOSIS — O09219 Supervision of pregnancy with history of pre-term labor, unspecified trimester: Secondary | ICD-10-CM

## 2012-05-05 DIAGNOSIS — O3430 Maternal care for cervical incompetence, unspecified trimester: Secondary | ICD-10-CM

## 2012-05-05 DIAGNOSIS — Z23 Encounter for immunization: Secondary | ICD-10-CM

## 2012-05-05 LAB — POCT URINALYSIS DIP (DEVICE)
Bilirubin Urine: NEGATIVE
Ketones, ur: NEGATIVE mg/dL
Nitrite: NEGATIVE
Protein, ur: NEGATIVE mg/dL
pH: 5.5 (ref 5.0–8.0)

## 2012-05-05 MED ORDER — INFLUENZA VIRUS VACC SPLIT PF IM SUSP
0.5000 mL | Freq: Once | INTRAMUSCULAR | Status: AC
  Administered 2012-05-05: 0.5 mL via INTRAMUSCULAR

## 2012-05-05 MED ORDER — HYDROXYPROGESTERONE CAPROATE 250 MG/ML IM OIL
250.0000 mg | TOPICAL_OIL | INTRAMUSCULAR | Status: DC
  Administered 2012-05-05: 250 mg via INTRAMUSCULAR

## 2012-05-05 MED ORDER — FLUCONAZOLE 150 MG PO TABS: 150.0000 mg | ORAL_TABLET | Freq: Every day | ORAL | Status: DC

## 2012-05-05 NOTE — Progress Notes (Signed)
P=84 , Used Interpreter [employer]. Discussed flu shot, desires shot today.

## 2012-05-05 NOTE — Patient Instructions (Addendum)
Ciática  °(Sciatica) ° La ciática es el dolor, debilidad, entumecimiento u hormigueo a lo largo del nervio ciático. El nervio comienza en la zona inferior de la espalda y desciende por la parte posterior de cada pierna. El nervio controla los músculos de la parte inferior de la pierna y de la zona posterior de la rodilla, y transmite la sensibilidad a la parte posterior del muslo, la pierna y la planta del pie. La ciática es un síntoma de otras afecciones médicas. Por ejemplo, un daño a los nervios o algunas enfermedades como un disco herniado o un espolón óseo en la columna vertebral, podrían dañarle o presionar en el nervio ciático. Esto causa dolor, debilidad y otras sensaciones normalmente asociadas con la ciática. Generalmente la ciática afecta sólo un lado del cuerpo. °CAUSAS  °· Disco herniado o desplazado. °· Enfermedad degenerativa del disco. °· Un síndrome doloroso que compromete un músculo angosto de los glúteos (síndrome piriforme). °· Lesión o fractura pélvica. °· Embarazo. °· Tumor (casos raros). °SÍNTOMAS  °Los síntomas pueden variar de leves a muy graves. Por lo general, los síntomas descienden desde la zona lumbar a las nalgas y la parte posterior de la pierna. Ellos son:  °· Hormigueo leve o dolor sordo en la parte inferior de la espalda, la pierna o la cadera. °· Adormecimiento en la parte posterior de la pantorrilla o la planta del pie. °· Sensación de quemazón en la zona lumbar, la pierna o la cadera. °· Dolor agudo en la zona inferior de la espalda, la pierna o la cadera. °· Debilidad en las piernas. °· Dolor de espalda intenso que inhibe los movimientos. °Los síntomas pueden empeorar al toser, estornudar, reír o estar sentado o parado durante mucho tiempo. Además, el sobrepeso puede empeorar los síntomas.  °DIAGNÓSTICO  °Su médico le hará un examen físico para buscar los síntomas comunes de la ciática. Le pedirá que haga algunos movimientos o actividades que activarían el dolor del nervio  ciático. Para encontrar las causas de la ciática podrá indicarle otros estudios. Estos pueden ser:  °· Análisis de sangre. °· Radiografías. °· Pruebas de diagnóstico por imágenes, como resonancia magnética o tomografía computada. °TRATAMIENTO  °El tratamiento se dirige a las causas de la ciática. A veces, el tratamiento no es necesario, y el dolor y el malestar desaparecen por sí mismos. Si necesita tratamiento, su médico puede sugerir:  °· Medicamentos de venta libre para aliviar el dolor. °· Medicamentos recetados, como antiinflamatorios, relajantes musculares o narcóticos. °· Aplicación de calor o hielo en la zona del dolor. °· Inyecciones de corticoides para disminuir el dolor, la irritación y la inflamación alrededor del nervio. °· Reducción de la actividad en los períodos de dolor. °· Ejercicios y estiramiento del abdomen para fortalecer y mejorar la flexibilidad de la columna vertebral. Su médico puede sugerirle perder peso si el peso extra empeora el dolor de espalda. °· Fisioterapia. °· La cirugía para eliminar lo que presiona o pincha el nervio, como un espolón óseo o parte de una hernia de disco. °INSTRUCCIONES PARA EL CUIDADO EN EL HOGAR  °· Sólo tome medicamentos de venta libre o recetados para calmar el dolor o el malestar, según las indicaciones de su médico. °· Aplique hielo sobre el área dolorida durante 20 minutos 3-4 veces por día durante los primeras 48-72 horas. Luego intente aplicar calor de la misma manera. °· Haga ejercicios, elongue o realice sus actividades habituales, si no le causan más dolor. °· Cumpla con todas las sesiones de fisioterapia, según le   indique su mdico.  Cumpla con todas las visitas de control, segn le indique su mdico.  No use tacones altos o zapatos que no tengan buen apoyo.  Verifique que el colchn no sea muy blando. Un colchn firme Engineer, materials y las [place]. SOLICITE ATENCIN MDICA DE INMEDIATO SI:   Pierde el control de la vejiga o del intestino  (incontinencia).  Aumenta la debilidad en la zona inferior de la espalda, la pelvis, las nalgas o las piernas.  Siente irritacin o inflamacin en la espalda.  Tiene sensacin de ardor al [employer]ocoPhillips.  El dolor empeora cuando se acuesta o lo despierta por la noche.  El dolor es peor del que experiment en el pasado.  Dura ms de 4 semanas.  Pierde peso sin motivo de [place] sbita. ASEGRESE DE QUE:   Comprende estas instrucciones.  Controlar su enfermedad.  Solicitar ayuda de inmediato si no mejora o si empeora. Document Released: 07/20/2005 Document Revised: 01/19/2012 [facility] Patient Information 2013 [place], [place].  Embarazo - Segundo trimestre (Pregnancy - Second Trimester) El segundo trimestre del Psychiatrist (del 3 al ) es un perodo de evolucin rpida para usted y el beb. Hacia el final del sexto mes, el beb mide aproximadamente 23 cm y pesa 680 g. Comenzar a Pharmacologist del beb [place] 18 y las 20 [address] de [place]. Podr sentir las pataditas ("quickening en ingls"). Hay un rpido [employer]. Puede segregar un lquido claro Charity fundraiser) de las [place]. Quizs sienta pequeas contracciones en el vientre (tero) Esto se conoce como falso trabajo de parto o contracciones de [place]. Es como una prctica del trabajo de parto que se produce cuando el beb est listo para salir. Generalmente los problemas de vmitos matinales ya se han superado hacia el final del Medical sales representative. Algunas mujeres desarrollan pequeas manchas oscuras (que se denominan cloasma, mscara del embarazo) en la cara que normalmente se van luego del nacimiento del beb. La exposicin al sol empeora las manchas. Puede desarrollarse acn en algunas mujeres embarazadas, y puede desaparecer en aquellas que ya tienen acn. EXAMENES PRENATALES  Durante los [employer], deber seguir realizando pruebas de [place], segn avance el [place]. Estas pruebas se realizan para controlar  su salud y la del beb. Tambin se realizan anlisis de sangre para [employer] niveles de [place]. La anemia (bajo nivel de hemoglobina) es frecuente durante el embarazo. Para prevenirla, se administran hierro y vitaminas. Tambin se le realizarn exmenes para saber si tiene diabetes entre las 24 y las 28 semanas del [place]. Podrn repetirle algunas de las H[employer] Enterprises hicieron previamente.  En cada visita le medirn el tamao del tero. Esto se realiza para asegurarse de que el beb est creciendo correctamente de acuerdo al estado del [place].  Tambin en cada visita prenatal controlarn su presin arterial. Esto se realiza para asegurarse de que no tenga toxemia.  Se controlar su orina para asegurarse de que no tenga infecciones, diabetes o protena en la orina.  Se controlar su peso regularmente para asegurarse que el aumento ocurre al ritmo indicado. Esto se hace para asegurarse que usted y el beb tienen una evolucin normal.  En algunas ocasiones se realiza una prueba de ultrasonido para confirmar el correcto desarrollo y evolucin del beb. Esta prueba se realiza con ondas sonoras inofensivas para el beb, de modo que el profesional pueda calcular ms precisamente la fecha del [place]. Algunas veces se realizan pruebas especializadas del lquido amnitico que rodea al beb. Esta prueba se denomina  amniocentesis. El lquido amnitico se obtiene introduciendo una aguja en el vientre (abdomen). Se realiza para Conservator, museum/gallery en los que existe alguna preocupacin acerca de algn problema gentico que pueda sufrir el beb. En ocasiones se lleva a cabo cerca del final del embarazo, si es necesario inducir al Apple Computer. En este caso se realiza para asegurarse que los pulmones del beb estn lo suficientemente maduros como para que pueda vivir fuera del tero. CAMBIOS QUE OCURREN EN EL SEGUNDO TRIMESTRE DEL EMBARAZO Su organismo atravesar numerosos cambios durante el  [employer]. Estos pueden variar de Neomia Dear persona a otra. [place] con el profesional que la asiste acerca los cambios que usted note y que la preocupen.  Durante el segundo trimestre probablemente sienta un aumento del apetito. Es normal tener "antojos" de Development worker, community. Esto vara de Neomia Dear persona a otra y de un embarazo a Therapist, art.  El abdomen inferior comenzar a abultarse.  Podr tener la necesidad de Geographical information systems officer con ms frecuencia debido a que el tero y el beb presionan sobre la vejiga. Tambin es frecuente contraer ms infecciones urinarias durante el embarazo (dolor al ConocoPhillips). Puede evitarlas bebiendo gran cantidad de lquidos y vaciando la vejiga antes y despus de Sales promotion account executive.  Podrn aparecer las primeras estras en las caderas, abdomen y [place]. Estos son cambios normales del cuerpo durante el [place]. No existen medicamentos ni ejercicios que puedan prevenir [employer].  Es posible que comience a desarrollar venas inflamadas y abultadas (varices) en las piernas. El uso de medias de descanso, Optometrist sus pies durante 15 minutos, 3 a 4 veces al da y Film/video editor la sal en su dieta ayuda a Journalist, newspaper.  Podr sentir Engineering geologist gstrica a medida que el tero crece y Doctor, general practice. Puede tomar anticidos, con la autorizacin de su mdico, para Financial planner. Tambin es til ingerir pequeas comidas 4 a 5 veces al Futures trader.  La constipacin puede tratarse con un laxante o agregando fibra a su dieta. Beber grandes cantidades de lquidos, comer vegetales, frutas y granos integrales es de [place].  Tambin es beneficioso practicar actividad fsica. Si ha sido una persona Engineer, mining, podr continuar con la Har[employer]son de las actividades durante el mismo. Si ha sido [employer], puede ser beneficioso que comience con un programa de ejercicios, Museum/gallery exhibitions officer.  Puede desarrollar hemorroides (vrices en el recto) hacia el final del segundo trimestre.  Tomar baos de asiento tibios y Chemical engineer cremas recomendadas por el profesional que lo asiste sern de ayuda para los problemas de hemorroides.  Tambin podr Financial risk analyst de espalda durante este momento de su embarazo. Evite levantar objetos pesados, utilice zapatos de taco bajo y [place] buena postura para ayudar a reducir los problemas de [place].  Algunas mujeres embarazadas desarrollan hormigueo y adormecimiento de la mano y los dedos debido a la hinchazn y compresin de los ligamentos de la mueca (sndrome del tnel carpiano). Esto desaparece una vez que el beb nace.  Como sus pechos se agrandan, Pension scheme manager un sujetador ms grande. Use un sostn de soporte, cmodo y de algodn. No utilice un sostn para amamantar hasta el ltimo mes de embarazo si va a amamantar al beb.  Podr observar una lnea oscura desde el ombligo hacia la zona pbica denominada linea nigra.  Podr observar que sus mejillas se ponen coloradas debido al aumento de flujo sanguneo en la cara.  Podr desarrollar "araitas" en la cara, cuello y pecho. Esto desaparece Neomia Dear  vez que el beb nace. INSTRUCCIONES PARA EL CUIDADO DOMICILIARIO  Es extremadamente importante que evite el cigarrillo, hierbas medicinales, alcohol y las drogas no prescriptas durante el Psychiatrist. Estas sustancias qumicas afectan la formacin y el desarrollo del beb. Evite estas sustancias durante todo el embarazo para asegurar el nacimiento de un beb sano.  La mayor parte de los cuidados que se aconsejan son los mismos que los indicados para Financial risk analyst trimestre del Psychiatrist. Cumpla con las citas tal como se le indic. Siga las instrucciones del profesional que lo asiste con respecto al uso de los medicamentos, el ejercicio y Psychologist, forensic.  Durante el embarazo debe obtener nutrientes para usted y para su beb. Consuma alimentos balanceados a intervalos regulares. Elija alimentos como carne, pescado, [place] y otros productos lcteos descremados,  vegetales, frutas, panes integrales y cereales. El Equities trader cul es el aumento de peso ideal.  Las relaciones sexuales fsicas pueden continuarse hasta cerca del fin del embarazo si no existen otros problemas. Estos problemas pueden ser la prdida temprana (prematura) de lquido amnitico de las [place], sangrado vaginal, dolor abdominal u otros problemas mdicos o del Psychiatrist.  Realice Tesoro Corporation, si no tiene restricciones. Consulte con el profesional que la asiste si no sabe con certeza si determinados ejercicios son seguros. El mayor aumento de peso tiene Environmental consultant durante los ltimos 2 trimestres del Psychiatrist. El ejercicio la ayudar a:  Engineering geologist.  Ponerla en forma para el parto.  Ayudarla a perder peso luego de haber dado a luz.  Use un buen sostn o como los que se usan para hacer deportes para Paramedic la sensibilidad de las [place]. Tambin puede serle til si lo [place] mientras duerme. Si pierde Product manager, podr [employer]in.  No utilice la baera con agua caliente, baos turcos y saunas durante el [address]  Utilice el cinturn de seguridad sin excepcin cuando conduzca. Este la proteger a usted y al beb en caso de accidente.  Evite comer carne cruda, queso crudo, y el contacto con los utensilios y desperdicios de los gatos. Estos elementos contienen grmenes que pueden causar defectos de nacimiento en el beb.  El segundo trimestre es un buen momento para visitar a su dentista y Software engineer si an no lo ha hecho. Es Primary school teacher los dientes limpios. Utilice un cepillo de dientes blando. Cepllese ms suavemente durante el embarazo.  Es ms fcil perder algo de orina durante el [place]. Apretar y Chief Operating Officer los msculos de la pelvis la ayudar con este problema. Practique detener la miccin cuando est en el bao. Estos son los mismos msculos que Development worker, international aid. Son [employer] mismos msculos que  utiliza cuando trata de [employer] gases. Puede practicar apretando estos msculos 10 veces, y repetir esto 3 veces por da aproximadamente. Una vez que conozca qu msculos debe apretar, no realice estos ejercicios durante la miccin. Puede favorecerle una infeccin si la orina vuelve hacia atrs.  Pida ayuda si tiene necesidades econmicas, de asesoramiento o nutricionales durante el [place]. El profesional podr ayudarla con respecto a estas necesidades, o derivarla a otros especialistas.  La piel puede ponerse grasa. Si esto sucede, lvese la cara con un jabn [place], utilice un humectante no graso y [place] con base de aceite o crema. CONSUMO DE MEDICAMENTOS Y DROGAS DURANTE EL EMBARAZO  Contine tomando las vitaminas apropiadas para esta etapa tal como se le indic. Las vitaminas deben contener un miligramo de cido flico y deben  suplementarse con hierro. Guarde todas las vitaminas fuera del alcance de los nios. La ingestin de slo un par de vitaminas o tabletas que contengan hierro puede ocasionar la [employer] en un beb o en un nio pequeo.  Evite el uso de [place], inclusive los de venta libre y hierbas que no hayan sido prescriptos o indicados por el profesional que la asiste. Algunos medicamentos pueden causar problemas fsicos al beb. Utilice los medicamentos de venta libre o de prescripcin para Chief Technology Officer, Environmental health practitioner o la [place], segn se lo indique el profesional que lo asiste. No utilice aspirina.  El consumo de alcohol est relacionado con ciertos defectos de nacimiento. Esto incluye el sndrome de alcoholismo fetal. Debe evitar el consumo de alcohol en cualquiera de sus formas. El cigarrillo causa nacimientos prematuros y bebs de [place]. El uso de drogas recreativas est absolutamente prohibido. Son muy nocivas para el beb. Un beb que nace de [employer], ser adicto al nacer. Ese beb tendr los mismos sntomas de abstinencia que un adulto.  Infrmele al  profesional si consume alguna droga.  No consuma drogas ilegales. Pueden causarle mucho dao al beb. SOLICITE ATENCIN MDICA SI: Tiene preguntas o preocupaciones durante su embarazo. Es mejor que llame para Science writer las dudas que esperar hasta su prxima visita prenatal. Thressa Sheller forma se sentir ms tranquila.  SOLICITE ATENCIN MDICA DE INMEDIATO SI:  La temperatura oral se eleva sin motivo por encima de 102 F (38.9 C) o segn le indique el profesional que lo asiste.  Tiene una prdida de lquido por la vagina (canal de parto). Si sospecha una ruptura de las [place], tmese la temperatura y llame al profesional para informarlo sobre esto.  Observa unas pequeas manchas, una hemorragia vaginal o elimina cogulos. Notifique al profesional acerca de la cantidad y de cuntos apsitos est utilizando. Unas pequeas manchas de sangre son algo comn durante el Psychiatrist, especialmente despus de Sales promotion account executive.  Presenta un olor desagradable en la secrecin vaginal y observa un cambio en el color, de transparente a blanco.  Contina con las nuseas y no obtiene alivio de los remedios indicados. Vomita sangre o algo similar a la borra del caf.  [place] o sube ms de 900 g. en una semana, o segn lo indicado por el profesional que la asiste.  Observa que se le Southwest [employer], las manos, los pies o las piernas.  Ha estado expuesta a la rubola y no ha sufrido la enfermedad.  Ha estado expuesta a la quinta enfermedad o a la varicela.  Presenta dolor abdominal. Las molestias en el ligamento redondo son Neomia Dear causa no cancerosa (benigna) frecuente de dolor abdominal durante el embarazo. El profesional que la asiste deber evaluarla.  Presenta dolor de cabeza intenso que no se [place].  Presenta fiebre, diarrea, dolor al orinar o le falta la respiracin.  Presenta dificultad para ver, visin borrosa, o visin doble.  Sufre una cada, un accidente de trnsito o cualquier tipo de  trauma.  Vive en un hogar en el que existe violencia fsica o mental. Document Released: 04/29/2005 Document Revised: 10/12/2011 [facility] Patient Information 2013 [place], [place].  Lactancia materna  (Breastfeeding) [place]id[place] Museum/gallery exhibitions officer es una de las mejores elecciones que puede hacer por usted y su beb. La informacin que se brinda a Psychologist, clinical dar una breve visin de los beneficios de la lactancia materna as como de las dudas ms frecuentes alrededor de ella.  LOS BENEFICIOS DE AMAMANTAR  Para el beb   La  primera leche (calostro ) ayuda al mejor funcionamiento del sistema digestivo del beb.   La leche tiene anticuerpos que provienen de la madre y que ayudan a prevenir las infecciones en el beb.   El beb tiene una menor incidencia de asma, alergias y del sndrome de muerte sbita del lactante (SMSL).   Los nutrientes en la [place] materna son mejores para el beb que los preparados para lactantes y la [place] materna ayuda a un mejor desarrollo del cerebro del beb.   Los bebs amamantados tienen menos gases, clicos y estreimiento.  Para la mam   La lactancia materna favorece el desarrollo de un vnculo muy especial entre la madre y el beb.   Es ms conveniente, siempre disponible y a Landscape architect y Film/video editor.   Consume caloras en la madre y la ayuda a perder el peso ganado durante el [place].   Favorece la contraccin del tero a su tamao normal, de manera ms rpida y [employer] las hemorragias luego del [place].   Las [employer] que amamantan tienen menor riesgo de Geophysical data processor de mama.  FRECUENCIA DEL AMAMANTAMIENTO   Un beb sano, nacido a trmino, puede amamantarse con tanta frecuencia como cada hora, o espaciar las comidas cada tres horas.   Observe al beb cuando manifieste signos de hambre. Amamante a su beb si muestra signos de hambre. Esta frecuencia variar de un beb a otro.   Amamntelo tan seguido como el beb lo solicite, o  cuando usted sienta la necesidad de Paramedic sus [place].   Despierte al beb si han pasado 3  4 horas desde la ltima comida.   El amamantamiento frecuente la ayudar a producir ms [place] y a Education officer, community de Engineer, mining en los pezones e hinchazn de las [place].  POSICIN DEL BEBE PARA EL AMAMANTAMIENTO   Ya sea que se encuentre [place] o sentada, asegrese que el abdomen del beb enfrente el suyo.   Sostenga la mama con el pulgar por arriba y los otros 4 dedos por debajo. Asegrese que sus dedos se encuentren lejos del pezn y de la boca del beb.   Empuje suavemente los labios del beb con el pezn o con el dedo.   Cuando la boca del beb se abra lo suficiente, introduzca el pezn y la areola tanto como le sea posible dentro de la boca.   Coloque al beb cerca suyo de modo que su nariz y mejillas toquen las mamas al [employer].  ALIMENTACIN Y SUCCIN   La duracin de cada comida vara de un beb a otro y de Neomia Dear comida a [place].   El beb debe succionar entre 2 y 3 minutos para que Development worker, international aid. Esto se denomina "bajada". Por este motivo, permita que el nio se alimente en cada mama todo lo que desee. Terminar de mamar cuando haya recibido la cantidad [place] de nutrientes.   Para detener la succin coloque su dedo en la comisura de la boca del nio y Midwife entre sus encas antes de quitarle la mama de la boca. Esto la ayudar a English as a second language teacher.  COMO SABER SI EL BEB OBTIENE LA SUFICIENTE LECHE MATERNA  Preguntarse si el beb obtiene la cantidad suficiente de [place] es una preocupacin frecuente [employer]. Puede asegurarse que el beb tiene la leche suficiente si:   El beb succiona activamente y usted escucha que traga .   El beb parece estar relajado y satisfecho despus de Psychologist, clinical.   El nio se [address]  al menos 8 a 12 veces en 24 horas. Alimntelo hasta que se desprenda por sus propios medios o se quede dormido en la primera mama (al menos durante  10 a 20 minutos), luego ofrzcale el otro lado.   El beb moja 5 a 6 paales desechables (6 a 8 paales de tela) en 24 horas cuando tiene 5  6 das de vida.   Tiene al Lowe[employer] Companies 3 a 4 deposiciones todos los [employer] and Company primeros meses. La materia fecal debe ser blanda y [place].   El beb debe aumentar 4 a 6 libras (120 a 170 gr.) por semana despus de los 4 [address] de vida.   Siente que las mamas se ablandan despus de amamantar  REDUCIR LA CONGESTIN DE LAS MAMAS   Durante la primera semana despus del [place], usted puede experimentar hinchazn en las mamas. Cuando las mamas estn congestionadas, se sienten calientes, llenas y molestas al tacto. Puede reducir la congestin si:   Lo amamanta frecuentemente, cada 2-3 horas. Las mams que [employer] pronto y con frecuencia tienen menos problemas de [place].   Coloque compresas de hielo en sus mamas durante 10-20 minutos entre cada amamantamiento. Esto ayuda a Building services engineer. Envuelva las bolsas de hielo en una toalla liviana para proteger su piel. Las bolsas de vegetales congelados funcionan bien para este propsito.   Tome una ducha tibia o aplique compresas hmedas calientes en las mamas durante 5 a 10 minutos antes de cada vez que [place]. Esto aumenta la circulacin y [place] a que la [place].   Masajee suavemente la mama antes y Psychologist, sport and exercise. Con las puntas de los dedos, masajee desde la pared torcica hacia abajo hasta llegar al pezn, con movimientos circulares.   Asegrese que el nio vaca al menos una mama antes de cambiar de lado.   Use un sacaleche para vaciar la mama si el beb se duerme o no se alimenta bien. Tambin podr [name] con esa bomba si tiene que volver al trabajo o siente que las mamas estn congestionadas.   Evite los biberones, chupetes o complementar la alimentacin con agua o jugos en lugar de la [place]. La leche materna es todo el alimento que el beb necesita. No  es necesario que el nio ingiera agua o preparados de bibern. Louann Liv, es lo mejor para ayudar a que las mamas produzcan ms [place]. no darle suplementos al [employer] las primeras semanas.   Verifique que el beb se encuentra en la posicin correcta mientras lo alimenta.   Use un sostn que soporte bien sus mamas y [employer] que tienen aro.   Consuma una dieta balanceada y beba lquidos en cantidad.   Descanse con frecuencia, reljese y tome sus vitaminas prenatales para evitar la fatiga, el estrs y la anemia.  Si sigue estas indicaciones, la congestin debe mejorar en 24 a 48 horas. Si an tiene dificultades, consulte a Barista.  CUDESE USTED MISMA  Cuide sus mamas.   Bese o dchese diariamente.   Evite usar [employer].   Comience a amamantar del lado izquierdo en una comida y del lado derecho en la siguiente.   Notar que aumenta el flujo de [place] a los 2 a 5 [address]  [address] despus del [address]. Puede sentir algunas molestias por la congestin, lo que hace que sus mamas estn duras y sensibles. La congestin disminuye en 24 a 48 horas. Mientras tanto, aplique toallas hmedas calientes durante 5 a 10 minutos  antes de amamantar. Un masaje suave y la extraccin de un poco de leche antes de Museum/gallery exhibitions officer ablandarn las mamas y har ms fcil que el beb se agarre.   Use un buen sostn y seque al aire los pezones durante 3 a 4 minutos luego de Wellsite geologist.   Solo utilice apsitos de algodn.   Utilice lanolina [employer] pezones luego de [place]. No necesita lavarlos luego de alimentar al [employer]. Otra opcin es exprimir algunas gotas de [place] y [employer] pezones.  Cumpla con estos cuidados   Consuma alimentos bien balanceados y refrigerios nutritivos.   Dixie Dials, jugos de fruta y agua para Warehouse manager sed (alrededor de 8 vasos por Futures trader).   Descanse lo suficiente.  Evite los alimentos que usted note que pueden afectar al beb.   SOLICITE ATENCIN MDICA SI:   Tiene dificultad con la lactancia materna y [place].   Tiene una zona de color rojo, dura y dolorosa en la mama que se acompaa de B and E.   El beb est muy somnoliento como para alimentarse bien o tiene problemas para dormir.   Su beb moja menos de 6 paales al [address], a los 5 [address]  [address] de vida.   La piel del beb o la parte blanca de sus ojos est ms amarilla de lo que estaba en el hospital.   Se siente deprimida.  Document Released: 07/20/2005 Document Revised: 01/19/2012 [facility] Patient Information 2013 [place], [place].

## 2012-05-05 NOTE — Progress Notes (Signed)
No s/sx's of PTL--U/s last visit shows nml anatomy and cx was 3.93 cm C/o of white d/c and vaginal irritation--rx with Diflucan For flu shot today.

## 2012-05-12 ENCOUNTER — Ambulatory Visit (INDEPENDENT_AMBULATORY_CARE_PROVIDER_SITE_OTHER): Payer: Self-pay | Admitting: General Practice

## 2012-05-12 VITALS — BP 110/72 | HR 74 | Temp 96.7°F | Ht <= 58 in | Wt 174.5 lb

## 2012-05-12 DIAGNOSIS — O09219 Supervision of pregnancy with history of pre-term labor, unspecified trimester: Secondary | ICD-10-CM

## 2012-05-12 DIAGNOSIS — B373 Candidiasis of vulva and vagina: Secondary | ICD-10-CM

## 2012-05-12 MED ORDER — TERCONAZOLE 0.4 % VA CREA: 1.0000 | TOPICAL_CREAM | Freq: Every day | VAGINAL | Status: DC

## 2012-05-12 MED ORDER — HYDROXYPROGESTERONE CAPROATE 250 MG/ML IM OIL
250.0000 mg | TOPICAL_OIL | Freq: Once | INTRAMUSCULAR | Status: AC
  Administered 2012-05-12: 250 mg via INTRAMUSCULAR

## 2012-05-17 ENCOUNTER — Inpatient Hospital Stay (HOSPITAL_COMMUNITY): Payer: Self-pay

## 2012-05-17 ENCOUNTER — Inpatient Hospital Stay (HOSPITAL_COMMUNITY)
Admission: AD | Admit: 2012-05-17 | Discharge: 2012-05-17 | Disposition: A | Discharge status: Home / Self Care | Payer: Self-pay | Source: Ambulatory Visit | Attending: Obstetrics & Gynecology | Admitting: Obstetrics & Gynecology

## 2012-05-17 ENCOUNTER — Encounter (HOSPITAL_COMMUNITY): Payer: Self-pay

## 2012-05-17 DIAGNOSIS — R109 Unspecified abdominal pain: Secondary | ICD-10-CM | POA: Insufficient documentation

## 2012-05-17 DIAGNOSIS — M549 Dorsalgia, unspecified: Secondary | ICD-10-CM

## 2012-05-17 DIAGNOSIS — M545 Low back pain, unspecified: Secondary | ICD-10-CM | POA: Insufficient documentation

## 2012-05-17 DIAGNOSIS — O99891 Other specified diseases and conditions complicating pregnancy: Secondary | ICD-10-CM | POA: Insufficient documentation

## 2012-05-17 DIAGNOSIS — N949 Unspecified condition associated with female genital organs and menstrual cycle: Secondary | ICD-10-CM | POA: Insufficient documentation

## 2012-05-17 DIAGNOSIS — O3430 Maternal care for cervical incompetence, unspecified trimester: Secondary | ICD-10-CM

## 2012-05-17 LAB — URINALYSIS, ROUTINE W REFLEX MICROSCOPIC
Bilirubin Urine: NEGATIVE
Ketones, ur: NEGATIVE mg/dL
Leukocytes, UA: NEGATIVE
Nitrite: NEGATIVE
Protein, ur: NEGATIVE mg/dL
Urobilinogen, UA: 0.2 mg/dL (ref 0.0–1.0)
pH: 6 (ref 5.0–8.0)

## 2012-05-17 MED ORDER — OXYCODONE-ACETAMINOPHEN 5-325 MG PO TABS: 1.0000 | ORAL_TABLET | Freq: Four times a day (QID) | ORAL | Status: DC | PRN

## 2012-05-17 NOTE — MAU Provider Note (Signed)
Chief Complaint:  Tailbone Pain, Pelvic Pain and Extremity Weakness   First Provider Initiated Contact with Patient 05/17/12 1821     HPI: [place] is a 31 y.o. [phone] at [id number] presents to maternity admissions reporting low back pain since yesterday morning and low abd pressure w/ pain radiating down bilat thighs since yesterday afternoon. Pain resolved spontaneously in MAU.  Denies fever, chills, urinary complaints, GI complaints (some constipation, but normal BM today), leakage of fluid, vaginal discharge or vaginal bleeding. Good fetal movement. Has Hx two previable births secondary to incompetent cervix. Cerclage in place. Pressure similar to previous losses. Last CL 3.93 cm on 04/28/12.   Pt states that low back pain has been present since near beginning of pregnancy and before but getting worse. Lying in bed and sitting make it worse. If lies on side, gets burning pain in one or other buttock. Tylenol, heat, ice don't really help but standing up and moving around do seem to help some.  Past Medical History: Past Medical History  Diagnosis Date  . Anxiety     Past obstetric history: OB History    Grav Para Term Preterm Abortions TAB SAB Ect Mult Living   6 4 2 2 1  0 1 0 0 2     # Outc Date GA Lbr Len/2nd Wgt Sex Del Anes PTL Lv   1 TRM 10/01 [redacted]w[redacted]d  8lb11oz(3.941kg) M CS Spinal No Yes   2 TRM 8/08 [redacted]w[redacted]d  6lb11oz(3.033kg) F CS Spinal No Yes   Comments: At 21 weeks   3 SAB            4 PRE            5 PRE            6 CUR               Past Surgical History: Past Surgical History  Procedure Date  . Cesarean section   . Cervical cerclage 04/01/2012    Procedure: CERCLAGE CERVICAL;  Surgeon: Reva Bores, MD;  Location: [facility];  Service: Gynecology;  Laterality: N/A;    Family History: Family History  Problem Relation Age of Onset  . Other Neg Hx   . Hypertension Mother   . Hypertension Father     Social History: History  Substance Use Topics  .  Smoking status: Never Smoker   . Smokeless tobacco: Never Used  . Alcohol Use: No    Allergies: No Known Allergies  Meds:  Prescriptions prior to admission  Medication Sig Dispense Refill  . fluconazole (DIFLUCAN) 150 MG tablet Take 1 tablet (150 mg total) by mouth daily. Repeat in 24 hours if needed  2 tablet  2  . hydroxyprogesterone caproate (DELALUTIN) 250 mg/mL OIL Inject 1 mL (250 mg total) into the muscle once a week. Start [redacted] weeks GA until 36 6/[redacted] weeks GA  1 vial  20  . Prenatal Multivit-Min-Fe-FA (PRENATAL VITAMINS) 0.8 MG tablet Take 1 tablet by mouth daily.  30 tablet  9  . terconazole (TERAZOL 7) 0.4 % vaginal cream Place 1 applicator vaginally at bedtime.  45 g  0    ROS: Pertinent findings in history of present illness.  Physical Exam  Blood pressure 119/73, pulse 93, temperature 98.2 F (36.8 C), temperature source Oral, resp. rate 20, height 4\' 10"  (1.473 m), weight 79.742 kg (175 lb 12.8 oz), last menstrual period 12/24/2011. GENERAL: Well-developed, well-nourished female in no acute distress. Nervious.  HEENT: normocephalic HEART:  normal rate RESP: normal effort ABDOMEN: Soft, non-tender, gravid appropriate for gestational age EXTREMITIES: Nontender, no edema NEURO: alert and oriented SPECULUM EXAM: NEFG, physiologic discharge, no blood, cervix clean. Cerclage in place w/out tension noted, no membranes seen. Cervix FT/50/-2, anterior, soft.   FHT: 141 per doppler Contractions: none   Labs: Results for orders placed during the hospital encounter of 05/17/12 (from the past 24 hour(s))  URINALYSIS, ROUTINE W REFLEX MICROSCOPIC     Status: Abnormal   Collection Time   05/17/12  5:45 PM      Component Value Range   Color, Urine STRAW (*) YELLOW   APPearance CLEAR  CLEAR   Specific Gravity, Urine <1.005 (*) 1.005 - 1.030   pH 6.0  5.0 - 8.0   Glucose, UA NEGATIVE  NEGATIVE mg/dL   Hgb urine dipstick SMALL (*) NEGATIVE   Bilirubin Urine NEGATIVE  NEGATIVE    Ketones, ur NEGATIVE  NEGATIVE mg/dL   Protein, ur NEGATIVE  NEGATIVE mg/dL   Urobilinogen, UA 0.2  0.0 - 1.0 mg/dL   Nitrite NEGATIVE  NEGATIVE   Leukocytes, UA NEGATIVE  NEGATIVE  URINE MICROSCOPIC-ADD ON     Status: Normal   Collection Time   05/17/12  5:45 PM      Component Value Range   Squamous Epithelial / LPF RARE  RARE   RBC / HPF 0-2  <3 RBC/hpf   Imaging:  US Ob Comp + 14 Wk  04/28/2012  OBSTETRICAL ULTRASOUND: This exam was performed within a [facility]. The OB US report was generated in the [facility] system, and faxed to the ordering physician.   This report is also available in [employer] and in the [employer]. See [facility] Obstetric US report.   US Ob Transvaginal  05/17/2012  *RADIOLOGY REPORT*  Clinical Data: 31 year old pregnant female with 20-week-5-day gestation - pelvic and back pain.  Patient with cerclage.  Evaluate cervical length.  LIMITED OBSTETRIC ULTRASOUND  Number of Fetuses: 1 Heart Rate: 144 bpm Movement: Present Presentation: Transverse head maternal left Placental Location: Fundal Previa: No Amniotic Fluid (Subjective): Normal  Vertical pocket:  5.3cm  MATERNAL FINDINGS: Cervix: 2.7 cm - closed.  Cerclage is identified. Uterus/Adnexae: No abnormalities identified.  IMPRESSION: Single living intrauterine gestation with assigned gestational age of [redacted] weeks 5 days.  Closed cervix measuring 2.7 cm. Cerclage identified.  Recommend followup with non-emergent complete OB 14+ wk US examination for fetal biometric evaluation and anatomic survey if not already performed.   Original Report Authenticated By: Rosendo Gros, M.D.    ED Course Care of patient turned over to Truitt Merle, MD at 2000. Pt in [place].   [place], CNM 05/17/2012 9:31 PM   Assessment: No diagnosis found.  Plan: Discharge home Preterm labor precautions    Medication List     As of 05/17/2012  9:28 PM    ASK your doctor about these medications          fluconazole 150 MG tablet   Commonly known as: DIFLUCAN   Take 1 tablet (150 mg total) by mouth daily. Repeat in 24 hours if needed      hydroxyprogesterone caproate 250 mg/mL Oil   Commonly known as: DELALUTIN   Inject 1 mL (250 mg total) into the muscle once a week. Start [redacted] weeks GA until 36 6/[redacted] weeks GA      PRENATAL VITAMINS 0.8 MG tablet   Take 1 tablet by mouth daily.  terconazole 0.4 % vaginal cream   Commonly known as: TERAZOL 7   Place 1 applicator vaginally at bedtime.        Care assumed at 8:00 PM.   Pt back from ultrasound. Pain is resolved. Discussed results of sono and explained that there is no intervention at this time for shortened cervix but that we will follow cervical length by ultrasound over next weeks.  Pt not sure what contractions feel like - has never had them because of prior c/sections.  A/P: 1.  Back pain - likely disk/nerve related - sciatica.  Will try percocet for severe pain. Back exercises. 2.  Cervical incompetence with recent shortening. Cerclage in place and intact. Expectant management until viability. Follow up sonos for cervical length. 3.  Labor precautions, pelvic rest. Home with follow-up on Thursday in clinic.  Napoleon Form, MD

## 2012-05-17 NOTE — MAU Note (Signed)
Since yesterday my buttocks feels like on fire -my doctor said it was siatica. Pelvic pain. Legs are weak. Lost 2 babies and 20wks and don't want to lose another

## 2012-05-19 ENCOUNTER — Ambulatory Visit (INDEPENDENT_AMBULATORY_CARE_PROVIDER_SITE_OTHER): Payer: Self-pay | Admitting: Advanced Practice Midwife

## 2012-05-19 VITALS — BP 115/85 | Temp 98.9°F | Wt 174.1 lb

## 2012-05-19 DIAGNOSIS — O09219 Supervision of pregnancy with history of pre-term labor, unspecified trimester: Secondary | ICD-10-CM

## 2012-05-19 DIAGNOSIS — N883 Incompetence of cervix uteri: Secondary | ICD-10-CM

## 2012-05-19 LAB — POCT URINALYSIS DIP (DEVICE)
Bilirubin Urine: NEGATIVE
Ketones, ur: NEGATIVE mg/dL
pH: 7 (ref 5.0–8.0)

## 2012-05-19 MED ORDER — HYDROXYPROGESTERONE CAPROATE 250 MG/ML IM OIL
250.0000 mg | TOPICAL_OIL | Freq: Once | INTRAMUSCULAR | Status: AC
  Administered 2012-05-19: 250 mg via INTRAMUSCULAR

## 2012-05-19 NOTE — Progress Notes (Signed)
Doing well.  Feeling fetal movement, denies vaginal bleeding, LOF, regular contractions.  Does feel some intermittent pelvic pressure. Was seen in MAU 10/15 for back pain.  Some cervical shortening on U/S.  Cervix 0/long today, cerclage in place.  U/S in 2 weeks for cervical length.

## 2012-05-19 NOTE — Progress Notes (Signed)
Pulse- 87 

## 2012-05-19 NOTE — Progress Notes (Signed)
U/S scheduled 06/02/12 at 8 am.

## 2012-05-26 ENCOUNTER — Ambulatory Visit (INDEPENDENT_AMBULATORY_CARE_PROVIDER_SITE_OTHER): Payer: Self-pay

## 2012-05-26 VITALS — BP 113/77 | HR 85 | Ht 61.0 in | Wt 175.6 lb

## 2012-05-26 DIAGNOSIS — O09219 Supervision of pregnancy with history of pre-term labor, unspecified trimester: Secondary | ICD-10-CM

## 2012-05-26 MED ORDER — HYDROXYPROGESTERONE CAPROATE 250 MG/ML IM OIL
250.0000 mg | TOPICAL_OIL | Freq: Once | INTRAMUSCULAR | Status: AC
  Administered 2012-05-26: 250 mg via INTRAMUSCULAR

## 2012-05-26 NOTE — Progress Notes (Signed)
Pt c/o of continuing vaginal discharge with itching.  According to last visit Dr. Shawnie Pons advised pt to take once then if symptoms persist to take another dose.  Pt stated per Raynelle Fanning, [place] interpreter, she only took the one dose.  I advised pt that she should take one more dose and if symptoms persist then she can see provider at next [facility] visit scheduled on 06/02/12.  Pt stated understanding and stated that she would go to her pharmacy to pick up her refill.

## 2012-06-02 ENCOUNTER — Ambulatory Visit (HOSPITAL_COMMUNITY)
Admission: RE | Admit: 2012-06-02 | Discharge: 2012-06-02 | Disposition: A | Discharge status: Home / Self Care | Payer: Self-pay | Source: Ambulatory Visit | Attending: Advanced Practice Midwife | Admitting: Advanced Practice Midwife

## 2012-06-02 ENCOUNTER — Other Ambulatory Visit: Payer: Self-pay | Admitting: Advanced Practice Midwife

## 2012-06-02 ENCOUNTER — Ambulatory Visit (INDEPENDENT_AMBULATORY_CARE_PROVIDER_SITE_OTHER): Payer: Self-pay | Admitting: Obstetrics & Gynecology

## 2012-06-02 VITALS — BP 115/76 | Temp 97.9°F | Wt 178.5 lb

## 2012-06-02 DIAGNOSIS — O09299 Supervision of pregnancy with other poor reproductive or obstetric history, unspecified trimester: Secondary | ICD-10-CM | POA: Insufficient documentation

## 2012-06-02 DIAGNOSIS — O3430 Maternal care for cervical incompetence, unspecified trimester: Secondary | ICD-10-CM

## 2012-06-02 DIAGNOSIS — O0990 Supervision of high risk pregnancy, unspecified, unspecified trimester: Secondary | ICD-10-CM

## 2012-06-02 DIAGNOSIS — O34219 Maternal care for unspecified type scar from previous cesarean delivery: Secondary | ICD-10-CM

## 2012-06-02 DIAGNOSIS — O26879 Cervical shortening, unspecified trimester: Secondary | ICD-10-CM | POA: Insufficient documentation

## 2012-06-02 DIAGNOSIS — N883 Incompetence of cervix uteri: Secondary | ICD-10-CM

## 2012-06-02 DIAGNOSIS — E669 Obesity, unspecified: Secondary | ICD-10-CM | POA: Insufficient documentation

## 2012-06-02 DIAGNOSIS — O09899 Supervision of other high risk pregnancies, unspecified trimester: Secondary | ICD-10-CM

## 2012-06-02 LAB — POCT URINALYSIS DIP (DEVICE)
Ketones, ur: NEGATIVE mg/dL
Protein, ur: NEGATIVE mg/dL
Specific Gravity, Urine: 1.02 (ref 1.005–1.030)
pH: 5.5 (ref 5.0–8.0)

## 2012-06-02 MED ORDER — HYDROXYPROGESTERONE CAPROATE 250 MG/ML IM OIL
250.0000 mg | TOPICAL_OIL | Freq: Once | INTRAMUSCULAR | Status: AC
  Administered 2012-06-02: 250 mg via INTRAMUSCULAR

## 2012-06-02 NOTE — Progress Notes (Signed)
Cervical length is 2.7 cm with no funneling.  Will need cervical check in 1 week and TV US in 2 weeks.

## 2012-06-09 ENCOUNTER — Ambulatory Visit (INDEPENDENT_AMBULATORY_CARE_PROVIDER_SITE_OTHER): Payer: Self-pay | Admitting: Obstetrics and Gynecology

## 2012-06-09 ENCOUNTER — Encounter: Payer: Self-pay | Admitting: Obstetrics and Gynecology

## 2012-06-09 VITALS — BP 121/81 | Temp 97.8°F | Wt 180.6 lb

## 2012-06-09 VITALS — BP 121/77 | HR 99 | Temp 97.8°F | Ht <= 58 in | Wt 180.6 lb

## 2012-06-09 DIAGNOSIS — O34219 Maternal care for unspecified type scar from previous cesarean delivery: Secondary | ICD-10-CM

## 2012-06-09 DIAGNOSIS — O3430 Maternal care for cervical incompetence, unspecified trimester: Secondary | ICD-10-CM

## 2012-06-09 DIAGNOSIS — O09899 Supervision of other high risk pregnancies, unspecified trimester: Secondary | ICD-10-CM

## 2012-06-09 MED ORDER — HYDROXYPROGESTERONE CAPROATE 250 MG/ML IM OIL
250.0000 mg | TOPICAL_OIL | Freq: Once | INTRAMUSCULAR | Status: AC
  Administered 2012-06-09: 250 mg via INTRAMUSCULAR

## 2012-06-09 NOTE — Progress Notes (Signed)
Patient ID: Teresa Day, female   DOB: January 25, 1981, 31 y.o.   MRN: [id number] Patient without any complaints today. Cervix is closed and short, palpable cerclage with knot at 12 o'clock. No tension on suture. Will schedule follow-up cervical length as previously planned. FM/PTL precautions reviewed. Modified bedrest instructions also reviewed.

## 2012-06-09 NOTE — Progress Notes (Signed)
Pulse 86 Patient reports a lot of pelvic pressure

## 2012-06-09 NOTE — Addendum Note (Signed)
Addended by: Catalina Antigua on: 06/09/2012 11:50 AM   Modules accepted: Orders

## 2012-06-16 ENCOUNTER — Ambulatory Visit (INDEPENDENT_AMBULATORY_CARE_PROVIDER_SITE_OTHER): Payer: Self-pay | Admitting: *Deleted

## 2012-06-16 ENCOUNTER — Ambulatory Visit (HOSPITAL_COMMUNITY)
Admission: RE | Admit: 2012-06-16 | Discharge: 2012-06-16 | Disposition: A | Discharge status: Home / Self Care | Payer: Self-pay | Source: Ambulatory Visit | Attending: Obstetrics and Gynecology | Admitting: Obstetrics and Gynecology

## 2012-06-16 VITALS — BP 109/72 | HR 92 | Wt 181.2 lb

## 2012-06-16 DIAGNOSIS — E669 Obesity, unspecified: Secondary | ICD-10-CM | POA: Insufficient documentation

## 2012-06-16 DIAGNOSIS — O99210 Obesity complicating pregnancy, unspecified trimester: Secondary | ICD-10-CM | POA: Insufficient documentation

## 2012-06-16 DIAGNOSIS — O26879 Cervical shortening, unspecified trimester: Secondary | ICD-10-CM | POA: Insufficient documentation

## 2012-06-16 DIAGNOSIS — O09899 Supervision of other high risk pregnancies, unspecified trimester: Secondary | ICD-10-CM

## 2012-06-16 DIAGNOSIS — O09219 Supervision of pregnancy with history of pre-term labor, unspecified trimester: Secondary | ICD-10-CM

## 2012-06-16 DIAGNOSIS — O3430 Maternal care for cervical incompetence, unspecified trimester: Secondary | ICD-10-CM

## 2012-06-16 MED ORDER — HYDROXYPROGESTERONE CAPROATE 250 MG/ML IM OIL
250.0000 mg | TOPICAL_OIL | Freq: Once | INTRAMUSCULAR | Status: AC
  Administered 2012-06-16: 250 mg via INTRAMUSCULAR

## 2012-06-23 ENCOUNTER — Encounter: Payer: Self-pay | Admitting: Obstetrics and Gynecology

## 2012-06-23 ENCOUNTER — Ambulatory Visit (INDEPENDENT_AMBULATORY_CARE_PROVIDER_SITE_OTHER): Payer: Self-pay | Admitting: Obstetrics and Gynecology

## 2012-06-23 VITALS — BP 111/75 | Temp 97.3°F | Wt 180.0 lb

## 2012-06-23 DIAGNOSIS — O34219 Maternal care for unspecified type scar from previous cesarean delivery: Secondary | ICD-10-CM

## 2012-06-23 DIAGNOSIS — O3430 Maternal care for cervical incompetence, unspecified trimester: Secondary | ICD-10-CM

## 2012-06-23 DIAGNOSIS — Z23 Encounter for immunization: Secondary | ICD-10-CM

## 2012-06-23 DIAGNOSIS — O09899 Supervision of other high risk pregnancies, unspecified trimester: Secondary | ICD-10-CM

## 2012-06-23 LAB — CBC
HCT: 35.2 % — ABNORMAL LOW (ref 36.0–46.0)
MCHC: 34.7 g/dL (ref 30.0–36.0)
MCV: 85.9 fL (ref 78.0–100.0)
RDW: 12.6 % (ref 11.5–15.5)

## 2012-06-23 LAB — POCT URINALYSIS DIP (DEVICE)
Protein, ur: NEGATIVE mg/dL
Specific Gravity, Urine: 1.02 (ref 1.005–1.030)
Urobilinogen, UA: 0.2 mg/dL (ref 0.0–1.0)

## 2012-06-23 MED ORDER — HYDROXYPROGESTERONE CAPROATE 250 MG/ML IM OIL
250.0000 mg | TOPICAL_OIL | Freq: Once | INTRAMUSCULAR | Status: AC
  Administered 2012-06-23: 250 mg via INTRAMUSCULAR

## 2012-06-23 MED ORDER — TETANUS-DIPHTH-ACELL PERTUSSIS 5-2.5-18.5 LF-MCG/0.5 IM SUSP
0.5000 mL | Freq: Once | INTRAMUSCULAR | Status: AC
  Administered 2012-06-23: 0.5 mL via INTRAMUSCULAR

## 2012-06-23 NOTE — Addendum Note (Signed)
Addended by: Franchot Mimes on: 06/23/2012 10:21 AM   Modules accepted: Orders

## 2012-06-23 NOTE — Progress Notes (Signed)
Patient doing well without complaints. Continue weekly 17- p. Cervical length ultrasound results reviewed.

## 2012-06-23 NOTE — Progress Notes (Signed)
P=93, c/o vaginal pain that comes and goes, states Tuesday had watery discharge. Used Engineer, maintenance (IT).

## 2012-06-27 ENCOUNTER — Encounter: Payer: Self-pay | Admitting: Obstetrics and Gynecology

## 2012-06-29 ENCOUNTER — Ambulatory Visit (INDEPENDENT_AMBULATORY_CARE_PROVIDER_SITE_OTHER): Payer: Self-pay | Admitting: General Practice

## 2012-06-29 VITALS — BP 105/71 | HR 85 | Temp 96.8°F | Ht <= 58 in | Wt 182.4 lb

## 2012-06-29 DIAGNOSIS — O3430 Maternal care for cervical incompetence, unspecified trimester: Secondary | ICD-10-CM

## 2012-06-29 DIAGNOSIS — N898 Other specified noninflammatory disorders of vagina: Secondary | ICD-10-CM

## 2012-06-29 MED ORDER — HYDROXYPROGESTERONE CAPROATE 250 MG/ML IM OIL
250.0000 mg | TOPICAL_OIL | Freq: Once | INTRAMUSCULAR | Status: AC
  Administered 2012-06-29: 250 mg via INTRAMUSCULAR

## 2012-06-29 MED ORDER — FLUCONAZOLE 150 MG PO TABS: 150.0000 mg | ORAL_TABLET | Freq: Once | ORAL | Status: DC

## 2012-06-29 NOTE — Progress Notes (Signed)
Patient reports a mucous white discharge with itching which is similar to what she has had in the past, per OB protocol will send Rx for diflucan

## 2012-07-07 ENCOUNTER — Ambulatory Visit (INDEPENDENT_AMBULATORY_CARE_PROVIDER_SITE_OTHER): Payer: Self-pay | Admitting: Obstetrics & Gynecology

## 2012-07-07 VITALS — BP 113/72 | Temp 97.3°F | Wt 183.5 lb

## 2012-07-07 DIAGNOSIS — O3430 Maternal care for cervical incompetence, unspecified trimester: Secondary | ICD-10-CM

## 2012-07-07 DIAGNOSIS — Z23 Encounter for immunization: Secondary | ICD-10-CM

## 2012-07-07 DIAGNOSIS — O09219 Supervision of pregnancy with history of pre-term labor, unspecified trimester: Secondary | ICD-10-CM

## 2012-07-07 LAB — POCT URINALYSIS DIP (DEVICE)
Glucose, UA: NEGATIVE mg/dL
Ketones, ur: NEGATIVE mg/dL
Protein, ur: NEGATIVE mg/dL
Specific Gravity, Urine: 1.02 (ref 1.005–1.030)
Urobilinogen, UA: 0.2 mg/dL (ref 0.0–1.0)

## 2012-07-07 MED ORDER — HYDROXYPROGESTERONE CAPROATE 250 MG/ML IM OIL
250.0000 mg | TOPICAL_OIL | Freq: Once | INTRAMUSCULAR | Status: AC
  Administered 2012-07-07: 250 mg via INTRAMUSCULAR

## 2012-07-07 NOTE — Progress Notes (Signed)
No problems.  Passed GCT (131).  Cervix closed and long.

## 2012-07-07 NOTE — Patient Instructions (Signed)
Eleccin del mtodo anticonceptivo  (Contraception Choices) La anticoncepcin (control de la natalidad) es el uso de cualquier mtodo o dispositivo para Location manager. A continuacin se indican algunos de esos mtodos.  MTODOS HORMONALES   Implante anticonceptivo. Es un tubo plstico delgado que contiene la hormona progesterona. No contiene estrgenos. El mdico inserta el tubo en la parte interna del brazo. El tubo puede Geneticist, molecular durante 3 aos. Despus de los 3 aos debe retirarse. El implante impide que los ovarios liberen vulos (ovulacin), espesa el moco cervical, lo que evita que los espermatozoides ingresen al tero y hace ms delgada la membrana que cubre el interior del tero.  Inyecciones de progesterona sola. Estas inyecciones se administran cada 3 meses para evitar el embarazo. La progesterona sinttica impide que los ovarios liberen vulos. Tambin hace que el moco cervical se espese y modifica el recubrimiento interno del tero. Esto hace ms difcil que los espermatozoides sobrevivan en el tero.  Pldoras anticonceptivas. Las pldoras anticonceptivas contienen estrgenos y Education officer, museum. Actan impidiendo que el vulo se forme en el ovario(ovulacin). Las pldoras anticonceptivas son recetadas por el mdico.Tambin se utilizan para tratar los perodos menstruales abundantes.  Minipldora. Este tipo de pldora anticonceptiva contiene slo hormona progesterona. Deben tomarse todos los [address]  [address] del mes y debe recetarlas el mdico.  Parches anticonceptivos. El parche contiene hormonas similares a las que contienen las pldoras anticonceptivas. Deben cambiarse una vez por semana y se utilizan bajo prescripcin mdica.  Anillo vaginal. Anillo vaginal contiene hormonas similares a las que contienen las pldoras anticonceptivas. Se deja colocado durante tres semanas, se lo retira durante 1 semana y luego se coloca uno nuevo. La paciente debe sentirse cmoda para insertar y  retirar el anillo de la vagina.Es necesaria la receta del mdico.  Anticonceptivos de Associate Professor. Los anticonceptivos de emergencia son mtodos para evitar un embarazo despus de una relacin sexual sin proteccin. Esta pldora puede tomarse inmediatamente despus de Child psychotherapist sexuales o hasta 5 [place] de haber tenido sexo sin proteccin. Es ms efectiva si se toma poco tiempo despus. Los anticonceptivos de emergencia estn disponibles sin prescripcin mdica. Consltelo con su farmacutico. No use los anticonceptivos de emergencia como nico mtodo anticonceptivo. MTODOS DE BARRERA   Condn masculino. Es una vaina delgada (ltex o goma) que se [place] en el pene durante el acto sexual. Deri Fuelling con espermicida para aumentar la efectividad.  Condn femenino. Es una vaina blanda y floja que se adapta suavemente a la vagina antes de las relaciones sexuales.  Diafragma. Es una barrera de ltex redonda y Casimer Bilis que debe ser ajustada por un profesional. Se inserta en la vagina, junto con un gel espermicida. Debe insertarse antes de Management consultant. Debe dejar el diafragma colocado en la vagina durante 6 a 8 horas despus de la relacin sexual.  Capuchn cervical. Es una taza de ltex o plstico, redonda y [place] que cubre el cuello del tero y debe ser ajustada por un mdico. Puede dejarlo colocado en la vagina hasta 48 horas despus de las Clinical research associate.  Esponja. Es una pieza blanda y circular de espuma de poliuretano. Contiene un espermicida. Se inserta en la vagina despus de mojarla y antes de las [employer].  Espermicidas. Los espermicidas son qumicos que matan o bloquean el esperma y no lo dejan ingresar al cuello del tero y al tero. Vienen en forma de cremas, geles, supositorios, espuma o comprimidos. No es necesario tener Emergency planning/management officer. Se insertan en la vagina con un aplicador  antes de [employer]. El proceso debe repetirse cada vez que tiene  relaciones sexuales. ANTICONCEPTIVOS INTRAUTERINOS   Dispositivo intrauterino (DIU). Es un dispositivo en forma de T que se coloca en el tero durante el perodo menstrual, para Location manager. Hay dos tipos:  DIU de cobre. Este tipo de DIU est recubierto con un alambre de cobre y se inserta dentro del tero. El cobre hace que el tero y las trompas de Falopio produzcan un liquido que [employer] espermatozoides. Puede permanecer colocado durante 10 aos.  DIU hormonal. Este tipo de DIU contiene la hormona progestina (progesterona sinttica). La hormona espesa el moco cervical y evita que los espermatozoides ingresen al tero y tambin afina la membrana que cubre el tero para evitar la implantacin del vulo fertilizado. La hormona debilita o destruye los espermatozoides que ingresan al tero. Puede permanecer colocado durante 5 aos. MTODOS ANTICONCEPTIVOS PERMANENTES   Ligadura de trompas en la mujer. La ligadura de trompas en la mujer se realiza sellando, atando u obstruyendo quirrgicamente las trompas de Falopio lo que impide que el vulo descienda hacia el tero.  Esterilizacin masculina. Se realiza atando los conductos por los que pasan los espermatozoides (vasectoma).Esto impide que el esperma ingrese a la vagina durante el acto sexual. Luego del procedimiento, el hombre puede eyacular lquido (semen). MTODOS DE PLANIFICACIN NATURAL   Planificacin familiar natural.  Consiste en no tener relaciones sexuales o usar un mtodo de barrera (condn, Joslin, capuchn cervical) en los [employer] la mujer podra quedar [place].  Mtodo calendario.  Consiste en el seguimiento de la duracin de cada ciclo menstrual y la identificacin de los perodos frtiles.  Mtodo de Occupational hygienist.  Consiste en evitar las relaciones sexuales durante la ovulacin.  Mtodo sintotrmico. Paramedic las relaciones sexuales en la poca en la que se est ovulando, utilizando un termmetro y  tendiendo en cuenta los sntomas de la ovulacin.  Mtodo post-ovulacin. Consiste en planificar las relaciones sexuales para despus de haber ovulado. Independientemente del tipo o mtodo anticonceptivo que usted elija, es importante que use condones para protegerse contra las enfermedades de transmisin sexual (ETS). Hable con su mdico con respecto a qu mtodo anticonceptivo es el ms apropiado para usted.  Document Released: 07/20/2005 Document Revised: 10/12/2011 [facility] Patient Information 2013 [place], [place].

## 2012-07-07 NOTE — Progress Notes (Signed)
P = 89 Groin pain

## 2012-07-14 ENCOUNTER — Ambulatory Visit (INDEPENDENT_AMBULATORY_CARE_PROVIDER_SITE_OTHER): Payer: Self-pay

## 2012-07-14 VITALS — BP 138/82 | HR 93 | Wt 185.3 lb

## 2012-07-14 DIAGNOSIS — O09219 Supervision of pregnancy with history of pre-term labor, unspecified trimester: Secondary | ICD-10-CM

## 2012-07-14 MED ORDER — HYDROXYPROGESTERONE CAPROATE 250 MG/ML IM OIL
250.0000 mg | TOPICAL_OIL | Freq: Once | INTRAMUSCULAR | Status: AC
  Administered 2012-07-14: 250 mg via INTRAMUSCULAR

## 2012-07-21 ENCOUNTER — Ambulatory Visit (INDEPENDENT_AMBULATORY_CARE_PROVIDER_SITE_OTHER): Payer: Self-pay | Admitting: Family

## 2012-07-21 VITALS — BP 124/77 | Temp 97.0°F | Wt 188.6 lb

## 2012-07-21 DIAGNOSIS — O3430 Maternal care for cervical incompetence, unspecified trimester: Secondary | ICD-10-CM

## 2012-07-21 LAB — POCT URINALYSIS DIP (DEVICE)
Bilirubin Urine: NEGATIVE
Nitrite: NEGATIVE
Specific Gravity, Urine: 1.025 (ref 1.005–1.030)
pH: 6 (ref 5.0–8.0)

## 2012-07-21 MED ORDER — HYDROXYPROGESTERONE CAPROATE 250 MG/ML IM OIL
250.0000 mg | TOPICAL_OIL | Freq: Once | INTRAMUSCULAR | Status: AC
  Administered 2012-07-21: 250 mg via INTRAMUSCULAR

## 2012-07-21 NOTE — Progress Notes (Signed)
Pulse- 101 

## 2012-07-21 NOTE — Progress Notes (Signed)
Reports pain in groin with movement; no bleeding or leaking of fluid; 17-p today; preterm labor precautions; urine dip results not available at discharge.

## 2012-07-21 NOTE — Addendum Note (Signed)
Addended by: Jill Side on: 07/21/2012 12:41 PM   Modules accepted: Orders

## 2012-07-28 ENCOUNTER — Ambulatory Visit (INDEPENDENT_AMBULATORY_CARE_PROVIDER_SITE_OTHER): Payer: Self-pay

## 2012-07-28 VITALS — BP 109/72 | HR 78 | Wt 192.0 lb

## 2012-07-28 DIAGNOSIS — O09219 Supervision of pregnancy with history of pre-term labor, unspecified trimester: Secondary | ICD-10-CM

## 2012-07-28 MED ORDER — HYDROXYPROGESTERONE CAPROATE 250 MG/ML IM OIL
250.0000 mg | TOPICAL_OIL | Freq: Once | INTRAMUSCULAR | Status: AC
  Administered 2012-07-28: 250 mg via INTRAMUSCULAR

## 2012-08-04 ENCOUNTER — Ambulatory Visit (INDEPENDENT_AMBULATORY_CARE_PROVIDER_SITE_OTHER): Payer: Self-pay | Admitting: Obstetrics & Gynecology

## 2012-08-04 VITALS — BP 132/83 | Temp 96.6°F | Wt 194.5 lb

## 2012-08-04 DIAGNOSIS — O09899 Supervision of other high risk pregnancies, unspecified trimester: Secondary | ICD-10-CM

## 2012-08-04 DIAGNOSIS — O3430 Maternal care for cervical incompetence, unspecified trimester: Secondary | ICD-10-CM

## 2012-08-04 DIAGNOSIS — O34219 Maternal care for unspecified type scar from previous cesarean delivery: Secondary | ICD-10-CM

## 2012-08-04 LAB — POCT URINALYSIS DIP (DEVICE)
Ketones, ur: NEGATIVE mg/dL
Protein, ur: NEGATIVE mg/dL
pH: 6 (ref 5.0–8.0)

## 2012-08-04 MED ORDER — HYDROXYPROGESTERONE CAPROATE 250 MG/ML IM OIL
250.0000 mg | TOPICAL_OIL | Freq: Once | INTRAMUSCULAR | Status: AC
  Administered 2012-08-04: 250 mg via INTRAMUSCULAR

## 2012-08-04 NOTE — Patient Instructions (Signed)
Return to clinic for any obstetric concerns or go to MAU for evaluation  

## 2012-08-04 NOTE — Progress Notes (Signed)
Pulse- 93  Edema-hands,feet

## 2012-08-04 NOTE — Progress Notes (Signed)
Continue weekly 17P.  Order sent in for scheduling her RLTCS.  No other complaints or concerns.  Fetal movement and labor precautions reviewed.

## 2012-08-09 ENCOUNTER — Other Ambulatory Visit: Payer: Self-pay | Admitting: Obstetrics & Gynecology

## 2012-08-11 ENCOUNTER — Ambulatory Visit (INDEPENDENT_AMBULATORY_CARE_PROVIDER_SITE_OTHER): Payer: Self-pay | Admitting: General Practice

## 2012-08-11 VITALS — BP 115/78 | HR 89 | Temp 97.0°F | Ht <= 58 in | Wt 193.5 lb

## 2012-08-11 DIAGNOSIS — O3430 Maternal care for cervical incompetence, unspecified trimester: Secondary | ICD-10-CM

## 2012-08-11 MED ORDER — HYDROXYPROGESTERONE CAPROATE 250 MG/ML IM OIL
250.0000 mg | TOPICAL_OIL | Freq: Once | INTRAMUSCULAR | Status: AC
  Administered 2012-08-11: 250 mg via INTRAMUSCULAR

## 2012-08-18 ENCOUNTER — Ambulatory Visit (INDEPENDENT_AMBULATORY_CARE_PROVIDER_SITE_OTHER): Payer: Self-pay | Admitting: Family

## 2012-08-18 VITALS — BP 102/69 | Temp 96.6°F | Wt 195.2 lb

## 2012-08-18 DIAGNOSIS — O09219 Supervision of pregnancy with history of pre-term labor, unspecified trimester: Secondary | ICD-10-CM

## 2012-08-18 DIAGNOSIS — O34219 Maternal care for unspecified type scar from previous cesarean delivery: Secondary | ICD-10-CM

## 2012-08-18 DIAGNOSIS — O3430 Maternal care for cervical incompetence, unspecified trimester: Secondary | ICD-10-CM

## 2012-08-18 LAB — POCT URINALYSIS DIP (DEVICE)
Bilirubin Urine: NEGATIVE
Glucose, UA: NEGATIVE mg/dL
Nitrite: NEGATIVE
pH: 6 (ref 5.0–8.0)

## 2012-08-18 MED ORDER — HYDROXYPROGESTERONE CAPROATE 250 MG/ML IM OIL
250.0000 mg | TOPICAL_OIL | Freq: Once | INTRAMUSCULAR | Status: AC
  Administered 2012-08-18: 250 mg via INTRAMUSCULAR

## 2012-08-18 NOTE — Progress Notes (Signed)
Reports lower back pain in the night; no bleeding or leaking of fluid; 17p today; cervical exam - stitch visualized, no bleeding noted, stitch palpated in place, cervix thick, visually closed.  Preterm labor precautions reviewed.

## 2012-08-18 NOTE — Progress Notes (Signed)
Pt complains of back pain.  p=88

## 2012-08-25 ENCOUNTER — Ambulatory Visit (INDEPENDENT_AMBULATORY_CARE_PROVIDER_SITE_OTHER): Payer: Self-pay | Admitting: Obstetrics & Gynecology

## 2012-08-25 VITALS — BP 113/76 | Temp 97.4°F | Wt 199.2 lb

## 2012-08-25 DIAGNOSIS — O3430 Maternal care for cervical incompetence, unspecified trimester: Secondary | ICD-10-CM

## 2012-08-25 DIAGNOSIS — O09219 Supervision of pregnancy with history of pre-term labor, unspecified trimester: Secondary | ICD-10-CM

## 2012-08-25 DIAGNOSIS — O368190 Decreased fetal movements, unspecified trimester, not applicable or unspecified: Secondary | ICD-10-CM

## 2012-08-25 DIAGNOSIS — O34219 Maternal care for unspecified type scar from previous cesarean delivery: Secondary | ICD-10-CM

## 2012-08-25 LAB — POCT URINALYSIS DIP (DEVICE)
Ketones, ur: NEGATIVE mg/dL
Leukocytes, UA: NEGATIVE
Nitrite: NEGATIVE
Protein, ur: NEGATIVE mg/dL

## 2012-08-25 MED ORDER — HYDROXYPROGESTERONE CAPROATE 250 MG/ML IM OIL
250.0000 mg | TOPICAL_OIL | Freq: Once | INTRAMUSCULAR | Status: AC
  Administered 2012-08-25: 250 mg via INTRAMUSCULAR

## 2012-08-25 NOTE — Patient Instructions (Signed)
Mtodo para contar los movimientos fetales (Fetal Movement Counts) Nombre de la paciente: __________________________________________________ Fecha probable de parto:____________________ En los embarazos de alto riesgo se recomienda contar las pataditas, pero tambin es una buena idea que lo hagan todas las embarazadas. Comience a contarlas a las 28 semanas de embarazo. Los movimientos fetales aumentan luego de una comida completa o de comer o beber algo dulce (el nivel de azcar en la sangre est ms alto). Tambin es importante beber gran cantidad de lquidos (hidratarse bien) antes de contar. Si se recuesta sobre el lado izquierdo mejorar la circulacin, o puede sentarse en una silla cmoda con los brazos sobre el abdomen y sin distracciones que la rodeen. CONTANDO  Trate de contar a la misma hora todos los das que lo haga.  Marque el da y la hora y vea cunto le lleva sentir 10 movimientos (patadas, agitaciones, sacudones, vueltas). Debe sentir al menos 10 movimientos en 2 horas. Probablemente sienta los 10 movimientos en menos de dos horas. Si no los siente, espere una hora y cuente nuevamente. Luego de algunos das tendr un patrn.  Debemos observar si hay cambios en el patrn o no hay suficientes pataditas en 2 horas. Le lleva ms tiempo contar los 10 movimientos? SOLICITE ATENCIN MDICA SI:  Siente menos de 10 pataditas en 2 horas. Intntelo dos veces.  No siente movimientos durante 1 hora.  El patrn se modifica o le lleva ms tiempo cada da contar las 10 pataditas.  Siente que el beb no se mueve como lo hace habitualmente. Fecha: ____________ Movimientos: ____________ Comienzo hora: ____________ Fin hora: ____________ Fecha: ____________ Movimientos: ____________ Comienzo hora: ____________ Fin hora: ____________ Fecha: ____________ Movimientos: ____________ Comienzo hora: ____________ Fin hora: ____________ Fecha: ____________ Movimientos: ____________ Comienzo hora:  ____________ Fin hora: ____________ Fecha: ____________ Movimientos: ____________ Comienzo hora: ____________ Fin hora: ____________ Fecha: ____________ Movimientos: ____________ Comienzo hora: ____________ Fin hora: ____________ Fecha: ____________ Movimientos: ____________ Comienzo hora: ____________ Fin hora: ____________  Fecha: ____________ Movimientos: ____________ Comienzo hora: ____________ Fin hora: ____________ Fecha: ____________ Movimientos: ____________ Comienzo hora: ____________ Fin hora: ____________ Fecha: ____________ Movimientos: ____________ Comienzo hora: ____________ Fin hora: ____________ Fecha: ____________ Movimientos: ____________ Comienzo hora: ____________ Fin hora: ____________ Fecha: ____________ Movimientos: ____________ Comienzo hora: ____________ Fin hora: ____________ Fecha: ____________ Movimientos: ____________ Comienzo hora: ____________ Fin hora: ____________ Fecha: ____________ Movimientos: ____________ Comienzo hora: ____________ Fin hora: ____________  Fecha: ____________ Movimientos: ____________ Comienzo hora: ____________ Fin hora: ____________ Fecha: ____________ Movimientos: ____________ Comienzo hora: ____________ Fin hora: ____________ Fecha: ____________ Movimientos: ____________ Comienzo hora: ____________ Fin hora: ____________ Fecha: ____________ Movimientos: ____________ Comienzo hora: ____________ Fin hora: ____________ Fecha: ____________ Movimientos: ____________ Comienzo hora: ____________ Fin hora: ____________ Fecha: ____________ Movimientos: ____________ Comienzo hora: ____________ Fin hora: ____________ Fecha: ____________ Movimientos: ____________ Comienzo hora: ____________ Fin hora: ____________  Fecha: ____________ Movimientos: ____________ Comienzo hora: ____________ Fin hora: ____________ Fecha: ____________ Movimientos: ____________ Comienzo hora: ____________ Fin hora: ____________ Fecha: ____________ Movimientos: ____________  Comienzo hora: ____________ Fin hora: ____________ Fecha: ____________ Movimientos: ____________ Comienzo hora: ____________ Fin hora: ____________ Fecha: ____________ Movimientos: ____________ Comienzo hora: ____________ Fin hora: ____________ Fecha: ____________ Movimientos: ____________ Comienzo hora: ____________ Fin hora: ____________ Fecha: ____________ Movimientos: ____________ Comienzo hora: ____________ Fin hora: ____________  Fecha: ____________ Movimientos: ____________ Comienzo hora: ____________ Fin hora: ____________ Fecha: ____________ Movimientos: ____________ Comienzo hora: ____________ Fin hora: ____________ Fecha: ____________ Movimientos: ____________ Comienzo hora: ____________ Fin hora: ____________ Fecha: ____________ Movimientos: ____________ Comienzo hora: ____________ Fin hora: ____________ Fecha: ____________ Movimientos: ____________ Comienzo hora: ____________ Fin hora: ____________   Fecha: ____________ Movimientos: ____________ Comienzo hora: ____________ Fin hora: ____________ Fecha: ____________ Movimientos: ____________ Comienzo hora: ____________ Fin hora: ____________  Fecha: ____________ Movimientos: ____________ Comienzo hora: ____________ Fin hora: ____________ Fecha: ____________ Movimientos: ____________ Comienzo hora: ____________ Fin hora: ____________ Fecha: ____________ Movimientos: ____________ Comienzo hora: ____________ Fin hora: ____________ Fecha: ____________ Movimientos: ____________ Comienzo hora: ____________ Fin hora: ____________ Fecha: ____________ Movimientos: ____________ Comienzo hora: ____________ Fin hora: ____________ Fecha: ____________ Movimientos: ____________ Comienzo hora: ____________ Fin hora: ____________ Fecha: ____________ Movimientos: ____________ Comienzo hora: ____________ Fin hora: ____________  Fecha: ____________ Movimientos: ____________ Comienzo hora: ____________ Fin hora: ____________ Fecha: ____________ Movimientos:  ____________ Comienzo hora: ____________ Fin hora: ____________ Fecha: ____________ Movimientos: ____________ Comienzo hora: ____________ Fin hora: ____________ Fecha: ____________ Movimientos: ____________ Comienzo hora: ____________ Fin hora: ____________ Fecha: ____________ Movimientos: ____________ Comienzo hora: ____________ Fin hora: ____________ Fecha: ____________ Movimientos: ____________ Comienzo hora: ____________ Fin hora: ____________ Fecha: ____________ Movimientos: ____________ Comienzo hora: ____________ Fin hora: ____________  Fecha: ____________ Movimientos: ____________ Comienzo hora: ____________ Fin hora: ____________ Fecha: ____________ Movimientos: ____________ Comienzo hora: ____________ Fin hora: ____________ Fecha: ____________ Movimientos: ____________ Comienzo hora: ____________ Fin hora: ____________ Fecha: ____________ Movimientos: ____________ Comienzo hora: ____________ Fin hora: ____________ Fecha: ____________ Movimientos: ____________ Comienzo hora: ____________ Fin hora: ____________ Fecha: ____________ Movimientos: ____________ Comienzo hora: ____________ Fin hora: ____________ Fecha: ____________ Movimientos: ____________ Comienzo hora: ____________ Fin hora: ____________  Document Released: 10/27/2007 Document Revised: 10/12/2011 [employer] Patient Information 2013 [employer].  

## 2012-08-25 NOTE — Progress Notes (Signed)
Pulse-  95 

## 2012-08-25 NOTE — Progress Notes (Signed)
Continue 17-P until 36 weeks.  C/o decreased fetal mvmt.  NST ordered.  Pt has had 2 failed attempts at vaginal delivery (including a failed VBAC).  Not good candidate for VBAC.  Discussed with pt ans she agrees.

## 2012-08-25 NOTE — Progress Notes (Signed)
Pt felt good FM during NST- results reported to Dr. Penne Lash and strip was reviewed.  Pt instructed to return to hospital if decr. FM occurs. Interpreter- Delorise Royals

## 2012-09-01 ENCOUNTER — Ambulatory Visit (INDEPENDENT_AMBULATORY_CARE_PROVIDER_SITE_OTHER): Payer: Self-pay | Admitting: General Practice

## 2012-09-01 VITALS — BP 106/81 | HR 91 | Temp 98.0°F | Ht 59.0 in | Wt 200.5 lb

## 2012-09-01 DIAGNOSIS — O09219 Supervision of pregnancy with history of pre-term labor, unspecified trimester: Secondary | ICD-10-CM

## 2012-09-01 MED ORDER — HYDROXYPROGESTERONE CAPROATE 250 MG/ML IM OIL
250.0000 mg | TOPICAL_OIL | Freq: Once | INTRAMUSCULAR | Status: AC
  Administered 2012-09-01: 250 mg via INTRAMUSCULAR

## 2012-09-08 ENCOUNTER — Ambulatory Visit (INDEPENDENT_AMBULATORY_CARE_PROVIDER_SITE_OTHER): Payer: Self-pay | Admitting: Family

## 2012-09-08 VITALS — BP 123/87 | Temp 96.9°F | Wt 202.5 lb

## 2012-09-08 DIAGNOSIS — O3430 Maternal care for cervical incompetence, unspecified trimester: Secondary | ICD-10-CM

## 2012-09-08 DIAGNOSIS — O34219 Maternal care for unspecified type scar from previous cesarean delivery: Secondary | ICD-10-CM

## 2012-09-08 DIAGNOSIS — O09219 Supervision of pregnancy with history of pre-term labor, unspecified trimester: Secondary | ICD-10-CM

## 2012-09-08 DIAGNOSIS — O09899 Supervision of other high risk pregnancies, unspecified trimester: Secondary | ICD-10-CM

## 2012-09-08 LAB — POCT URINALYSIS DIP (DEVICE)
Bilirubin Urine: NEGATIVE
Ketones, ur: NEGATIVE mg/dL
Protein, ur: NEGATIVE mg/dL
Specific Gravity, Urine: 1.01 (ref 1.005–1.030)
pH: 6.5 (ref 5.0–8.0)

## 2012-09-08 NOTE — Progress Notes (Signed)
NST reactive on 08/25/12

## 2012-09-08 NOTE — Addendum Note (Signed)
Addended by: Soyla Murphy T on: 09/08/2012 12:54 PM   Modules accepted: Orders

## 2012-09-08 NOTE — Progress Notes (Signed)
Pulse: 85

## 2012-09-08 NOTE — Progress Notes (Signed)
No questions or concerns; cerclage digitally felt in place; repeat csection scheduled for 2/20 0900; cerclage will be removed at that time;  Gbs, GC/CT collected.

## 2012-09-14 ENCOUNTER — Encounter: Payer: Self-pay | Admitting: Family

## 2012-09-20 ENCOUNTER — Encounter (HOSPITAL_COMMUNITY): Payer: Self-pay

## 2012-09-20 ENCOUNTER — Encounter (HOSPITAL_COMMUNITY)
Admission: RE | Admit: 2012-09-20 | Discharge: 2012-09-20 | Disposition: A | Discharge status: Home / Self Care | Payer: Medicaid Other | Source: Ambulatory Visit | Attending: Obstetrics & Gynecology | Admitting: Obstetrics & Gynecology

## 2012-09-20 VITALS — BP 124/80 | HR 75 | Resp 20 | Ht 59.0 in | Wt 205.0 lb

## 2012-09-20 DIAGNOSIS — O3430 Maternal care for cervical incompetence, unspecified trimester: Secondary | ICD-10-CM

## 2012-09-20 DIAGNOSIS — O34219 Maternal care for unspecified type scar from previous cesarean delivery: Secondary | ICD-10-CM

## 2012-09-20 DIAGNOSIS — O09899 Supervision of other high risk pregnancies, unspecified trimester: Secondary | ICD-10-CM

## 2012-09-20 LAB — CBC
HCT: 37 % (ref 36.0–46.0)
Hemoglobin: 13.1 g/dL (ref 12.0–15.0)
MCV: 82.2 fL (ref 78.0–100.0)
RDW: 14.2 % (ref 11.5–15.5)
WBC: 8.8 10*3/uL (ref 4.0–10.5)

## 2012-09-20 NOTE — Pre-Procedure Instructions (Signed)
[employer] [phone] used for PAT appointment. Eda Royal assisted with interpretation of consents

## 2012-09-20 NOTE — Patient Instructions (Addendum)
   Your procedure is scheduled [id number] February 20th  Enter through the Main Entrance of [facility] at:8am Pick up the phone at the desk and dial [phone] and inform us of your arrival.  Please call this number if you have any problems the morning of surgery: [phone]  Remember: Do not eat or drink anything after midnight Wednesday   Do not wear jewelry, make-up, or FINGER nail polish No metal in your hair or on your body. Do not wear lotions, powders, perfumes. You may wear deodorant.  Please use your CHG wash as directed prior to surgery.  Do not shave anywhere for at least 12 hours prior to first CHG shower.  Do not bring valuables to the hospital.   Leave suitcase in the car. After Surgery it may be brought to your room. For patients being admitted to the hospital, checkout time is 11:00am the day of discharge.

## 2012-09-22 ENCOUNTER — Encounter (HOSPITAL_COMMUNITY): Payer: Self-pay | Admitting: General Surgery

## 2012-09-22 ENCOUNTER — Inpatient Hospital Stay (HOSPITAL_COMMUNITY): Payer: Medicaid Other | Admitting: Anesthesiology

## 2012-09-22 ENCOUNTER — Encounter (HOSPITAL_COMMUNITY)
Admission: RE | Disposition: A | Discharge status: Home / Self Care | Payer: Self-pay | Source: Ambulatory Visit | Attending: Obstetrics & Gynecology

## 2012-09-22 ENCOUNTER — Encounter (HOSPITAL_COMMUNITY): Payer: Self-pay | Admitting: Anesthesiology

## 2012-09-22 ENCOUNTER — Inpatient Hospital Stay (HOSPITAL_COMMUNITY)
Admission: RE | Admit: 2012-09-22 | Discharge: 2012-09-24 | DRG: 765 | Disposition: A | Discharge status: Home / Self Care | Payer: Medicaid Other | Source: Ambulatory Visit | Attending: Obstetrics & Gynecology | Admitting: Obstetrics & Gynecology

## 2012-09-22 DIAGNOSIS — O3430 Maternal care for cervical incompetence, unspecified trimester: Secondary | ICD-10-CM | POA: Diagnosis present

## 2012-09-22 DIAGNOSIS — Z01812 Encounter for preprocedural laboratory examination: Secondary | ICD-10-CM

## 2012-09-22 DIAGNOSIS — O34219 Maternal care for unspecified type scar from previous cesarean delivery: Principal | ICD-10-CM | POA: Diagnosis present

## 2012-09-22 DIAGNOSIS — O09899 Supervision of other high risk pregnancies, unspecified trimester: Secondary | ICD-10-CM

## 2012-09-22 HISTORY — PX: CERVICAL CERCLAGE: SHX1329

## 2012-09-22 LAB — TYPE AND SCREEN
ABO/RH(D): B POS
Antibody Screen: NEGATIVE

## 2012-09-22 SURGERY — Surgical Case
Anesthesia: Spinal | Site: Cervix | Wound class: Clean Contaminated

## 2012-09-22 MED ORDER — SODIUM CHLORIDE 0.9 % IJ SOLN: 3.0000 mL | INTRAMUSCULAR | Status: DC | PRN

## 2012-09-22 MED ORDER — PROMETHAZINE HCL 25 MG/ML IJ SOLN: 6.2500 mg | INTRAMUSCULAR | Status: DC | PRN

## 2012-09-22 MED ORDER — OXYTOCIN 40 UNITS IN LACTATED RINGERS INFUSION - SIMPLE MED: 62.5000 mL/h | INTRAVENOUS | Status: AC

## 2012-09-22 MED ORDER — SIMETHICONE 80 MG PO CHEW: 80.0000 mg | CHEWABLE_TABLET | ORAL | Status: DC | PRN

## 2012-09-22 MED ORDER — FENTANYL CITRATE 0.05 MG/ML IJ SOLN
INTRAMUSCULAR | Status: DC | PRN
  Administered 2012-09-22: 15 ug via INTRATHECAL

## 2012-09-22 MED ORDER — NALBUPHINE HCL 10 MG/ML IJ SOLN
5.0000 mg | INTRAMUSCULAR | Status: DC | PRN
  Filled 2012-09-22: qty 1

## 2012-09-22 MED ORDER — LANOLIN HYDROUS EX OINT: 1.0000 "application " | TOPICAL_OINTMENT | CUTANEOUS | Status: DC | PRN

## 2012-09-22 MED ORDER — BUPIVACAINE IN DEXTROSE 0.75-8.25 % IT SOLN
INTRATHECAL | Status: DC | PRN
  Administered 2012-09-22: 1.2 mL via INTRATHECAL

## 2012-09-22 MED ORDER — ONDANSETRON HCL 4 MG/2ML IJ SOLN: 4.0000 mg | Freq: Three times a day (TID) | INTRAMUSCULAR | Status: DC | PRN

## 2012-09-22 MED ORDER — LACTATED RINGERS IV SOLN
INTRAVENOUS | Status: DC
  Administered 2012-09-22 (×4): via INTRAVENOUS

## 2012-09-22 MED ORDER — DIBUCAINE 1 % RE OINT: 1.0000 "application " | TOPICAL_OINTMENT | RECTAL | Status: DC | PRN

## 2012-09-22 MED ORDER — FENTANYL CITRATE 0.05 MG/ML IJ SOLN
INTRAMUSCULAR | Status: AC
  Filled 2012-09-22: qty 2

## 2012-09-22 MED ORDER — ONDANSETRON HCL 4 MG PO TABS: 4.0000 mg | ORAL_TABLET | ORAL | Status: DC | PRN

## 2012-09-22 MED ORDER — DIPHENHYDRAMINE HCL 25 MG PO CAPS: 25.0000 mg | ORAL_CAPSULE | ORAL | Status: DC | PRN

## 2012-09-22 MED ORDER — OXYCODONE-ACETAMINOPHEN 5-325 MG PO TABS
1.0000 | ORAL_TABLET | ORAL | Status: DC | PRN
  Administered 2012-09-24: 2 via ORAL
  Filled 2012-09-22: qty 2

## 2012-09-22 MED ORDER — KETOROLAC TROMETHAMINE 30 MG/ML IJ SOLN
INTRAMUSCULAR | Status: AC
  Administered 2012-09-22: 30 mg via INTRAVENOUS
  Filled 2012-09-22: qty 1

## 2012-09-22 MED ORDER — MIDAZOLAM HCL 2 MG/2ML IJ SOLN: 0.5000 mg | Freq: Once | INTRAMUSCULAR | Status: DC | PRN

## 2012-09-22 MED ORDER — NALOXONE HCL 0.4 MG/ML IJ SOLN: 0.4000 mg | INTRAMUSCULAR | Status: DC | PRN

## 2012-09-22 MED ORDER — EPHEDRINE 5 MG/ML INJ
INTRAVENOUS | Status: AC
  Filled 2012-09-22: qty 10

## 2012-09-22 MED ORDER — TETANUS-DIPHTH-ACELL PERTUSSIS 5-2.5-18.5 LF-MCG/0.5 IM SUSP: 0.5000 mL | Freq: Once | INTRAMUSCULAR | Status: DC

## 2012-09-22 MED ORDER — ONDANSETRON HCL 4 MG/2ML IJ SOLN
INTRAMUSCULAR | Status: AC
  Filled 2012-09-22: qty 2

## 2012-09-22 MED ORDER — DIPHENHYDRAMINE HCL 50 MG/ML IJ SOLN: 25.0000 mg | INTRAMUSCULAR | Status: DC | PRN

## 2012-09-22 MED ORDER — IBUPROFEN 600 MG PO TABS
600.0000 mg | ORAL_TABLET | Freq: Four times a day (QID) | ORAL | Status: DC
  Administered 2012-09-22 – 2012-09-24 (×8): 600 mg via ORAL
  Filled 2012-09-22 (×8): qty 1

## 2012-09-22 MED ORDER — OXYTOCIN 10 UNIT/ML IJ SOLN
40.0000 [IU] | INTRAVENOUS | Status: DC | PRN
  Administered 2012-09-22: 40 [IU] via INTRAVENOUS

## 2012-09-22 MED ORDER — SIMETHICONE 80 MG PO CHEW
80.0000 mg | CHEWABLE_TABLET | Freq: Three times a day (TID) | ORAL | Status: DC
  Administered 2012-09-22 – 2012-09-24 (×6): 80 mg via ORAL

## 2012-09-22 MED ORDER — KETOROLAC TROMETHAMINE 30 MG/ML IJ SOLN
30.0000 mg | Freq: Four times a day (QID) | INTRAMUSCULAR | Status: AC | PRN
  Administered 2012-09-22: 30 mg via INTRAVENOUS

## 2012-09-22 MED ORDER — SCOPOLAMINE 1 MG/3DAYS TD PT72
1.0000 | MEDICATED_PATCH | Freq: Once | TRANSDERMAL | Status: DC
  Administered 2012-09-22: 1.5 mg via TRANSDERMAL

## 2012-09-22 MED ORDER — METOCLOPRAMIDE HCL 5 MG/ML IJ SOLN: 10.0000 mg | Freq: Three times a day (TID) | INTRAMUSCULAR | Status: DC | PRN

## 2012-09-22 MED ORDER — FLEET ENEMA 7-19 GM/118ML RE ENEM: 1.0000 | ENEMA | Freq: Every day | RECTAL | Status: DC | PRN

## 2012-09-22 MED ORDER — MORPHINE SULFATE (PF) 0.5 MG/ML IJ SOLN
INTRAMUSCULAR | Status: DC | PRN
  Administered 2012-09-22: .1 mg via INTRATHECAL

## 2012-09-22 MED ORDER — PRENATAL MULTIVITAMIN CH
1.0000 | ORAL_TABLET | Freq: Every day | ORAL | Status: DC
  Administered 2012-09-23 – 2012-09-24 (×2): 1 via ORAL
  Filled 2012-09-22 (×2): qty 1

## 2012-09-22 MED ORDER — NALOXONE HCL 1 MG/ML IJ SOLN
1.0000 ug/kg/h | INTRAVENOUS | Status: DC | PRN
  Filled 2012-09-22: qty 2

## 2012-09-22 MED ORDER — ONDANSETRON HCL 4 MG/2ML IJ SOLN
INTRAMUSCULAR | Status: DC | PRN
  Administered 2012-09-22: 4 mg via INTRAVENOUS

## 2012-09-22 MED ORDER — ACETAMINOPHEN 10 MG/ML IV SOLN
INTRAVENOUS | Status: AC
  Administered 2012-09-22: 1000 mg via INTRAVENOUS
  Filled 2012-09-22: qty 100

## 2012-09-22 MED ORDER — ONDANSETRON HCL 4 MG/2ML IJ SOLN: 4.0000 mg | INTRAMUSCULAR | Status: DC | PRN

## 2012-09-22 MED ORDER — MEASLES, MUMPS & RUBELLA VAC ~~LOC~~ INJ: 0.5000 mL | INJECTION | Freq: Once | SUBCUTANEOUS | Status: DC

## 2012-09-22 MED ORDER — NALBUPHINE HCL 10 MG/ML IJ SOLN
5.0000 mg | INTRAMUSCULAR | Status: DC | PRN
  Administered 2012-09-22: 10 mg via SUBCUTANEOUS
  Filled 2012-09-22: qty 1

## 2012-09-22 MED ORDER — NALBUPHINE SYRINGE 5 MG/0.5 ML
INJECTION | INTRAMUSCULAR | Status: AC
  Administered 2012-09-22: 10 mg via SUBCUTANEOUS
  Filled 2012-09-22: qty 1

## 2012-09-22 MED ORDER — MEPERIDINE HCL 25 MG/ML IJ SOLN: 6.2500 mg | INTRAMUSCULAR | Status: DC | PRN

## 2012-09-22 MED ORDER — CEFAZOLIN SODIUM-DEXTROSE 2-3 GM-% IV SOLR
2.0000 g | INTRAVENOUS | Status: AC
  Administered 2012-09-22: 2 g via INTRAVENOUS

## 2012-09-22 MED ORDER — BISACODYL 10 MG RE SUPP: 10.0000 mg | Freq: Every day | RECTAL | Status: DC | PRN

## 2012-09-22 MED ORDER — SENNOSIDES-DOCUSATE SODIUM 8.6-50 MG PO TABS
2.0000 | ORAL_TABLET | Freq: Every day | ORAL | Status: DC
  Administered 2012-09-22 – 2012-09-23 (×2): 2 via ORAL

## 2012-09-22 MED ORDER — LACTATED RINGERS IV SOLN
INTRAVENOUS | Status: DC
  Administered 2012-09-22: 21:00:00 via INTRAVENOUS

## 2012-09-22 MED ORDER — OXYTOCIN 10 UNIT/ML IJ SOLN
INTRAMUSCULAR | Status: AC
  Filled 2012-09-22: qty 4

## 2012-09-22 MED ORDER — SCOPOLAMINE 1 MG/3DAYS TD PT72
1.0000 | MEDICATED_PATCH | Freq: Once | TRANSDERMAL | Status: DC
  Filled 2012-09-22: qty 1

## 2012-09-22 MED ORDER — WITCH HAZEL-GLYCERIN EX PADS: 1.0000 "application " | MEDICATED_PAD | CUTANEOUS | Status: DC | PRN

## 2012-09-22 MED ORDER — ACETAMINOPHEN 10 MG/ML IV SOLN
1000.0000 mg | Freq: Four times a day (QID) | INTRAVENOUS | Status: AC | PRN
  Administered 2012-09-22: 1000 mg via INTRAVENOUS
  Filled 2012-09-22: qty 100

## 2012-09-22 MED ORDER — EPHEDRINE SULFATE 50 MG/ML IJ SOLN
INTRAMUSCULAR | Status: DC | PRN
  Administered 2012-09-22 (×10): 5 mg via INTRAVENOUS

## 2012-09-22 MED ORDER — DIPHENHYDRAMINE HCL 25 MG PO CAPS: 25.0000 mg | ORAL_CAPSULE | Freq: Four times a day (QID) | ORAL | Status: DC | PRN

## 2012-09-22 MED ORDER — KETOROLAC TROMETHAMINE 30 MG/ML IJ SOLN: 30.0000 mg | Freq: Four times a day (QID) | INTRAMUSCULAR | Status: AC | PRN

## 2012-09-22 MED ORDER — FENTANYL CITRATE 0.05 MG/ML IJ SOLN: 25.0000 ug | INTRAMUSCULAR | Status: DC | PRN

## 2012-09-22 MED ORDER — LACTATED RINGERS IV SOLN
INTRAVENOUS | Status: DC
  Administered 2012-09-22 (×2): via INTRAVENOUS

## 2012-09-22 MED ORDER — CEFAZOLIN SODIUM-DEXTROSE 2-3 GM-% IV SOLR
INTRAVENOUS | Status: AC
  Filled 2012-09-22: qty 50

## 2012-09-22 MED ORDER — FERROUS SULFATE 325 (65 FE) MG PO TABS
325.0000 mg | ORAL_TABLET | Freq: Two times a day (BID) | ORAL | Status: DC
  Administered 2012-09-23 (×2): 325 mg via ORAL
  Filled 2012-09-22 (×2): qty 1

## 2012-09-22 MED ORDER — SCOPOLAMINE 1 MG/3DAYS TD PT72
MEDICATED_PATCH | TRANSDERMAL | Status: AC
  Administered 2012-09-22: 1.5 mg via TRANSDERMAL
  Filled 2012-09-22: qty 1

## 2012-09-22 MED ORDER — MENTHOL 3 MG MT LOZG: 1.0000 | LOZENGE | OROMUCOSAL | Status: DC | PRN

## 2012-09-22 MED ORDER — MORPHINE SULFATE 0.5 MG/ML IJ SOLN
INTRAMUSCULAR | Status: AC
  Filled 2012-09-22: qty 10

## 2012-09-22 MED ORDER — ZOLPIDEM TARTRATE 5 MG PO TABS: 5.0000 mg | ORAL_TABLET | Freq: Every evening | ORAL | Status: DC | PRN

## 2012-09-22 MED ORDER — DIPHENHYDRAMINE HCL 50 MG/ML IJ SOLN: 12.5000 mg | INTRAMUSCULAR | Status: DC | PRN

## 2012-09-22 SURGICAL SUPPLY — 38 items
BARRIER ADHS 3X4 INTERCEED (GAUZE/BANDAGES/DRESSINGS) IMPLANT
BRR ADH 4X3 ABS CNTRL BYND (GAUZE/BANDAGES/DRESSINGS)
CLOTH BEACON ORANGE TIMEOUT ST (SAFETY) ×3 IMPLANT
DRAPE LG THREE QUARTER DISP (DRAPES) ×3 IMPLANT
DRESSING TELFA 8X3 (GAUZE/BANDAGES/DRESSINGS) ×1 IMPLANT
DRSG OPSITE POSTOP 4X10 (GAUZE/BANDAGES/DRESSINGS) ×2 IMPLANT
DURAPREP 26ML APPLICATOR (WOUND CARE) ×3 IMPLANT
ELECT REM PT RETURN 9FT ADLT (ELECTROSURGICAL) ×3
ELECTRODE REM PT RTRN 9FT ADLT (ELECTROSURGICAL) ×2 IMPLANT
EXTRACTOR VACUUM KIWI (MISCELLANEOUS) IMPLANT
GLOVE BIO SURGEON STRL SZ 6.5 (GLOVE) ×4 IMPLANT
GLOVE BIOGEL PI IND STRL 7.0 (GLOVE) ×4 IMPLANT
GLOVE BIOGEL PI INDICATOR 7.0 (GLOVE) ×4
GLOVE INDICATOR 7.0 STRL GRN (GLOVE) ×2 IMPLANT
GLOVE SURG SS PI 7.0 STRL IVOR (GLOVE) ×2 IMPLANT
GOWN PREVENTION PLUS LG XLONG (DISPOSABLE) ×9 IMPLANT
GOWN STRL REIN XL XLG (GOWN DISPOSABLE) ×2 IMPLANT
KIT ABG SYR 3ML LUER SLIP (SYRINGE) IMPLANT
NDL HYPO 25X5/8 SAFETYGLIDE (NEEDLE) IMPLANT
NEEDLE HYPO 25X5/8 SAFETYGLIDE (NEEDLE) IMPLANT
NS IRRIG 1000ML POUR BTL (IV SOLUTION) ×3 IMPLANT
PACK C SECTION WH (CUSTOM PROCEDURE TRAY) ×3 IMPLANT
PAD ABD 7.5X8 STRL (GAUZE/BANDAGES/DRESSINGS) ×1 IMPLANT
PAD OB MATERNITY 4.3X12.25 (PERSONAL CARE ITEMS) ×3 IMPLANT
SLEEVE SCD COMPRESS KNEE LRG (MISCELLANEOUS) IMPLANT
SLEEVE SCD COMPRESS KNEE MED (MISCELLANEOUS) IMPLANT
SPONGE GAUZE 4X4 12PLY (GAUZE/BANDAGES/DRESSINGS) ×1 IMPLANT
STRIP CLOSURE SKIN 1/2X4 (GAUZE/BANDAGES/DRESSINGS) ×1 IMPLANT
SUT PLAIN 2 0 XLH (SUTURE) ×1 IMPLANT
SUT VIC AB 0 CT1 36 (SUTURE) ×17 IMPLANT
SUT VIC AB 2-0 CT1 27 (SUTURE) ×3
SUT VIC AB 2-0 CT1 TAPERPNT 27 (SUTURE) ×2 IMPLANT
SUT VIC AB 4-0 KS 27 (SUTURE) ×1 IMPLANT
SUT VIC AB 4-0 PS2 27 (SUTURE) ×3 IMPLANT
TAPE CLOTH SURG 4X10 WHT LF (GAUZE/BANDAGES/DRESSINGS) ×1 IMPLANT
TOWEL OR 17X24 6PK STRL BLUE (TOWEL DISPOSABLE) ×9 IMPLANT
TRAY FOLEY CATH 14FR (SET/KITS/TRAYS/PACK) ×1 IMPLANT
WATER STERILE IRR 1000ML POUR (IV SOLUTION) ×3 IMPLANT

## 2012-09-22 NOTE — OR Nursing (Signed)
Cervical Cerclage removal start time of 0938 end time of 0941.  C-section start time of 0954.

## 2012-09-22 NOTE — Op Note (Signed)
Teresa Day   PROCEDURE DATE: 09/22/2012   PREOPERATIVE DIAGNOSIS: Intrauterine pregnancy at [redacted]w[redacted]d weeks gestation; prior cesarean section x 2; cerclage in place  POSTOPERATIVE DIAGNOSIS: The same  PROCEDURE: Repeat Low Transverse Cesarean Section and Cerclage removal  SURGEON:  Scheryl Darter, MC  ASSISTANT:  Napoleon Form, MD   INDICATIONS: Teresa Day is a 32 y.o. [phone] at [redacted]w[redacted]d here for cesarean section and cerclage removal secondary to the indications listed under preoperative diagnosis; please see preoperative note for further details.  The risks of cesarean section were discussed with the patient including but were not limited to: bleeding which may require transfusion or reoperation; infection which may require antibiotics; injury to bowel, bladder, ureters or other surrounding organs; injury to the fetus; need for additional procedures including hysterectomy in the event of a life-threatening hemorrhage; placental abnormalities wth subsequent pregnancies, incisional problems, thromboembolic phenomenon and other postoperative/anesthesia complications.   The patient concurred with the proposed plan, giving informed written consent for the procedure.    FINDINGS:  Viable female infant in cephalic presentation.  Apgars 8 and 9.  Clear amniotic fluid.  Intact placenta, three vessel cord.  Normal uterus, fallopian tubes and ovaries bilaterally.  ANESTHESIA: Spinal INTRAVENOUS FLUIDS: 3000 ml ESTIMATED BLOOD LOSS: 550 ml URINE OUTPUT:  500 ml SPECIMENS: Placenta sent to pathology/L&D COMPLICATIONS: None immediate  PROCEDURE IN DETAIL:  The patient preoperatively received intravenous antibiotics and had sequential compression devices applied to her lower extremities.  She was then taken to the operating room where spinal anesthesia was administered and was found to be adequate. She was then placed in a dorsal lithotomy position. After an adequate timeout was  performed, a speculum was placed in the vagina and the Mersilene stitch was visualized and grasped with pick-ups. The knot was cut, the suture removed from the cervix, and the speculum withdrawn. A foley catheter was placed into her bladder and attached to constant gravity.  The patient was placed in supine position with a leftward tilt, and prepped and draped in a sterile manner.  A Pfannenstiel skin incision was made with scalpel and carried through to the underlying layer of fascia. The fascia was incised in the midline, and this incision was extended bilaterally using the Mayo scissors.  Kocher clamps were applied to the superior aspect of the fascial incision and the underlying rectus muscles were dissected off bluntly. A similar process was carried out on the inferior aspect of the fascial incision. The rectus muscles were separated in the midline bluntly and the peritoneum was entered bluntly. Attention was turned to the lower uterine segment where a bladder flap was created and a bladder blade was placed. A low transverse hysterotomy was made with a scalpel and extended bilaterally bluntly. The infant was successfully delivered, the cord was clamped and cut and the infant was handed over to awaiting neonatology team. Uterine massage was then administered, and the placenta delivered intact with a three-vessel cord. The uterus was then cleared of clot and debris.  The hysterotomy was closed with 0 Vicryl in a running locked fashion, and an imbricating layer was also placed with 0 Vicryl. The pelvis was cleared of all clot and debris. Hemostasis was confirmed on all surfaces.  The peritoneum was reapproximated using 0 Vicryl in running stitch. The fascia was then closed using 0 Vicryl in a running fashion.  The subcutaneous layer was irrigated, then reapproximated with 2-0 plain gut interrupted stitches, and the skin was closed with a 4-0 Vicryl subcuticular stitch. The  patient tolerated the procedure well.  Sponge, lap, instrument and needle counts were correct x 2.  The foley catheter drained clear urine throughout the procedure. The patient was taken to the recovery room in stable condition.   Napoleon Form, MD 09/22/2012   10:51 AM

## 2012-09-22 NOTE — H&P (Signed)
Teresa Day is a 32 y.o. female presenting for scheduled repeat cesarean section and cerclage removal. Patient has had 2 prior cesarean sections and 2 preterm losses. [facility] She has a cerclage placed on 04/01/12 and has been in 17-P throughout the pregnancy.   Maternal Medical History:  Reason for admission: Repeat cesarean section and removal of cerclage  Fetal activity: Perceived fetal activity is normal.   Last perceived fetal movement was within the past hour.    Prenatal complications: Incompetent cervix, cerclage placed, received 17-P  Prenatal Complications - Diabetes: none.    OB History   Grav Para Term Preterm Abortions TAB SAB Ect Mult Living   5 4 2 2  0 0  0 0 2     Past Medical History  Diagnosis Date  . Medical history non-contributory    Past Surgical History  Procedure Laterality Date  . Cesarean section    . Cervical cerclage  04/01/2012    Procedure: CERCLAGE CERVICAL;  Surgeon: Reva Bores, MD;  Location: [facility];  Service: Gynecology;  Laterality: N/A;   Family History: family history includes Hypertension in her father and mother.  There is no history of Other. Social History:  reports that she has never smoked. She has never used smokeless tobacco. She reports that she does not drink alcohol or use illicit drugs.   Prenatal Transfer Tool  Maternal Diabetes: No Genetic Screening: Declined Maternal Ultrasounds/Referrals: Normal Fetal Ultrasounds or other Referrals:  None Maternal Substance Abuse:  No Significant Maternal Medications:  Meds include: Progesterone Significant Maternal Lab Results:  Lab values include: Group B Strep negative Other Comments:  None  Review of Systems  Constitutional: Negative.   Respiratory: Negative.   Gastrointestinal: Negative.       Last menstrual period 12/24/2011. Maternal Exam:  Abdomen: Surgical scars: low transverse.   Fundal height is term.    Estimated fetal weight is 7 lb.    Introitus: not evaluated.     Physical Exam  Constitutional: She is oriented to person, place, and time. She appears well-developed. No distress.  Neck: Normal range of motion.  Cardiovascular: Normal rate, regular rhythm and normal heart sounds.   Respiratory: Effort normal and breath sounds normal.  GI: Soft. There is no tenderness.  Musculoskeletal: She exhibits no edema and no tenderness.  Neurological: She is alert and oriented to person, place, and time.  Skin: Skin is warm and dry.  Psychiatric: She has a normal mood and affect. Her behavior is normal.    Prenatal labs: ABO, Rh: --/--/B POS (02/18 0950) Antibody: NEG (02/18 0950) Rubella: 62.4 (08/21 1159) RPR: NON REACTIVE (02/18 0951)  HBsAg: NEGATIVE (08/21 1159)  HIV: NON REACTIVE (11/21 1021)  GBS:     Assessment/Plan: 32 y.o. [id number] at [redacted]w[redacted]d here for repeat cesarean section and cerclage removal. - Preop labs done. - Ancef ordered - Consent signed - To OR when ready   Napoleon Form 09/22/2012, 7:49 AM  Agree with the above note. Patient voices understanding of risk of anesthesia, bleeding, infection, visceral organ damage, removal of cerclage. Counseled with interpreter present.  ARNOLD,JAMES 09/22/2012 8:58 AM

## 2012-09-22 NOTE — Anesthesia Postprocedure Evaluation (Signed)
  Anesthesia Post Note  Patient: Teresa Day  Procedure(s) Performed: Procedure(s) (LRB): CESAREAN SECTION (N/A) CERCLAGE CERVICAL REMOVAL (N/A)  Anesthesia type: Spinal  Patient location: PACU  Post pain: Pain level controlled  Post assessment: Post-op Vital signs reviewed  Last Vitals:  Filed Vitals:   09/22/12 1115  BP: 102/50  Pulse: 70  Temp:   Resp: 20    Post vital signs: Reviewed  Level of consciousness: awake  Complications: No apparent anesthesia complications

## 2012-09-22 NOTE — Transfer of Care (Signed)
Immediate Anesthesia Transfer of Care Note  Patient: Teresa Day  Procedure(s) Performed: Procedure(s) with comments: CESAREAN SECTION (N/A) - Repeat CERCLAGE CERVICAL REMOVAL (N/A)  Patient Location: PACU  Anesthesia Type:Spinal  Level of Consciousness: awake, alert , oriented and patient cooperative  Airway & Oxygen Therapy: Patient Spontanous Breathing  Post-op Assessment: Report given to PACU RN and Post -op Vital signs reviewed and stable  Post vital signs: Reviewed and stable  Complications: No apparent anesthesia complications

## 2012-09-22 NOTE — Anesthesia Preprocedure Evaluation (Signed)
Anesthesia Evaluation  Patient identified by MRN, date of birth, ID band Patient awake    Reviewed: Allergy & Precautions, H&P , NPO status , Patient's Chart, lab work & pertinent test results  Airway Mallampati: III      Dental no notable dental hx.    Pulmonary neg pulmonary ROS,  breath sounds clear to auscultation  Pulmonary exam normal       Cardiovascular Exercise Tolerance: Good negative cardio ROS  Rhythm:regular Rate:Normal     Neuro/Psych negative neurological ROS  negative psych ROS   GI/Hepatic negative GI ROS, Neg liver ROS,   Endo/Other  negative endocrine ROSMorbid obesity  Renal/GU negative Renal ROS  negative genitourinary   Musculoskeletal   Abdominal Normal abdominal exam  (+)   Peds  Hematology negative hematology ROS (+)   Anesthesia Other Findings   Reproductive/Obstetrics (+) Pregnancy                           Anesthesia Physical Anesthesia Plan  ASA: III  Anesthesia Plan: Spinal   Post-op Pain Management:    Induction:   Airway Management Planned:   Additional Equipment:   Intra-op Plan:   Post-operative Plan:   Informed Consent: I have reviewed the patients History and Physical, chart, labs and discussed the procedure including the risks, benefits and alternatives for the proposed anesthesia with the patient or authorized representative who has indicated his/her understanding and acceptance.     Plan Discussed with: Anesthesiologist, CRNA and Surgeon  Anesthesia Plan Comments:         Anesthesia Quick Evaluation

## 2012-09-22 NOTE — Anesthesia Procedure Notes (Signed)
Spinal  Patient location during procedure: OR Start time: 09/22/2012 9:35 AM Staffing Performed by: anesthesiologist  Preanesthetic Checklist Completed: patient identified, site marked, surgical consent, pre-op evaluation, timeout performed, IV checked, risks and benefits discussed and monitors and equipment checked Spinal Block Patient position: sitting Prep: site prepped and draped and DuraPrep Patient monitoring: heart rate, cardiac monitor, continuous pulse ox and blood pressure Approach: midline Location: L3-4 Injection technique: single-shot Needle Needle type: Sprotte  Needle gauge: 24 G Needle length: 9 cm Assessment Sensory level: T4 Additional Notes Clear free flow CSF on first attempt.  No paresthesia.  Patient tolerated procedure well with no apparent complications.  Jasmine December, MD

## 2012-09-23 ENCOUNTER — Encounter (HOSPITAL_COMMUNITY): Payer: Self-pay | Admitting: *Deleted

## 2012-09-23 LAB — CBC
Hemoglobin: 11.7 g/dL — ABNORMAL LOW (ref 12.0–15.0)
Platelets: 219 10*3/uL (ref 150–400)
RBC: 4.05 MIL/uL (ref 3.87–5.11)
WBC: 9.2 10*3/uL (ref 4.0–10.5)

## 2012-09-23 NOTE — Progress Notes (Signed)
UR chart review completed.  

## 2012-09-23 NOTE — Plan of Care (Signed)
Problem: Discharge Progression Outcomes Goal: Barriers To Progression Addressed/Resolved Outcome: Completed/Met Date Met:  09/23/12 Using interpreter

## 2012-09-23 NOTE — Progress Notes (Signed)
Post Partum Day 1/ [facility] Subjective: no complaints, up ad lib, voiding and tolerating PO, small lochia, plans to breastfeed, unsuure of contraception  Objective: Blood pressure 107/66, pulse 70, temperature 98.1 F (36.7 C), temperature source Oral, resp. rate 20, height 4\' 11"  (1.499 m), weight 205 lb (92.987 kg), last menstrual period 12/24/2011, SpO2 94.00%, unknown if currently breastfeeding.  Physical Exam:  General: alert, cooperative and no distress Lochia:normal flow Chest: CTAB Heart: RRR no m/r/g Abdomen: +BS, soft, nontender, DSG dry/ intact Uterine Fundus: firm DVT Evaluation: No evidence of DVT seen on physical exam. Extremities: trace edema   Recent Labs  09/20/12 0951 09/23/12 0515  HGB 13.1 11.7*  HCT 37.0 34.0*    Assessment/Plan: POD #1; stable Expectant management   LOS: 1 day   CRESENZO-DISHMAN,Ansel Ferrall 09/23/2012, 8:06 AM

## 2012-09-24 MED ORDER — SENNOSIDES-DOCUSATE SODIUM 8.6-50 MG PO TABS: 2.0000 | ORAL_TABLET | Freq: Every day | ORAL | Status: DC

## 2012-09-24 MED ORDER — OXYCODONE-ACETAMINOPHEN 5-325 MG PO TABS: 1.0000 | ORAL_TABLET | ORAL | Status: DC | PRN

## 2012-09-24 NOTE — Progress Notes (Signed)
I have seen patient and examination done.  I agree with documentation and plan as noted in resident/PA's note. Latausha Flamm V 09/24/2012 7:08 AM

## 2012-09-24 NOTE — Progress Notes (Signed)
Subjective: Postpartum Day 2: Cesarean Delivery Patient reports tolerating PO, + flatus, + BM and no problems voiding. Incisional pain controlled with pills.     Objective: Vital signs in last 24 hours: Temp:  [98 F (36.7 C)] 98 F (36.7 C) (02/21 1737) Pulse Rate:  [61] 61 (02/21 1737) Resp:  [18] 18 (02/21 1737) BP: (113)/(72) 113/72 mmHg (02/21 1737) SpO2:  [94 %-96 %] 96 % (02/21 0956)  Physical Exam:  General: alert, cooperative and no distress Lochia: appropriate Uterine Fundus: firm Incision: healing well, no significant drainage, no dehiscence DVT Evaluation: No evidence of DVT seen on physical exam.   Recent Labs  09/23/12 0515  HGB 11.7*  HCT 34.0*    Assessment/Plan: Status post Cesarean section. Doing well postoperatively.  Discharge home with standard precautions and return to clinic in 4-6 weeks. Breast and bottle feeding. Want to use OCP but denied offer of prescriptions.  Set up baby love nurse for staple removal.   Teresa Day 09/24/2012, 6:22 AM

## 2012-09-24 NOTE — Discharge Summary (Signed)
Physician Discharge Summary  Patient ID: [place] MRN: [id number] DOB/AGE: 32-Mar-1982 32 y.o.  Admit date: 09/22/2012 Discharge date: 09/24/2012  Admission Diagnoses: [redacted] week gestation   Discharge Diagnoses:  Active Problems:   * No active hospital problems. *   Discharged Condition: stable  Hospital Course: 32 year old [id number] who presented at 39.[redacted] weeks EGA for schedule LTCS (2 previous) and cerclage removal. The procedure was performed, and she gave birth to viable healthy girl. The patient had an uncomplicated post-partum stay and was requesting leave on POD#2.   Discharge Information: Status post Cesarean section. Doing well postoperatively.  Discharge home with standard precautions and return to clinic in 4-6 weeks.  Breast and bottle feeding.  Want to use OCP but denied offer of prescriptions.  Set up baby love nurse for staple removal.    Discharge Exam: Blood pressure 118/81, pulse 70, temperature 98 F (36.7 C), temperature source Oral, resp. rate 18, height 4\' 11"  (1.499 m), weight 92.987 kg (205 lb), last menstrual period 12/24/2011, SpO2 96.00%, unknown if currently breastfeeding.  General: alert, cooperative and no distress  Lochia: appropriate  Uterine Fundus: firm  Incision: healing well, no significant drainage, no dehiscence  DVT Evaluation: No evidence of DVT seen on physical exam.    Disposition: 01-Home or Self Care       Future Appointments Provider Department Dept Phone   10/20/2012 1:00 PM Willodean Rosenthal, MD [facility] [phone]       Medication List    STOP taking these medications       hydroxyprogesterone caproate 250 mg/mL Oil  Commonly known as:  DELALUTIN      TAKE these medications       oxyCODONE-acetaminophen 5-325 MG per tablet  Commonly known as:  PERCOCET/ROXICET  Take 1-2 tablets by mouth every 4 (four) hours as needed.     PRENATAL VITAMINS 0.8 MG tablet  Take 1 tablet by mouth  daily.     senna-docusate 8.6-50 MG per tablet  Commonly known as:  Senokot-S  Take 2 tablets by mouth at bedtime.       Follow-up Information   Follow up with [facility] In 6 weeks.   Contact information:   [address] [place] [address] [phone]      Signed: Mat Carne 09/24/2012, 7:52 AM

## 2012-09-25 NOTE — Discharge Summary (Signed)
Documentation reviewed and agreed with.

## 2012-09-27 ENCOUNTER — Ambulatory Visit (INDEPENDENT_AMBULATORY_CARE_PROVIDER_SITE_OTHER): Payer: Self-pay | Admitting: *Deleted

## 2012-09-27 VITALS — BP 133/85 | HR 80 | Temp 97.8°F

## 2012-09-27 DIAGNOSIS — Z5189 Encounter for other specified aftercare: Secondary | ICD-10-CM

## 2012-09-27 DIAGNOSIS — Z09 Encounter for follow-up examination after completed treatment for conditions other than malignant neoplasm: Secondary | ICD-10-CM

## 2012-09-27 NOTE — Progress Notes (Signed)
Patient in today for staple removal. Incision was covered with large dressing. I removed the dressing and found that patient does not have staples. Incision was covered by steristrips. I cleaned her incision. It was well healed, no redness or drainage. Clean steri strips applied to incision and patient instructed on how to clean and care for incision. Also informed her of what signs of infection to look for such as fever, redness, swelling, drainage or foul odor. Patient voiced understanding. She will followup for her postpartum as scheduled.

## 2012-09-30 ENCOUNTER — Telehealth (HOSPITAL_COMMUNITY): Payer: Self-pay | Admitting: *Deleted

## 2012-09-30 NOTE — Telephone Encounter (Signed)
Resolve episode 

## 2012-10-02 ENCOUNTER — Inpatient Hospital Stay (HOSPITAL_COMMUNITY)
Admission: AD | Admit: 2012-10-02 | Discharge: 2012-10-02 | Disposition: A | Discharge status: Home / Self Care | Payer: Self-pay | Source: Ambulatory Visit | Attending: Obstetrics & Gynecology | Admitting: Obstetrics & Gynecology

## 2012-10-02 ENCOUNTER — Encounter (HOSPITAL_COMMUNITY): Payer: Self-pay | Admitting: *Deleted

## 2012-10-02 DIAGNOSIS — L231 Allergic contact dermatitis due to adhesives: Secondary | ICD-10-CM

## 2012-10-02 DIAGNOSIS — L253 Unspecified contact dermatitis due to other chemical products: Secondary | ICD-10-CM

## 2012-10-02 DIAGNOSIS — O99893 Other specified diseases and conditions complicating puerperium: Secondary | ICD-10-CM | POA: Insufficient documentation

## 2012-10-02 DIAGNOSIS — L259 Unspecified contact dermatitis, unspecified cause: Secondary | ICD-10-CM | POA: Insufficient documentation

## 2012-10-02 DIAGNOSIS — T40605A Adverse effect of unspecified narcotics, initial encounter: Secondary | ICD-10-CM | POA: Insufficient documentation

## 2012-10-02 MED ORDER — DIPHENHYDRAMINE HCL 25 MG PO CAPS
25.0000 mg | ORAL_CAPSULE | Freq: Once | ORAL | Status: AC
  Administered 2012-10-02: 25 mg via ORAL
  Filled 2012-10-02: qty 1

## 2012-10-02 MED ORDER — TRIAMCINOLONE ACETONIDE 0.1 % EX CREA: TOPICAL_CREAM | Freq: Two times a day (BID) | CUTANEOUS | Status: DC

## 2012-10-02 MED ORDER — DIPHENHYDRAMINE HCL 25 MG PO CAPS: 25.0000 mg | ORAL_CAPSULE | Freq: Once | ORAL | Status: DC

## 2012-10-02 NOTE — MAU Note (Signed)
Pt. Also complaining of drainage from her c/s incision.  Dr. Thad Ranger removed steri strips

## 2012-10-02 NOTE — MAU Note (Signed)
Pt postpartum C/S 09/22/2012, on 3/1 noticed an itchy rash on her abdomen.  Denies any new lotions, soaps or detergents.

## 2012-10-02 NOTE — MAU Provider Note (Signed)
History     CSN: [id number]  Arrival date and time: 10/02/12 2113   First Provider Initiated Contact with Patient 10/02/12 2151      Chief Complaint  Patient presents with  . Allergic Reaction   HPI 32 y.o. [id number] s/p C/S on 09/22/12 with itchy rash on stomach and drainage from incision. Itching started 4 days ago. Rash spreading to wrists today. No new meds. Only taking percocet and senna-colace. No new detergents or soaps. Was seen in clinic Tuesday for post-op visit and removed bandage. Steri-strips still in place. Redness and itching around incision for 4 days also, some drainage/wetness from left side of wound. No fever.  OB History   Grav Para Term Preterm Abortions TAB SAB Ect Mult Living   5 5 3 2  0 0  0 0 3       Past Medical History  Diagnosis Date  . Medical history non-contributory     Past Surgical History  Procedure Laterality Date  . Cesarean section    . Cervical cerclage  04/01/2012    Procedure: CERCLAGE CERVICAL;  Surgeon: Reva Bores, MD;  Location: [facility];  Service: Gynecology;  Laterality: N/A;  . Cesarean section N/A 09/22/2012    Procedure: CESAREAN SECTION;  Surgeon: Adam Phenix, MD;  Location: [facility];  Service: Obstetrics;  Laterality: N/A;  Repeat  . Cervical cerclage N/A 09/22/2012    Procedure: CERCLAGE CERVICAL REMOVAL;  Surgeon: Adam Phenix, MD;  Location: [facility];  Service: Obstetrics;  Laterality: N/A;    Family History  Problem Relation Age of Onset  . Other Neg Hx   . Hypertension Mother   . Hypertension Father     History  Substance Use Topics  . Smoking status: Never Smoker   . Smokeless tobacco: Never Used  . Alcohol Use: No    Allergies: No Known Allergies  Prescriptions prior to admission  Medication Sig Dispense Refill  . oxyCODONE-acetaminophen (PERCOCET/ROXICET) 5-325 MG per tablet Take 1-2 tablets by mouth every 4 (four) hours as needed.  30 tablet  0  . Prenatal Multivit-Min-Fe-FA (PRENATAL VITAMINS) 0.8 MG  tablet Take 1 tablet by mouth daily.  30 tablet  9  . senna-docusate (SENOKOT-S) 8.6-50 MG per tablet Take 2 tablets by mouth at bedtime.  30 tablet  0    Review of Systems  Constitutional: Negative for fever and chills.  Eyes: Negative for blurred vision and double vision.  Respiratory: Negative for shortness of breath and wheezing.   Gastrointestinal: Negative for nausea, vomiting, abdominal pain, diarrhea and constipation.  Genitourinary: Negative for dysuria.  Neurological: Negative for dizziness and headaches.   Physical Exam   Blood pressure 119/87, pulse 75, temperature 97.5 F (36.4 C), temperature source Oral, resp. rate 16, height 4\' 10"  (1.473 m), weight 85.821 kg (189 lb 3.2 oz), SpO2 98.00%, currently breastfeeding.  Physical Exam  Constitutional: She is oriented to person, place, and time. She appears well-developed and well-nourished. No distress.  HENT:  Head: Normocephalic and atraumatic.  Eyes: Conjunctivae and EOM are normal.  Neck: Normal range of motion. Neck supple.  Cardiovascular: Normal rate and regular rhythm.   Respiratory: Effort normal. No respiratory distress.  GI: Soft. There is no tenderness. There is no rebound and no guarding.  Musculoskeletal: Normal range of motion. She exhibits no edema and no tenderness.  Neurological: She is alert and oriented to person, place, and time.  Skin:  Erythematous, maculopapular rash on abdomen and wrists. Incision is well-healed, intact,  dry. Well-demarcated, rectangular-shaped area of erythema around incision with raised papules and some areas of weeping.  Psychiatric: She has a normal mood and affect.    MAU Course  Procedures   Assessment and Plan  32 y.o. [id number] at 10 days postpartum with - Contact dermatitis/allergic reaction on abdomen - Triamcinolone cream. Stop percocet and senna/colace. - Benadryl for itching - Return for fever/chills/abdominal pain - if not improving, consider treating for yeast  but appears allergic/inflammatory  Napoleon Form 10/02/2012, 10:02 PM

## 2012-10-20 ENCOUNTER — Encounter: Payer: Self-pay | Admitting: Obstetrics & Gynecology

## 2012-10-20 ENCOUNTER — Ambulatory Visit (INDEPENDENT_AMBULATORY_CARE_PROVIDER_SITE_OTHER): Payer: Medicaid Other | Admitting: Obstetrics & Gynecology

## 2012-10-20 MED ORDER — NORETHINDRONE 0.35 MG PO TABS: 1.0000 | ORAL_TABLET | Freq: Every day | ORAL | Status: DC

## 2012-10-20 NOTE — Patient Instructions (Signed)
Eleccin del mtodo anticonceptivo  (Contraception Choices) La anticoncepcin (control de la natalidad) es el uso de cualquier mtodo o dispositivo para Location manager. A continuacin se indican algunos de esos mtodos.  MTODOS HORMONALES   Implante anticonceptivo. Es un tubo plstico delgado que contiene la hormona progesterona. No contiene estrgenos. El mdico inserta el tubo en la parte interna del brazo. El tubo puede Geneticist, molecular durante 3 aos. Despus de los 3 aos debe retirarse. El implante impide que los ovarios liberen vulos (ovulacin), espesa el moco cervical, lo que evita que los espermatozoides ingresen al tero y hace ms delgada la membrana que cubre el interior del tero.  Inyecciones de progesterona sola. Estas inyecciones se administran cada 3 meses para evitar el embarazo. La progesterona sinttica impide que los ovarios liberen vulos. Tambin hace que el moco cervical se espese y modifica el recubrimiento interno del tero. Esto hace ms difcil que los espermatozoides sobrevivan en el tero.  Pldoras anticonceptivas. Las pldoras anticonceptivas contienen estrgenos y Education officer, museum. Actan impidiendo que el vulo se forme en el ovario(ovulacin). Las pldoras anticonceptivas son recetadas por el mdico.Tambin se utilizan para tratar los perodos menstruales abundantes.  Minipldora. Este tipo de pldora anticonceptiva contiene slo hormona progesterona. Deben tomarse todos los [address]  [address] del mes y debe recetarlas el mdico.  Parches anticonceptivos. El parche contiene hormonas similares a las que contienen las pldoras anticonceptivas. Deben cambiarse una vez por semana y se utilizan bajo prescripcin mdica.  Anillo vaginal. Anillo vaginal contiene hormonas similares a las que contienen las pldoras anticonceptivas. Se deja colocado durante tres semanas, se lo retira durante 1 semana y luego se coloca uno nuevo. La paciente debe sentirse cmoda para insertar y  retirar el anillo de la vagina.Es necesaria la receta del mdico.  Anticonceptivos de Associate Professor. Los anticonceptivos de emergencia son mtodos para evitar un embarazo despus de una relacin sexual sin proteccin. Esta pldora puede tomarse inmediatamente despus de Child psychotherapist sexuales o hasta 5 [place] de haber tenido sexo sin proteccin. Es ms efectiva si se toma poco tiempo despus. Los anticonceptivos de emergencia estn disponibles sin prescripcin mdica. Consltelo con su farmacutico. No use los anticonceptivos de emergencia como nico mtodo anticonceptivo. MTODOS DE BARRERA   Condn masculino. Es una vaina delgada (ltex o goma) que se [place] en el pene durante el acto sexual. Deri Fuelling con espermicida para aumentar la efectividad.  Condn femenino. Es una vaina blanda y floja que se adapta suavemente a la vagina antes de las relaciones sexuales.  Diafragma. Es una barrera de ltex redonda y Casimer Bilis que debe ser ajustada por un profesional. Se inserta en la vagina, junto con un gel espermicida. Debe insertarse antes de Management consultant. Debe dejar el diafragma colocado en la vagina durante 6 a 8 horas despus de la relacin sexual.  Capuchn cervical. Es una taza de ltex o plstico, redonda y [place] que cubre el cuello del tero y debe ser ajustada por un mdico. Puede dejarlo colocado en la vagina hasta 48 horas despus de las Clinical research associate.  Esponja. Es una pieza blanda y circular de espuma de poliuretano. Contiene un espermicida. Se inserta en la vagina despus de mojarla y antes de las [employer].  Espermicidas. Los espermicidas son qumicos que matan o bloquean el esperma y no lo dejan ingresar al cuello del tero y al tero. Vienen en forma de cremas, geles, supositorios, espuma o comprimidos. No es necesario tener Emergency planning/management officer. Se insertan en la vagina con un aplicador  antes de [employer]. El proceso debe repetirse cada vez que tiene  relaciones sexuales. ANTICONCEPTIVOS INTRAUTERINOS   Dispositivo intrauterino (DIU). Es un dispositivo en forma de T que se coloca en el tero durante el perodo menstrual, para Location manager. Hay dos tipos:  DIU de cobre. Este tipo de DIU est recubierto con un alambre de cobre y se inserta dentro del tero. El cobre hace que el tero y las trompas de Falopio produzcan un liquido que [employer] espermatozoides. Puede permanecer colocado durante 10 aos.  DIU hormonal. Este tipo de DIU contiene la hormona progestina (progesterona sinttica). La hormona espesa el moco cervical y evita que los espermatozoides ingresen al tero y tambin afina la membrana que cubre el tero para evitar la implantacin del vulo fertilizado. La hormona debilita o destruye los espermatozoides que ingresan al tero. Puede permanecer colocado durante 5 aos. MTODOS ANTICONCEPTIVOS PERMANENTES   Ligadura de trompas en la mujer. La ligadura de trompas en la mujer se realiza sellando, atando u obstruyendo quirrgicamente las trompas de Falopio lo que impide que el vulo descienda hacia el tero.  Esterilizacin masculina. Se realiza atando los conductos por los que pasan los espermatozoides (vasectoma).Esto impide que el esperma ingrese a la vagina durante el acto sexual. Luego del procedimiento, el hombre puede eyacular lquido (semen). MTODOS DE PLANIFICACIN NATURAL   Planificacin familiar natural.  Consiste en no tener relaciones sexuales o usar un mtodo de barrera (condn, [place], capuchn cervical) en los [employer] la mujer podra quedar [place].  Mtodo calendario.  Consiste en el seguimiento de la duracin de cada ciclo menstrual y la identificacin de los perodos frtiles.  Mtodo de Occupational hygienist.  Consiste en evitar las relaciones sexuales durante la ovulacin.  Mtodo sintotrmico. Paramedic las relaciones sexuales en la poca en la que se est ovulando, utilizando un termmetro y  tendiendo en cuenta los sntomas de la ovulacin.  Mtodo post-ovulacin. Consiste en planificar las relaciones sexuales para despus de haber ovulado. Independientemente del tipo o mtodo anticonceptivo que usted elija, es importante que use condones para protegerse contra las enfermedades de transmisin sexual (ETS). Hable con su mdico con respecto a qu mtodo anticonceptivo es el ms apropiado para usted.  Document Released: 07/20/2005 Document Revised: 10/12/2011 [facility] Patient Information 2013 [place], [place].

## 2012-10-20 NOTE — Progress Notes (Signed)
Patient ID: Teresa Day, female   DOB: [birth year], 32 y.o.   MRN: [id number] Subjective:     Teresa Day is a 32 y.o. female who presents for a postpartum visit. She is 4 weeks postpartum following a low cervical transverse Cesarean section. I have fully reviewed the prenatal and intrapartum course. The delivery was at 39 gestational weeks. Outcome: repeat cesarean section, low transverse incision. Anesthesia: spinal. Postpartum course has been uncomplicated. Baby's course has been unremarkable. Baby is feeding by breast. Bleeding no bleeding. Bowel function is normal. Bladder function is normal. Patient is not sexually active. Contraception method is none. Postpartum depression screening: negative.  The following portions of the patient's history were reviewed and updated as appropriate: allergies, current medications, past family history, past medical history, past social history, past surgical history and problem list.  Review of Systems Pertinent items are noted in HPI.   Objective:    BP 118/84  Pulse 83  Temp(Src) 97.6 F (36.4 C) (Oral)  Ht 5' (1.524 m)  Wt 185 lb 11.2 oz (84.233 kg)  BMI 36.27 kg/m2  Breastfeeding? Yes  General:  alert and no distress           Abdomen: soft, non-tender; bowel sounds normal; no masses,  no organomegaly  Incision clean dry and intact                         Assessment:     4 weeks postpartum exam. Pap smear not done at today's visit.   Plan:    1. Contraception: none 2. Desires OCP's will start POP's  3. Follow up in: 1 year at [facility] or as needed.  4. Pt instructed to switch to combined OCP's after she stops breast feeding

## 2014-02-10 IMAGING — US US OB TRANSVAGINAL
1 series · 14 of 20 positions shown · non-contrast
Comparison: none

[Series 1: us ob transvaginal · 20 acquisitions, 14 frames shown]
[im 1/20]
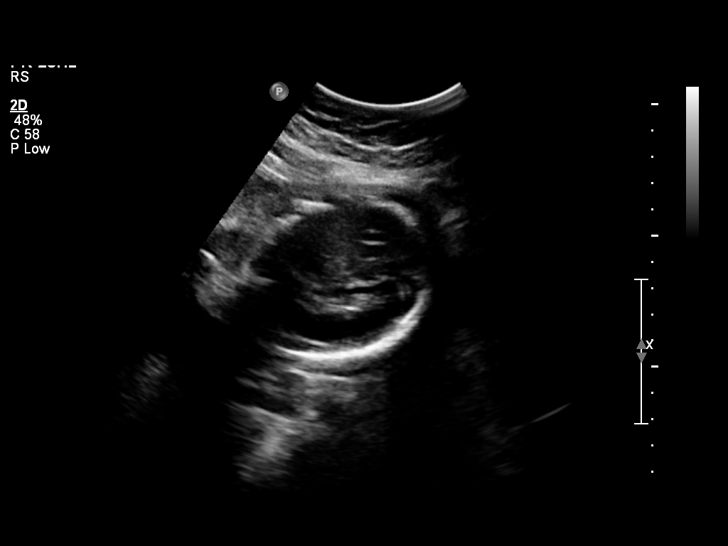
[im 3/20]
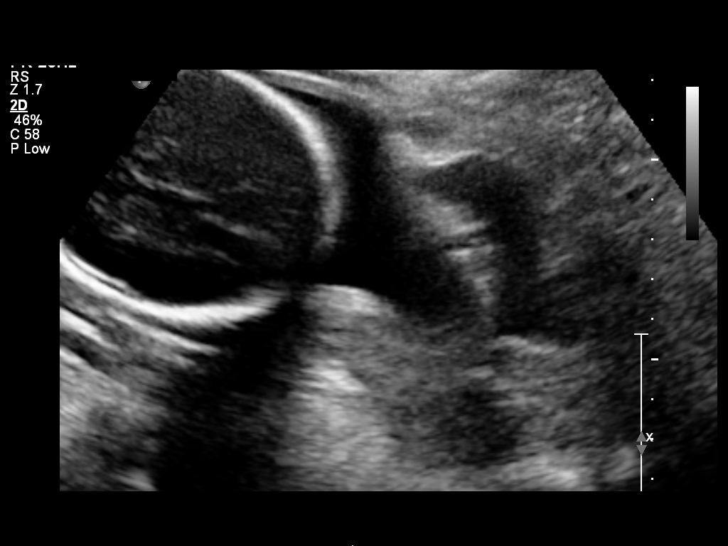
[im 4/20]
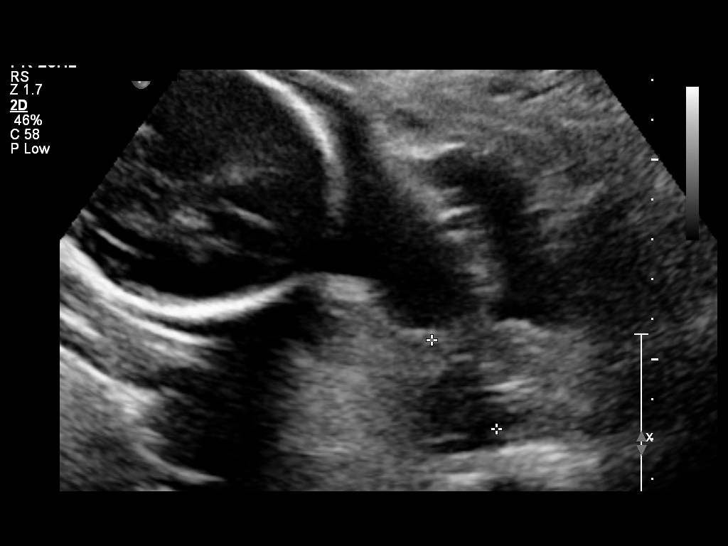
[im 6/20]
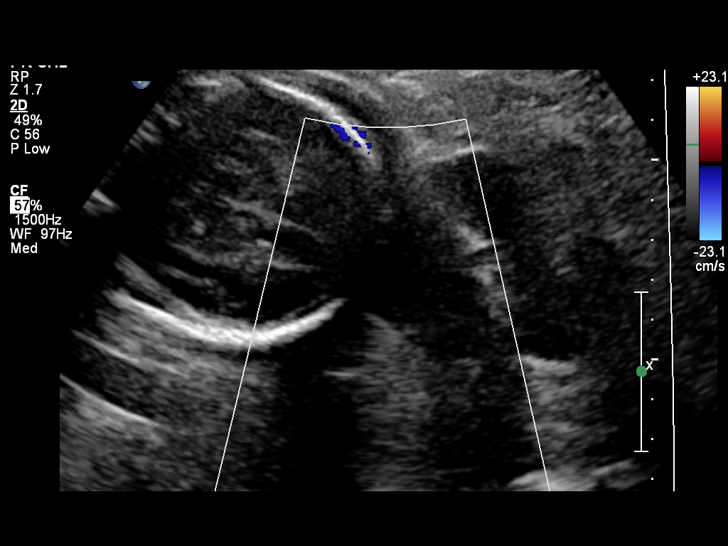
[im 7/20]
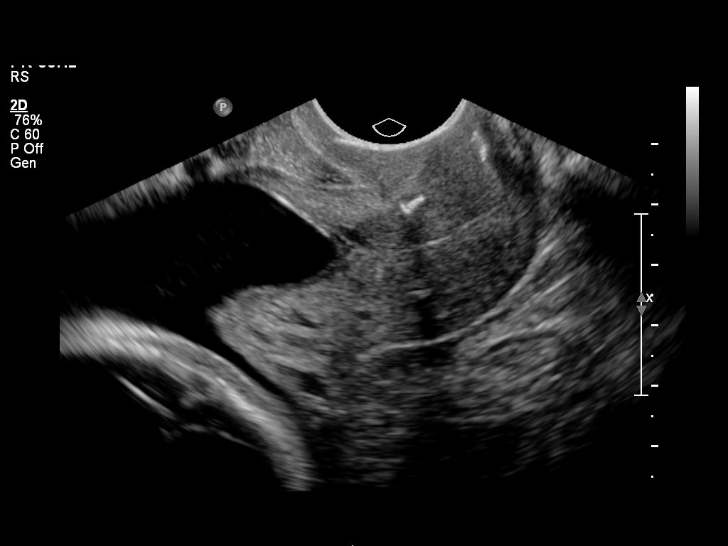
[im 8/20]
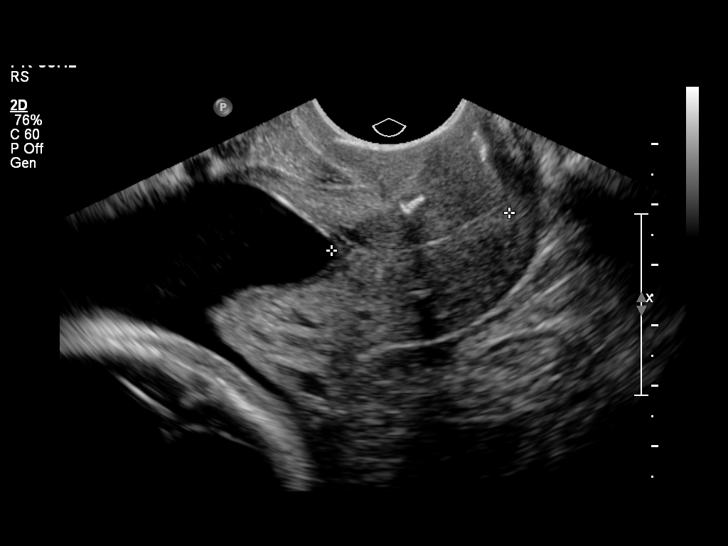
[im 10/20]
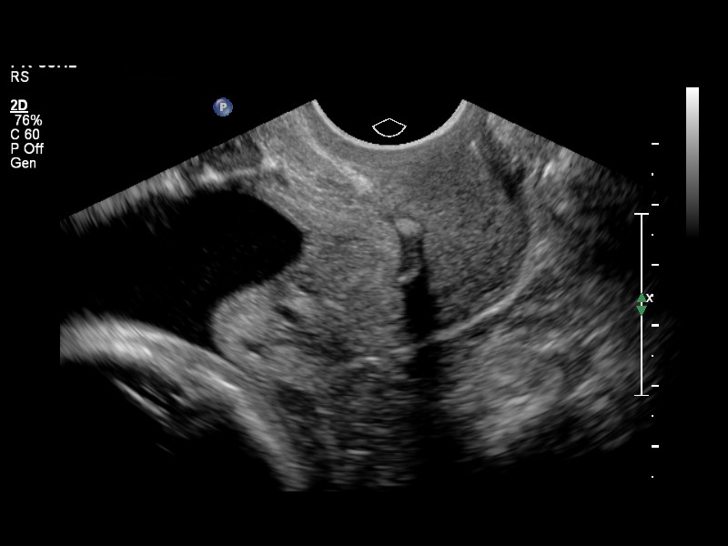
[im 11/20]
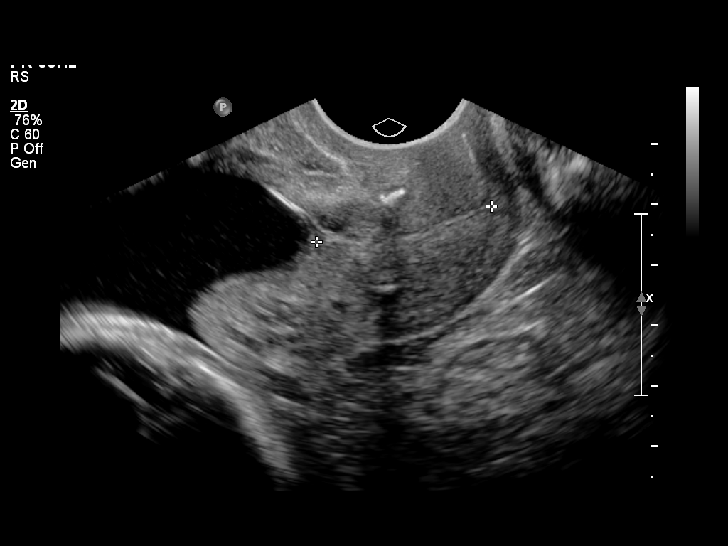
[im 13/20]
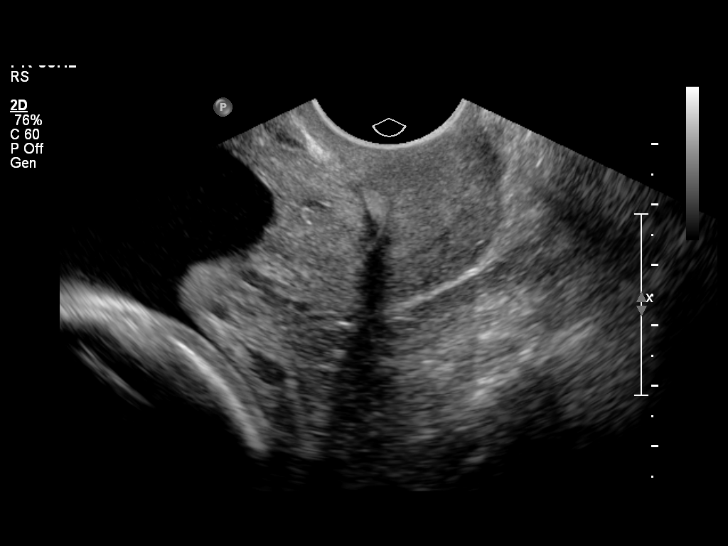
[im 14/20]
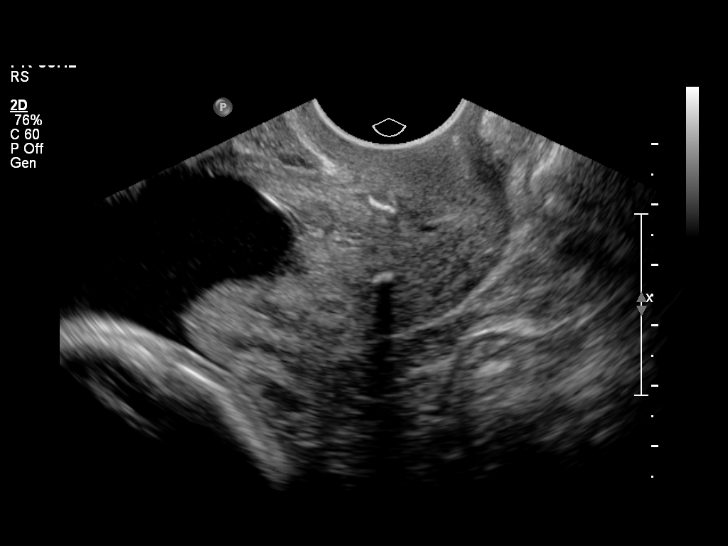
[im 16/20]
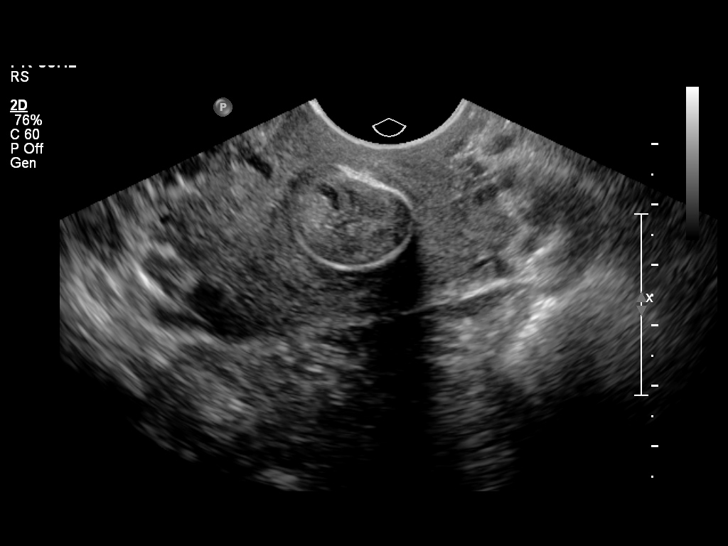
[im 17/20]
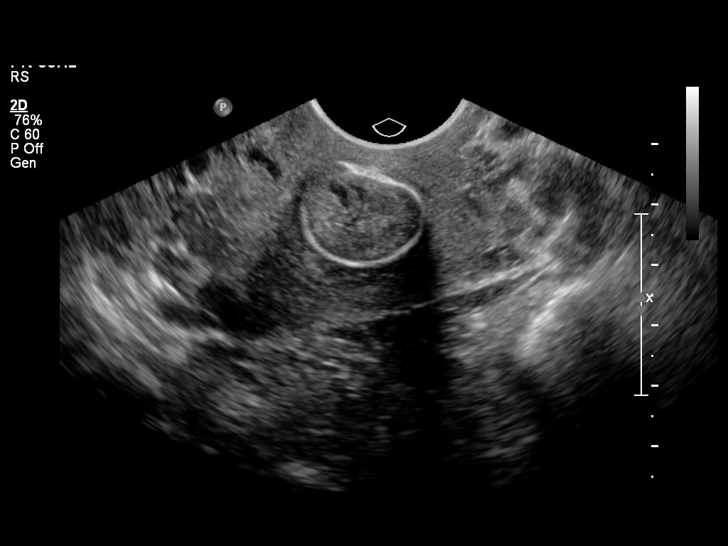
[im 18/20]
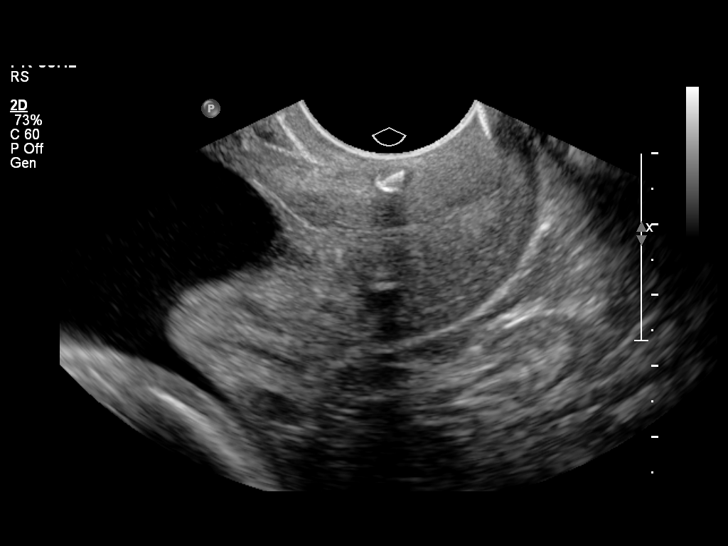
[im 20/20]
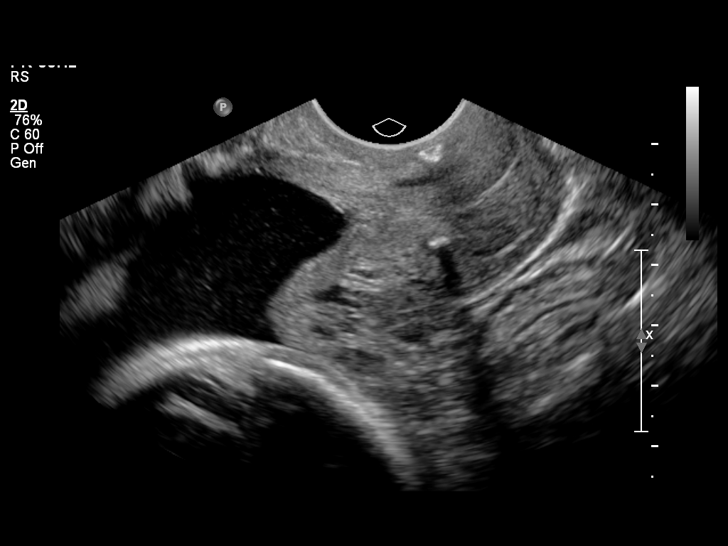

[14 of 20 positions shown; findings below may reference images not displayed]

OBSTETRICS REPORT
                      (Signed Final 06/16/2012 [DATE])

             DON LOLITO

Service(s) Provided

 US OB TRANSVAGINAL                                    76817.0
Indications

 Obesity
 Poor obstetric history: Previous midtrimester loss
 Cervical shortening
 Cerclage
Fetal Evaluation

 Num Of Fetuses:    1
 Fetal Heart Rate:  143                         bpm
 Cardiac Activity:  Observed
 Presentation:      Cephalic
Gestational Age

 LMP:           25w 0d       Date:   12/24/11                 EDD:   09/29/12
 Best:          25w 0d    Det. By:   LMP  (12/24/11)          EDD:   09/29/12
Cervix Uterus Adnexa

 Cervical Length:   2.8       cm

 Cervix:       Cerclage visualized. Measured transvaginally.
Impression

 Single living IUP with assigned GA of 25w 0d in cephalic
 position.
 The closed cervical length is 2.8 cm. The cerclage is
 visualized. No dynamic changes seen.

 LACH with us.  Please do not hesitate to

## 2014-06-04 ENCOUNTER — Encounter: Payer: Self-pay | Admitting: Obstetrics & Gynecology

## 2015-09-23 ENCOUNTER — Ambulatory Visit (INDEPENDENT_AMBULATORY_CARE_PROVIDER_SITE_OTHER): Payer: Self-pay | Admitting: Family Medicine

## 2015-09-23 ENCOUNTER — Encounter: Payer: Self-pay | Admitting: Family Medicine

## 2015-09-23 VITALS — BP 112/75 | HR 79 | Temp 98.5°F | Wt 167.2 lb

## 2015-09-23 DIAGNOSIS — O3680X1 Pregnancy with inconclusive fetal viability, fetus 1: Secondary | ICD-10-CM

## 2015-09-23 DIAGNOSIS — O09521 Supervision of elderly multigravida, first trimester: Secondary | ICD-10-CM

## 2015-09-23 DIAGNOSIS — O0991 Supervision of high risk pregnancy, unspecified, first trimester: Secondary | ICD-10-CM

## 2015-09-23 DIAGNOSIS — L298 Other pruritus: Secondary | ICD-10-CM

## 2015-09-23 DIAGNOSIS — O3430 Maternal care for cervical incompetence, unspecified trimester: Secondary | ICD-10-CM | POA: Insufficient documentation

## 2015-09-23 DIAGNOSIS — O0990 Supervision of high risk pregnancy, unspecified, unspecified trimester: Secondary | ICD-10-CM | POA: Insufficient documentation

## 2015-09-23 DIAGNOSIS — Z113 Encounter for screening for infections with a predominantly sexual mode of transmission: Secondary | ICD-10-CM

## 2015-09-23 DIAGNOSIS — Z124 Encounter for screening for malignant neoplasm of cervix: Secondary | ICD-10-CM

## 2015-09-23 DIAGNOSIS — O34219 Maternal care for unspecified type scar from previous cesarean delivery: Secondary | ICD-10-CM

## 2015-09-23 DIAGNOSIS — Z23 Encounter for immunization: Secondary | ICD-10-CM

## 2015-09-23 DIAGNOSIS — Z1151 Encounter for screening for human papillomavirus (HPV): Secondary | ICD-10-CM

## 2015-09-23 DIAGNOSIS — O3680X0 Pregnancy with inconclusive fetal viability, not applicable or unspecified: Secondary | ICD-10-CM

## 2015-09-23 DIAGNOSIS — O09529 Supervision of elderly multigravida, unspecified trimester: Secondary | ICD-10-CM | POA: Insufficient documentation

## 2015-09-23 DIAGNOSIS — N898 Other specified noninflammatory disorders of vagina: Secondary | ICD-10-CM

## 2015-09-23 DIAGNOSIS — O3431 Maternal care for cervical incompetence, first trimester: Secondary | ICD-10-CM

## 2015-09-23 DIAGNOSIS — Z98891 History of uterine scar from previous surgery: Secondary | ICD-10-CM | POA: Insufficient documentation

## 2015-09-23 LAB — POCT URINALYSIS DIP (DEVICE)
Bilirubin Urine: NEGATIVE
Glucose, UA: NEGATIVE mg/dL
Ketones, ur: NEGATIVE mg/dL
NITRITE: NEGATIVE
PH: 5.5 (ref 5.0–8.0)
Protein, ur: NEGATIVE mg/dL
SPECIFIC GRAVITY, URINE: 1.02 (ref 1.005–1.030)
Urobilinogen, UA: 0.2 mg/dL (ref 0.0–1.0)

## 2015-09-23 MED ORDER — TERCONAZOLE 0.4 % VA CREA: 1.0000 | TOPICAL_CREAM | Freq: Every day | VAGINAL | Status: DC

## 2015-09-23 MED ORDER — HYDROXYPROGESTERONE CAPROATE 250 MG/ML IM OIL: 250.0000 mg | TOPICAL_OIL | Freq: Once | INTRAMUSCULAR | Status: DC

## 2015-09-23 NOTE — Progress Notes (Signed)
Initial prenatal info packet given Need initial prenatal labs today Flu vaccine today

## 2015-09-23 NOTE — Progress Notes (Signed)
Subjective:    Teresa Day is a [id number] [redacted]w[redacted]d being seen today for her first obstetrical visit.  Her obstetrical history is significant for advanced maternal age and incompetent cervix and prior c-section x 3. Patient does intend to breast feed. Pregnancy history fully reviewed.  Patient reports no complaints.  Filed Vitals:   09/23/15 1054  BP: 112/75  Pulse: 79  Temp: 98.5 F (36.9 C)  Weight: 167 lb 3.2 oz (75.841 kg)    HISTORY: OB History  Gravida Para Term Preterm AB SAB TAB Ectopic Multiple Living  0 0 0 3    # Outcome Date GA Lbr Len/2nd Weight Sex Delivery Anes PTL Lv  6 Current           5 Term 09/22/12 [redacted]w[redacted]d  7 lb 6 oz (3.345 kg) F CS-LTranv Spinal  Y  4 AB 2011 [redacted]w[redacted]d    Vag-Spont  Y FD     Complications: Preterm premature rupture of membranes (PPROM) delivered, current hospitalization  3 Preterm 2010 [redacted]w[redacted]d      Y ND     Complications: Preterm premature rupture of membranes (PPROM) delivered, current hospitalization  2 Term 03/10/07 [redacted]w[redacted]d  6 lb 11 oz (3.033 kg) F CS-Unspec Spinal N Y  1 Term 05/09/00 [redacted]w[redacted]d  8 lb 11 oz (3.941 kg) M CS-Unspec Spinal N Y     Past Medical History  Diagnosis Date  . Medical history non-contributory   . Preterm labor    Past Surgical History  Procedure Laterality Date  . Cesarean section    . Cervical cerclage  04/01/2012    Procedure: CERCLAGE CERVICAL;  Surgeon: Reva Bores, MD;  Location: [facility];  Service: Gynecology;  Laterality: N/A;  . Cesarean section N/A 09/22/2012    Procedure: CESAREAN SECTION;  Surgeon: Adam Phenix, MD;  Location: [facility];  Service: Obstetrics;  Laterality: N/A;  Repeat  . Cervical cerclage N/A 09/22/2012    Procedure: CERCLAGE CERVICAL REMOVAL;  Surgeon: Adam Phenix, MD;  Location: [facility];  Service: Obstetrics;  Laterality: N/A;   Family History  Problem Relation Age of Onset  . Other Neg Hx   . Hypertension Mother   . Hypertension Father      Exam    Uterus:    12 wk size  Pelvic Exam:    Perineum: Normal Perineum   Vulva: Bartholin's, Urethra, Skene's normal, female escutcheon   Vagina:  normal mucosa, normal discharge   Cervix: multiparous appearance and no bleeding following Pap   Adnexa: normal adnexa   Bony Pelvis: average  System: Breast:  normal appearance, no masses or tenderness   Skin: normal coloration and turgor, no rashes    Neurologic: oriented   Extremities: normal strength, tone, and muscle mass   HEENT extra ocular movement intact and sclera clear, anicteric   Mouth/Teeth mucous membranes moist, pharynx normal without lesions and dental hygiene good   Neck supple   Cardiovascular: regular rate and rhythm, no murmurs or gallops   Respiratory:  appears well, vitals normal, no respiratory distress, acyanotic, normal RR, ear and throat exam is normal, neck free of mass or lymphadenopathy, chest clear, no wheezing, crepitations, rhonchi, normal symmetric air entry   Abdomen: soft, non-tender; bowel sounds normal; no masses,  no organomegaly      Assessment/Plan:   1. Supervision of high risk pregnancy, antepartum, first trimester Labs Flu shot - POCT urinalysis dip (device) - Culture, OB Urine - Prenatal  Profile - Cytology - PAP - Hemoglobinopathy evaluation - Cystic fibrosis diagnostic study - Prescript Monitor Profile(19) - Flu Vaccine QUAD 36+ mos IM 2. Previous cesarean delivery, antepartum Will need repeat  3. Incompetent cervix in pregnancy, antepartum, first trimester Order Makena Book for cervical cerclage--message sent to scheduler - hydroxyprogesterone caproate (MAKENA) 250 mg/mL OIL injection; Inject 1 mL (250 mg total) into the muscle once.  Dispense: 0.98 mL; Refill: 5  4. Vagina itching  - Wet prep, genital - terconazole (TERAZOL 7) 0.4 % vaginal cream; Place 1 applicator vaginally at bedtime.  Dispense: 45 g; Refill: 0  5. AMA  -AMB referral for genetic counseling  Timothy Townsel S 09/23/2015

## 2015-09-23 NOTE — Addendum Note (Signed)
Addended by: Jill Side on: 09/23/2015 04:20 PM   Modules accepted: Orders

## 2015-09-23 NOTE — Patient Instructions (Signed)
Primer trimestre de embarazo (First Trimester of Pregnancy) El primer trimestre de embarazo se extiende desde la semana1 hasta el final de la semana12 (mes1 al mes3). Una semana despus de que un espermatozoide fecunda un vulo, este se implantar en la pared uterina. Este embrin comenzar a desarrollarse hasta convertirse en un beb. Sus genes y los de su pareja forman el beb. Los genes del varn determinan si ser un nio o una nia. Entre la semana6 y la8, se forman los ojos y el rostro, y los latidos del corazn pueden verse en la ecografa. Al final de las 12semanas, todos los rganos del beb estn formados.  Ahora que est embarazada, querr hacer todo lo que est a su alcance para tener un beb sano. Dos de las cosas ms importantes son tener una buena atencin prenatal y seguir las indicaciones del mdico. La atencin prenatal incluye toda la asistencia mdica que usted recibe antes del nacimiento del beb. Esta ayudar a prevenir, detectar y tratar cualquier problema durante el embarazo y el parto. CAMBIOS EN EL ORGANISMO Su organismo atraviesa por muchos cambios durante el embarazo, y estos varan de una mujer a otra.   Al principio, puede aumentar o bajar algunos kilos.  Puede tener malestar estomacal (nuseas) y vomitar. Si no puede controlar los vmitos, llame al mdico.  Puede cansarse con facilidad.  Es posible que tenga dolores de cabeza que pueden aliviarse con los medicamentos que el mdico le permita tomar.  Puede orinar con mayor frecuencia. El dolor al orinar puede significar que usted tiene una infeccin de la vejiga.  Debido al embarazo, puede tener acidez estomacal.  Puede estar estreida, ya que ciertas hormonas enlentecen los movimientos de los msculos que empujan los desechos a travs de los intestinos.  Pueden aparecer hemorroides o abultarse e hincharse las venas (venas varicosas).  Las mamas pueden empezar a agrandarse y estar sensibles. Los pezones  pueden sobresalir ms, y el tejido que los rodea (areola) tornarse ms oscuro.  Las encas pueden sangrar y estar sensibles al cepillado y al hilo dental.  Pueden aparecer zonas oscuras o manchas (cloasma, mscara del embarazo) en el rostro que probablemente se atenuarn despus del nacimiento del beb.  Los perodos menstruales se interrumpirn.  Tal vez no tenga apetito.  Puede sentir un fuerte deseo de consumir ciertos alimentos.  Puede tener cambios a nivel emocional da a da, por ejemplo, por momentos puede estar emocionada por el embarazo y por otros preocuparse porque algo pueda salir mal con el embarazo o el beb.  Tendr sueos ms vvidos y extraos.  Tal vez haya cambios en el cabello que pueden incluir su engrosamiento, crecimiento rpido y cambios en la textura. A algunas mujeres tambin se les cae el cabello durante o despus del embarazo, o tienen el cabello seco o fino. Lo ms probable es que el cabello se le normalice despus del nacimiento del beb. QU DEBE ESPERAR EN LAS CONSULTAS PRENATALES Durante una visita prenatal de rutina:  La pesarn para asegurarse de que usted y el beb estn creciendo normalmente.  Le controlarn la presin arterial.  Le medirn el abdomen para controlar el desarrollo del beb.  Se escucharn los latidos cardacos a partir de la semana10 o la12 de embarazo, aproximadamente.  Se analizarn los resultados de los estudios solicitados en visitas anteriores. El mdico puede preguntarle:  Cmo se siente.  Si siente los movimientos del beb.  Si ha tenido sntomas anormales, como prdida de lquido, sangrado, dolores de cabeza intensos o   médico puede preguntarle:  · Cómo se siente.  · Si siente los movimientos del bebé.  · Si ha tenido síntomas anormales, como pérdida de líquido, sangrado, dolores de cabeza intensos o cólicos abdominales.  · Si está consumiendo algún producto que contenga tabaco, como cigarrillos, tabaco de mascar y cigarrillos electrónicos.  · Si tiene alguna pregunta.  Otros estudios que pueden realizarse durante el primer trimestre incluyen lo siguiente:  · Análisis de sangre para determinar el tipo  de sangre y detectar la presencia de infecciones previas. Además, se los usará para controlar si los niveles de hierro son bajos (anemia) y determinar los anticuerpos Rh. En una etapa más avanzada del embarazo, se harán análisis de sangre para saber si tiene diabetes, junto con otros estudios si surgen problemas.  · Análisis de orina para detectar infecciones, diabetes o proteínas en la orina.  · Una ecografía para confirmar que el bebé crece y se desarrolla correctamente.  · Una amniocentesis para diagnosticar posibles problemas genéticos.  · Estudios del feto para descartar espina bífida y síndrome de Down.  · Es posible que necesite otras pruebas adicionales.  · Prueba del VIH (virus de inmunodeficiencia humana). Los exámenes prenatales de rutina incluyen la prueba de detección del VIH, a menos que decida no realizársela.  INSTRUCCIONES PARA EL CUIDADO EN EL HOGAR   Medicamentos:  · Siga las indicaciones del médico en relación con el uso de medicamentos. Durante el embarazo, hay medicamentos que pueden tomarse y otros que no.  · Tome las vitaminas prenatales como se le indicó.  · Si está estreñida, tome un laxante suave, si el médico lo autoriza.  Dieta  · Consuma alimentos balanceados. Elija alimentos variados, como carne o proteínas de origen vegetal, pescado, leche y productos lácteos descremados, verduras, frutas y panes y cereales integrales. El médico la ayudará a determinar la cantidad de peso que puede aumentar.  · No coma carne cruda ni quesos sin cocinar. Estos elementos contienen bacterias que pueden causar defectos congénitos en el bebé.  · La ingesta diaria de cuatro o cinco comidas pequeñas en lugar de tres comidas abundantes puede ayudar a aliviar las náuseas y los vómitos. Si empieza a tener náuseas, comer algunas galletas saladas puede ser de ayuda. Beber líquidos entre las comidas en lugar de tomarlos durante las comidas también puede ayudar a calmar las náuseas y los vómitos.  · Si está  estreñida, consuma alimentos con alto contenido de fibra, como verduras y frutas frescas, y cereales integrales. Beba suficiente líquido para mantener la orina clara o de color amarillo pálido.  Actividad y ejercicios  · Haga ejercicio solamente como se lo haya indicado el médico. El ejercicio la ayudará a:    Controlar el peso.    Mantenerse en forma.    Estar preparada para el trabajo de parto y el parto.  · Los dolores, los cólicos en la parte baja del abdomen o los calambres en la cintura son un buen indicio de que debe dejar de hacer ejercicios. Consulte al médico antes de seguir haciendo ejercicios normales.  · Intente no estar de pie durante mucho tiempo. Mueva las piernas con frecuencia si debe estar de pie en un lugar durante mucho tiempo.  · Evite levantar pesos excesivos.  · Use zapatos de tacones bajos y mantenga una buena postura.  · Puede seguir teniendo relaciones sexuales, excepto que el médico le indique lo contrario.  Alivio del dolor o las molestias  · Use un sostén que le brinde buen   3 o 4veces por da. Limite la cantidad de sal en su dieta. Cuidados prenatales  Programe las visitas prenatales para la semana12 de [place]. Generalmente se programan cada mes al principio y se hacen ms frecuentes en los 2 ltimos meses antes del parto.  Escriba sus preguntas. Llvelas cuando concurra a las visitas prenatales.  Concurra a todas las visitas prenatales como se lo haya indicado el mdico. Seguridad  Colquese el cinturn de seguridad cuando conduzca.  Haga una lista de los nmeros de telfono de  Associate Professor, que [employer] nmeros de telfono de familiares, [place], el hospital y los departamentos de polica y bomberos. Consejos generales  Pdale al mdico que la derive a clases de educacin prenatal en su localidad. Debe comenzar a tomar las clases antes de Cytogeneticist en el mes6 de embarazo.  Pida ayuda si tiene necesidades nutricionales o de asesoramiento Academic librarian. El mdico puede aconsejarla o derivarla a especialistas para que la ayuden con diferentes necesidades.  No se d baos de inmersin en agua caliente, baos turcos ni saunas.  No se haga duchas vaginales ni use tampones o toallas higinicas perfumadas.  No mantenga las piernas cruzadas durante [place].  Evite el contacto con las bandejas sanitarias de los gatos y la tierra que estos animales usan. Estos elementos contienen bacterias que pueden causar defectos congnitos al beb y la posible prdida del feto debido a un aborto espontneo o muerte fetal.  No fume, no consuma hierbas ni medicamentos que no hayan sido recetados por el mdico. Las sustancias qumicas que estos productos contienen afectan la formacin y el desarrollo del beb.  No consuma ningn producto que contenga tabaco, lo que incluye cigarrillos, tabaco de Theatre manager y Administrator, Civil Service. Si necesita ayuda para dejar de fumar, consulte al [employer]. Puede recibir asesoramiento y otro tipo de recursos para dejar de fumar.  Programe una cita con el dentista. En su casa, lvese los dientes con un cepillo dental blando y psese el hilo dental con suavidad. SOLICITE ATENCIN MDICA SI:   Tiene mareos.  Siente clicos leves, presin en la pelvis o dolor persistente en el abdomen.  Tiene nuseas, vmitos o diarrea persistentes.  Tiene secrecin vaginal con mal olor.  Siente dolor al [employer].  Tiene el rostro, las [place], las piernas o los tobillos ms hinchados. SOLICITE ATENCIN MDICA DE INMEDIATO SI:   Tiene fiebre.  Tiene una prdida de  lquido por la vagina.  Tiene sangrado o pequeas prdidas vaginales.  Siente dolor intenso o clicos en el abdomen.  Sube o baja de peso rpidamente.  Vomita sangre de color rojo brillante o material que parezca granos de caf.  Ha estado expuesta a la rubola y no ha sufrido la enfermedad.  Ha estado expuesta a la quinta enfermedad o a la varicela.  Tiene un dolor de cabeza intenso.  Le falta el aire.  Sufre cualquier tipo de traumatismo, por ejemplo, debido a una cada o un accidente automovilstico.   Esta informacin no tiene Theme park manager el consejo del mdico. Asegrese de hacerle al mdico cualquier pregunta que tenga.   Document Released: 04/29/2005 Document Revised: 08/10/2014 Elsevier Interactive Patient Education 2016 [employer].  [place] materna (Breastfeeding) Decidir Museum/gallery exhibitions officer es una de las mejores elecciones que puede hacer por usted y su beb. El cambio hormonal durante el Psychiatrist produce el desarrollo del tejido mamario y [place] la cantidad y el tamao de los conductos galactforos. Estas hormonas tambin [employer] Corporation, los azcares y las grasas de  la sangre produzcan la [employer] materna en las glndulas productoras de [place]. Las hormonas impiden que la leche materna sea liberada antes del nacimiento del beb, adems de impulsar el flujo de leche luego del nacimiento. Una vez que ha comenzado a Museum/gallery exhibitions officer, Conservation officer, nature beb, as Immunologist succin o Theatre manager, pueden estimular la liberacin de [place] de las glndulas productoras de [place].  LOS BENEFICIOS DE AMAMANTAR Para el beb  La primera leche (calostro) ayuda a Careers information officer funcionamiento del sistema digestivo del beb.  La leche tiene anticuerpos que ayudan a Radio producer las infecciones en el beb.  El beb tiene una menor incidencia de asma, alergias y del sndrome de muerte sbita del lactante.  Los nutrientes en la [place] materna son mejores para el beb que la [place] maternizada y estn  preparados exclusivamente para cubrir las necesidades del beb.  La leche materna mejora el desarrollo cerebral del beb.  Es menos probable que el beb desarrolle otras enfermedades, como obesidad infantil, asma o diabetes mellitus de tipo 2. Para usted   La lactancia materna favorece el desarrollo de un vnculo muy especial entre la madre y el beb.  Es conveniente. La leche materna siempre est disponible a la Human resources officer y es [place].  La lactancia materna ayuda a quemar caloras y a perder el peso ganado durante el [place].  Favorece la contraccin del tero al tamao que tena antes del embarazo de manera ms rpida y disminuye el sangrado (loquios) despus del parto.  La lactancia materna contribuye a reducir Nurse, adult de desarrollar diabetes mellitus de tipo 2, osteoporosis o cncer de mama o de ovario en el futuro. SIGNOS DE QUE EL BEB EST HAMBRIENTO Primeros signos de [address] de [place].  Se estira.  Mueve la cabeza de un lado a otro.  Mueve la cabeza y abre la boca cuando se le toca la mejilla o la comisura de la boca (reflejo de bsqueda).  Aumenta las vocalizaciones, tales como sonidos de succin, se relame los labios, emite arrullos, suspiros, o chirridos.  Mueve la [employer] boca.  Se chupa con ganas los dedos o las manos. Signos tardos de [employer].  Llora de manera intermitente. Signos de [employer] Corporation signos de hambre extrema requerirn que lo calme y lo consuele antes de que el beb pueda alimentarse adecuadamente. No espere a que se manifiesten los siguientes signos de hambre extrema para comenzar a Museum/gallery exhibitions officer:   Designer, jewellery.  Llanto intenso y fuerte.  Gritos. INFORMACIN BSICA SOBRE LA LACTANCIA MATERNA Iniciacin de la lactancia materna  Encuentre un lugar cmodo para sentarse o acostarse, con un buen respaldo para el cuello y la espalda.  Coloque una almohada o una manta enrollada  debajo del beb para acomodarlo a la altura de la mama (si est sentada). Las almohadas para Museum/gallery exhibitions officer se han diseado especialmente a fin de servir de apoyo para los brazos y el beb [employer].  Asegrese de que el abdomen del beb est frente al suyo.   Masajee suavemente la mama. Con las yemas de los dedos, masajee la pared del pecho hacia el pezn en un movimiento circular. Esto estimula el flujo de [place]. Es posible que Engineer, manufacturing systems este movimiento mientras amamanta si la leche fluye lentamente.  Sostenga la mama con el pulgar por arriba del pezn y los otros 4 dedos por debajo de la mama. Asegrese de que los dedos se encuentren lejos del pezn y de la  boca del beb.  Empuje suavemente los labios del beb con el pezn o con el dedo.  Cuando la boca del beb se abra lo suficiente, acrquelo rpidamente a la mama e introduzca todo el pezn y la zona oscura que lo rodea (areola), tanto como sea posible, dentro de la boca del beb.  Debe haber ms areola visible por arriba del labio superior del beb que por debajo del labio inferior.  La lengua del beb debe estar entre la enca inferior y la [place].  Asegrese de que la boca del beb est en la posicin correcta alrededor del pezn (prendida). Los labios del beb deben crear un sello sobre la mama y estar doblados hacia afuera (invertidos).  Es comn que el beb succione durante 2 a 3 minutos para que comience el flujo de [place]. Cmo debe prenderse Es muy importante que le ensee al beb cmo prenderse adecuadamente a la mama. Si el beb no se prende adecuadamente, puede causarle dolor en el pezn y reducir la produccin de [place], y hacer que el beb tenga un escaso aumento de [place]. Adems, si el beb no se prende adecuadamente al pezn, puede tragar aire durante la alimentacin. Esto puede causarle molestias al beb. Hacer eructar al beb al [place] de mama puede ayudarlo a liberar el aire. Sin embargo, ensearle al  beb cmo prenderse a la mama adecuadamente es la mejor manera de evitar que se sienta molesto por tragar Oceanographer se alimenta. Signos de que el beb se ha prendido adecuadamente al pezn:   Payton Doughty o succiona de modo silencioso, sin causarle dolor.  Se escucha que traga cada 3 o 4 succiones.  Hay movimientos musculares por arriba y por delante de sus odos al Printmaker. Signos de que el beb no se ha prendido Audiological scientist al pezn:   Hace ruidos de succin o de chasquido mientras se alimenta.  Siente dolor en el pezn. Si cree que el beb no se prendi correctamente, deslice el dedo en la comisura de la boca y [employer] las encas del beb para interrumpir la succin. Intente comenzar a amamantar nuevamente. Signos de Fish farm manager Signos del beb:   Disminuye gradualmente el nmero de succiones o cesa la succin por completo.  Se duerme.  Relaja el cuerpo.  Retiene una pequea cantidad de [facility] boca.  Se desprende solo del pecho. Signos que presenta usted:  Las mamas han aumentado la firmeza, el peso y el tamao 1 a 3 horas despus de Museum/gallery exhibitions officer.  Estn ms blandas inmediatamente despus de amamantar.  Un aumento del volumen de [place], y tambin un cambio en su consistencia y color se producen hacia el quinto da de Tour manager.  Los pezones no duelen, ni estn agrietados ni sangran. Signos de que su beb recibe la cantidad de leche suficiente  Moja al menos 3 paales en 24 horas. La orina debe ser clara y de color amarillo plido a los 5 [address]  [address] de [place].  Defeca al menos 3 veces en 24 horas a los 5 [address]  [address] de [address] La materia fecal debe ser blanda y [place].  Defeca al menos 3 veces en 24 horas a los [address] de [address] La materia fecal debe ser grumosa y [employer].  No registra una prdida de peso mayor del 10% del peso al nacer durante los primeros 3 [address] de [place].  Aumenta de peso un promedio de 4 a 7onzas (113 a 198g) por semana  despus de los 4 [address] de  vidaLenora Boys de peso, diariamente, de manera uniforme a partir de los 5 [address] de vida, sin Passenger transport manager prdida de peso despus de las 2semanas de vida. Despus de alimentarse, es posible que el beb regurgite una pequea cantidad. Esto es frecuente. FRECUENCIA Y DURACIN DE LA LACTANCIA MATERNA El amamantamiento frecuente la ayudar a producir ms [place] y a Education officer, community de Engineer, mining en los pezones e hinchazn en las [place]. Alimente al beb cuando muestre signos de hambre o si siente la necesidad de reducir la congestin de las [place]. Esto se denomina "lactancia a demanda". Evite el uso del chupete mientras trabaja para establecer la lactancia (las primeras 4 a 6 semanas despus del nacimiento del beb). Despus de este perodo, podr ofrecerle un chupete. Las investigaciones demostraron que el uso del chupete durante el primer ao de vida del beb disminuye el riesgo de desarrollar el sndrome de muerte sbita del lactante (SMSL). Permita que el nio se alimente en cada mama todo lo que desee. Contine amamantando al beb hasta que haya terminado de alimentarse. Cuando el beb se desprende o se queda dormido mientras se est alimentando de la primera mama, ofrzcale la segunda. Debido a que, con frecuencia, los recin [place] las primeras semanas de vida, es posible que deba despertar al beb para alimentarlo. Los horarios de Acupuncturist de un beb a otro. Sin embargo, las siguientes reglas pueden servir como gua para ayudarla a Lawyer que el beb se alimenta adecuadamente:  Se puede amamantar a los recin nacidos (bebs de 4 semanas o menos de vida) cada 1 a 3 horas.  No deben transcurrir ms de 3 horas durante el da o 5 horas durante la noche sin que se amamante a los recin nacidos.  Debe amamantar al beb 8 veces como mnimo en un perodo de 24 horas, hasta que comience a introducir slidos en su dieta, a los 6 meses de vida  aproximadamente. EXTRACCIN DE [employer] MATERNA La extraccin y Contractor de la leche materna le permiten asegurarse de que el beb se alimente exclusivamente de [place], aun en momentos en los que no puede amamantar. Esto tiene especial importancia si debe regresar al Aleen Campi en el perodo en que an est amamantando o si no puede estar presente en los momentos en que el beb debe alimentarse. Su asesor en lactancia puede orientarla sobre cunto tiempo es seguro almacenar [place].  El sacaleche es un aparato que le permite extraer leche de la mama a un recipiente estril. Luego, la leche materna extrada puede almacenarse en un refrigerador o Electrical engineer. Algunos sacaleches son Birdie Riddle, Delaney Meigs otros son elctricos. Consulte a su asesor en lactancia qu tipo ser ms conveniente para usted. Los sacaleches se pueden comprar; sin embargo, algunos hospitales y grupos de apoyo a la lactancia materna alquilan Sports coach. Un asesor en lactancia puede ensearle cmo extraer [employer], en caso de que prefiera no usar un sacaleche.  CMO CUIDAR LAS MAMAS DURANTE LA LACTANCIA MATERNA Los pezones se secan, agrietan y duelen durante la Tour manager. Las siguientes recomendaciones pueden ayudarla a Pharmacologist las [employer] y sanas:  Careers information officer usar jabn en los pezones.  Use un sostn de soporte. Aunque no son esenciales, las camisetas sin mangas o los sostenes especiales para Museum/gallery exhibitions officer estn diseados para acceder fcilmente a las mamas, para Museum/gallery exhibitions officer sin tener que quitarse todo el sostn o la camiseta. Evite usar sostenes con aro o sostenes muy ajustados.  Seque al aire sus pezones durante  3 a despus de amamantar al beb.  Utilice solo apsitos de Haematologist sostn para Environmental health practitioner las prdidas de [place]. La prdida de un poco de [employer] tomas es normal.  Utilice lanolina sobre los pezones luego de Museum/gallery exhibitions officer. La lanolina ayuda a  mantener la humedad normal de la piel. Si [place] lanolina pura, no tiene que lavarse los pezones antes de volver a Corporate treasurer al beb. La lanolina pura no es txica para el beb. Adems, puede extraer [employer] algunas gotas de [place] materna y Engineer, maintenance (IT) suavemente esa [employer], para que la [place] se seque al aire. Durante las primeras semanas despus de dar a luz, algunas mujeres pueden experimentar hinchazn en las mamas (congestin [place]). La congestin puede hacer que sienta las mamas pesadas, calientes y sensibles al tacto. El pico de la congestin ocurre dentro de los 3 a 5 das despus del [place]. Las siguientes recomendaciones pueden ayudarla a Paramedic la congestin:  Vace por completo las mamas al [employer] o Environmental health practitioner. Puede aplicar calor hmedo en las mamas (en la ducha o con toallas hmedas para manos) antes de Museum/gallery exhibitions officer o extraer [employer] Resources. Esto aumenta la circulacin y [place] a que la [place]. Si el beb no vaca por completo las [address] cuando lo [address], extraiga la [place] restante despus de que haya finalizado.  Use un sostn ajustado (para amamantar o comn) o una camiseta sin mangas durante 1 o 2 das para indicar al cuerpo que disminuya ligeramente la produccin de [place].  Aplique compresas de hielo [employer], a menos que le resulte demasiado incmodo.  Asegrese de que el beb est prendido y se encuentre en la posicin correcta mientras lo alimenta. Si la congestin persiste luego de 48 horas o despus de seguir estas recomendaciones, comunquese con su mdico o un Holiday representative. RECOMENDACIONES GENERALES PARA EL CUIDADO DE LA SALUD DURANTE LA LACTANCIA MATERNA  Consuma alimentos saludables. Alterne comidas y colaciones, y coma 3 de cada una por da. Dado que lo que come [employer], es posible que algunas comidas hagan que su beb se vuelva ms irritable de lo habitual. Evite comer este tipo de alimentos si percibe que afectan de manera  negativa al beb.  Beba leche, jugos de fruta y agua para Patent examiner su sed (aproximadamente 10 vasos al Futures trader).  Descanse con frecuencia, reljese y tome sus vitaminas prenatales para evitar la fatiga, el estrs y la anemia.  Contine con los autocontroles de la mama.  Evite Product manager y fumar tabaco. Las sustancias qumicas de los cigarrillos que pasan a la leche materna y la exposicin al humo ambiental del tabaco pueden daar al beb.  No consuma alcohol ni drogas, incluida la marihuana. Algunos medicamentos, que pueden ser perjudiciales para el beb, pueden pasar a travs de la [employer]. Es importante que consulte a su mdico antes de Medical sales representative, incluidos todos los medicamentos recetados y de Excello, as como los suplementos vitamnicos y herbales. Puede quedar embarazada durante la lactancia. Si desea controlar la natalidad, consulte a su mdico cules son las opciones ms seguras para el beb. SOLICITE ATENCIN MDICA SI:   Usted siente que quiere dejar de Museum/gallery exhibitions officer o se siente frustrada con la lactancia.  Siente dolor en las mamas o en los pezones.  Sus pezones estn agrietados o Water quality scientist.  Sus pechos estn irritados, sensibles o calientes.  Tiene un rea hinchada en cualquiera de las mamas.  Siente escalofros o fiebre.  Tiene  nuseas o vmitos.  Presenta una secrecin de otro lquido distinto de la leche materna de los pezones.  Sus mamas no se llenan antes de Museum/gallery exhibitions officer al beb para el quinto da despus del [place].  Se siente triste y deprimida.  El beb est demasiado somnoliento como para comer bien.  El beb tiene problemas para dormir.  Moja menos de 3 paales en 24 horas.  Defeca menos de 3 veces en 24 horas.  La piel del beb o la parte blanca de los ojos se vuelven amarillentas.  El beb no ha aumentado de [place] a los [address] de [place]. SOLICITE ATENCIN MDICA DE INMEDIATO SI:   El beb est muy cansado Retail buyer) y no se quiere  despertar para comer.  Le sube la fiebre sin causa.   Esta informacin no tiene Theme park manager el consejo del mdico. Asegrese de hacerle al mdico cualquier pregunta que tenga.   Document Released: 07/20/2005 Document Revised: 04/10/2015 Elsevier Interactive Patient Education [employer]

## 2015-09-23 NOTE — Progress Notes (Addendum)
Bedside US for viability = Single IUP;  FHR @ 158 bpm per PW doppler. FM present.

## 2015-09-24 ENCOUNTER — Ambulatory Visit (HOSPITAL_COMMUNITY): Payer: Self-pay

## 2015-09-24 ENCOUNTER — Encounter (HOSPITAL_COMMUNITY): Payer: Self-pay | Admitting: *Deleted

## 2015-09-24 LAB — PRENATAL PROFILE (SOLSTAS)
Antibody Screen: NEGATIVE
BASOS PCT: 0 % (ref 0–1)
Basophils Absolute: 0 10*3/uL (ref 0.0–0.1)
Eosinophils Absolute: 0.1 10*3/uL (ref 0.0–0.7)
Eosinophils Relative: 1 % (ref 0–5)
HCT: 37 % (ref 36.0–46.0)
HEMOGLOBIN: 13 g/dL (ref 12.0–15.0)
HEP B S AG: NEGATIVE
HIV: NONREACTIVE
LYMPHS ABS: 2.3 10*3/uL (ref 0.7–4.0)
LYMPHS PCT: 24 % (ref 12–46)
MCH: 31 pg (ref 26.0–34.0)
MCHC: 35.1 g/dL (ref 30.0–36.0)
MCV: 88.3 fL (ref 78.0–100.0)
MONO ABS: 0.4 10*3/uL (ref 0.1–1.0)
MONOS PCT: 4 % (ref 3–12)
MPV: 9.5 fL (ref 8.6–12.4)
NEUTROS ABS: 6.8 10*3/uL (ref 1.7–7.7)
NEUTROS PCT: 71 % (ref 43–77)
Platelets: 272 10*3/uL (ref 150–400)
RBC: 4.19 MIL/uL (ref 3.87–5.11)
RDW: 12.4 % (ref 11.5–15.5)
RUBELLA: 4.54 {index} — AB (ref <0.90)
Rh Type: POSITIVE
WBC: 9.6 10*3/uL (ref 4.0–10.5)

## 2015-09-24 LAB — WET PREP, GENITAL: Trich, Wet Prep: NONE SEEN

## 2015-09-24 LAB — GC/CHLAMYDIA PROBE AMP (~~LOC~~) NOT AT ARMC
Chlamydia: NEGATIVE
NEISSERIA GONORRHEA: NEGATIVE

## 2015-09-24 LAB — CYSTIC FIBROSIS DIAGNOSTIC STUDY

## 2015-09-25 ENCOUNTER — Ambulatory Visit (HOSPITAL_COMMUNITY)
Admission: RE | Admit: 2015-09-25 | Discharge: 2015-09-25 | Disposition: A | Discharge status: Home / Self Care | Payer: Self-pay | Source: Ambulatory Visit | Attending: Family Medicine | Admitting: Family Medicine

## 2015-09-25 DIAGNOSIS — O09521 Supervision of elderly multigravida, first trimester: Secondary | ICD-10-CM

## 2015-09-25 DIAGNOSIS — O09529 Supervision of elderly multigravida, unspecified trimester: Secondary | ICD-10-CM | POA: Insufficient documentation

## 2015-09-25 DIAGNOSIS — Z3A11 11 weeks gestation of pregnancy: Secondary | ICD-10-CM | POA: Insufficient documentation

## 2015-09-25 LAB — PRESCRIPTION MONITORING PROFILE (19 PANEL)
AMPHETAMINE/METH: NEGATIVE ng/mL
Barbiturate Screen, Urine: NEGATIVE ng/mL
Benzodiazepine Screen, Urine: NEGATIVE ng/mL
Buprenorphine, Urine: NEGATIVE ng/mL
CARISOPRODOL, URINE: NEGATIVE ng/mL
CREATININE, URINE: 123.91 mg/dL (ref >20.0)
Cannabinoid Scrn, Ur: NEGATIVE ng/mL
Cocaine Metabolites: NEGATIVE ng/mL
ECSTASY: NEGATIVE ng/mL
FENTANYL URINE: NEGATIVE ng/mL
MEPERIDINE UR: NEGATIVE ng/mL
METHADONE SCREEN, URINE: NEGATIVE ng/mL
METHAQUALONE SCREEN (URINE): NEGATIVE ng/mL
NITRITES URINE, INITIAL: NEGATIVE ug/mL
OXYCODONE SCRN UR: NEGATIVE ng/mL
Opiate Screen, Urine: NEGATIVE ng/mL
PROPOXYPHENE: NEGATIVE ng/mL
Phencyclidine, Ur: NEGATIVE ng/mL
TAPENTADOLUR: NEGATIVE ng/mL
TRAMADOL UR: NEGATIVE ng/mL
Zolpidem, Urine: NEGATIVE ng/mL
pH, Initial: 5.4 pH (ref 4.5–8.9)

## 2015-09-25 LAB — HEMOGLOBINOPATHY EVALUATION
HEMOGLOBIN OTHER: 0 %
HGB A2 QUANT: 2.7 % (ref 2.2–3.2)
HGB A: 97.3 % (ref 96.8–97.8)
Hgb F Quant: 0 % (ref 0.0–2.0)
Hgb S Quant: 0 %

## 2015-09-25 LAB — CULTURE, OB URINE

## 2015-09-25 LAB — CYTOLOGY - PAP

## 2015-09-25 NOTE — Progress Notes (Signed)
Appointment Date: 09/25/2015 DOB: September 21, 1980 Referring Provider: Reva Bores, MD Attending: Particia Nearing, MD  Teresa Day Poster was seen for genetic counseling because of a maternal age of 35 y.o..  Spanish/English interpretation was provided in person during the appointment.    In summary:  Discussed AMA and associated risk for fetal aneuploidy  Reviewed screening options during pregnancy, including timing for each  Declined First screen  Declined NIPS  Declined Quad screen  Reviewed diagnostic testing available including timing of each  Declined CVS  Declined amniocentesis  She was counseled regarding maternal age and the association with risk for chromosome conditions due to nondisjunction with aging of the ova.   We reviewed chromosomes, nondisjunction, and the associated 1 in 141 risk for fetal aneuploidy related to a maternal age of 35 y.o. at [redacted]w[redacted]d gestation.  She was counseled that the risk for aneuploidy decreases as gestational age increases, accounting for those pregnancies which spontaneously abort.  We specifically discussed Down syndrome (trisomy 72), trisomies 69 and 81, and sex chromosome aneuploidies (47,XXX and 47,XXY) including the common features and prognoses of each.   We reviewed available screening options including First Screen, Quad screen, noninvasive prenatal screening (NIPS)/cell free DNA (cfDNA) testing, and detailed ultrasound.  She was counseled that screening tests are used to modify a patient's a priori risk for aneuploidy, typically based on age. This estimate provides a pregnancy specific risk assessment. We reviewed the benefits and limitations of each option. Specifically, we discussed the conditions for which each test screens, the detection rates, and false positive rates of each. She was also counseled regarding diagnostic testing via CVS and amniocentesis. We reviewed the approximate 1 in 300-500 risk for complications from  amniocentesis, including spontaneous pregnancy loss. She understands that screening tests cannot rule out all birth defects or genetic syndromes. The patient was advised of this limitation and states she does not want screening or testing at this time.   Both family histories were reviewed and found to be noncontributory for birth defects, intellectual disability, and known genetic conditions. Without further information regarding the provided family history, an accurate genetic risk cannot be calculated. Further genetic counseling is warranted if more information is obtained.  Ms. Adair Laundry denied exposure to environmental toxins or chemical agents. She denied the use of alcohol, tobacco or street drugs. She denied significant viral illnesses during the course of her pregnancy. Her medical and surgical histories were noncontributory.   I counseled Ms. Kokesh, through a Spanish interpreter, regarding the above risks and available options.  The approximate face-to-face time with the genetic counselor was 60 minutes. Most of the counseling was provided by Azucena Freed, [employer] genetic counseling student, under my direct supervision.   Mady Gemma, MS,  Certified Genetic Counselor

## 2015-09-27 ENCOUNTER — Telehealth: Payer: Self-pay | Admitting: *Deleted

## 2015-09-27 NOTE — Telephone Encounter (Signed)
Received a call from [facility] stating they did receive her application and did verify benefits but need part 3 completed and faxed back before they can forward to patient assistance program and get filled by [facility].  Completed part 3 and faxed form to [facility] today.

## 2015-10-05 ENCOUNTER — Encounter (HOSPITAL_COMMUNITY): Payer: Self-pay | Admitting: Family Medicine

## 2015-10-05 NOTE — H&P (Signed)
  Teresa Day is an 35 y.o. [id number] 4956w5d female.   Chief Complaint: Incompetent cervix HPI: Here for cervical cerclage. Has h/o incompetent cervix with cerclage placement in previous pregnancy.  Past Medical History  Diagnosis Date  . Medical history non-contributory   . Preterm labor     Past Surgical History  Procedure Laterality Date  . Cesarean section    . Cervical cerclage  04/01/2012    Procedure: CERCLAGE CERVICAL;  Surgeon: Reva Boresanya S Pratt, MD;  Location: [facility];  Service: Gynecology;  Laterality: N/A;  . Cesarean section N/A 09/22/2012    Procedure: CESAREAN SECTION;  Surgeon: Adam PhenixJames G Arnold, MD;  Location: [facility];  Service: Obstetrics;  Laterality: N/A;  Repeat  . Cervical cerclage N/A 09/22/2012    Procedure: CERCLAGE CERVICAL REMOVAL;  Surgeon: Adam PhenixJames G Arnold, MD;  Location: [facility];  Service: Obstetrics;  Laterality: N/A;    Family History  Problem Relation Age of Onset  . Other Neg Hx   . Hypertension Mother   . Hypertension Father    Social History:  reports that she has never smoked. She has never used smokeless tobacco. She reports that she does not drink alcohol or use illicit drugs.   No Known Allergies  No current facility-administered medications on file prior to encounter.   Current Outpatient Prescriptions on File Prior to Encounter  Medication Sig Dispense Refill  . hydroxyprogesterone caproate (MAKENA) 250 mg/mL OIL injection Inject 1 mL (250 mg total) into the muscle once. 0.98 mL 5  . Prenatal Multivit-Min-Fe-FA (PRENATAL VITAMINS) 0.8 MG tablet Take 1 tablet by mouth daily. 30 tablet 9  . terconazole (TERAZOL 7) 0.4 % vaginal cream Place 1 applicator vaginally at bedtime. 45 g 0    A comprehensive review of systems was negative.  Last menstrual period 07/08/2015, currently breastfeeding. LMP 07/08/2015 (Exact Date) General appearance: alert, cooperative and appears stated age Head: Normocephalic, without obvious abnormality,  atraumatic Neck: supple, symmetrical, trachea midline Lungs: normal effort Heart: regular rate and rhythm Abdomen: soft, non-tender; bowel sounds normal; no masses,  no organomegaly Extremities: extremities normal, atraumatic, no cyanosis or edema Skin: Skin color, texture, turgor normal. No rashes or lesions Neurologic: Grossly normal   Lab Results  Component Value Date   WBC 9.6 09/23/2015   HGB 13.0 09/23/2015   HCT 37.0 09/23/2015   MCV 88.3 09/23/2015   PLT 272 09/23/2015         ABO, Rh: B/POS/-- (02/20 1141)  Antibody: NEG (02/20 1141)  Rubella: !Error!  RPR: NON REAC (02/20 1141)  HBsAg: NEGATIVE (02/20 1141)  HIV: NONREACTIVE (02/20 1141)  GBS:       Assessment/Plan Principal Problem:   Incompetent cervix in pregnancy, antepartum  For cervical cerclage. Risks include but are not limited to bleeding, infection, injury to surrounding structures, including ROM, bladder and ureters, blood clots, and death.  Likelihood of success is high.   PRATT,TANYA S 10/05/2015, 8:47 AM

## 2015-10-08 ENCOUNTER — Encounter (HOSPITAL_COMMUNITY)
Admission: RE | Disposition: A | Discharge status: Home / Self Care | Payer: Self-pay | Source: Ambulatory Visit | Attending: Family Medicine

## 2015-10-08 ENCOUNTER — Ambulatory Visit (HOSPITAL_COMMUNITY): Payer: Self-pay | Admitting: Anesthesiology

## 2015-10-08 ENCOUNTER — Ambulatory Visit (HOSPITAL_COMMUNITY)
Admission: RE | Admit: 2015-10-08 | Discharge: 2015-10-08 | Disposition: A | Discharge status: Home / Self Care | Payer: Self-pay | Source: Ambulatory Visit | Attending: Family Medicine | Admitting: Family Medicine

## 2015-10-08 ENCOUNTER — Encounter (HOSPITAL_COMMUNITY): Payer: Self-pay

## 2015-10-08 DIAGNOSIS — Z3A12 12 weeks gestation of pregnancy: Secondary | ICD-10-CM | POA: Insufficient documentation

## 2015-10-08 DIAGNOSIS — O3430 Maternal care for cervical incompetence, unspecified trimester: Secondary | ICD-10-CM | POA: Diagnosis present

## 2015-10-08 DIAGNOSIS — O3432 Maternal care for cervical incompetence, second trimester: Secondary | ICD-10-CM

## 2015-10-08 DIAGNOSIS — O3431 Maternal care for cervical incompetence, first trimester: Secondary | ICD-10-CM | POA: Insufficient documentation

## 2015-10-08 HISTORY — PX: CERVICAL CERCLAGE: SHX1329

## 2015-10-08 SURGERY — CERCLAGE, CERVIX, VAGINAL APPROACH
Anesthesia: Spinal | Site: Vagina

## 2015-10-08 MED ORDER — FENTANYL CITRATE (PF) 100 MCG/2ML IJ SOLN: 25.0000 ug | INTRAMUSCULAR | Status: DC | PRN

## 2015-10-08 MED ORDER — LACTATED RINGERS IV SOLN
INTRAVENOUS | Status: DC
  Administered 2015-10-08: 125 mL/h via INTRAVENOUS

## 2015-10-08 MED ORDER — PHENYLEPHRINE HCL 10 MG/ML IJ SOLN
INTRAMUSCULAR | Status: DC | PRN
  Administered 2015-10-08: 10 ug via INTRAVENOUS

## 2015-10-08 MED ORDER — BUPIVACAINE HCL (PF) 0.25 % IJ SOLN
INTRAMUSCULAR | Status: AC
Start: 2015-10-08 — End: 2015-10-08
  Filled 2015-10-08: qty 30

## 2015-10-08 MED ORDER — LACTATED RINGERS IV SOLN
INTRAVENOUS | Status: DC
  Administered 2015-10-08: 12:00:00 via INTRAVENOUS

## 2015-10-08 MED ORDER — 0.9 % SODIUM CHLORIDE (POUR BTL) OPTIME
TOPICAL | Status: DC | PRN
  Administered 2015-10-08: 1000 mL

## 2015-10-08 MED ORDER — BUPIVACAINE IN DEXTROSE 0.75-8.25 % IT SOLN
INTRATHECAL | Status: AC
  Filled 2015-10-08: qty 2

## 2015-10-08 MED ORDER — INDOMETHACIN 50 MG PO CAPS: 50.0000 mg | ORAL_CAPSULE | Freq: Four times a day (QID) | ORAL | Status: DC

## 2015-10-08 MED ORDER — PHENYLEPHRINE 40 MCG/ML (10ML) SYRINGE FOR IV PUSH (FOR BLOOD PRESSURE SUPPORT)
PREFILLED_SYRINGE | INTRAVENOUS | Status: AC
  Filled 2015-10-08: qty 10

## 2015-10-08 SURGICAL SUPPLY — 19 items
CLOTH BEACON ORANGE TIMEOUT ST (SAFETY) ×3 IMPLANT
COUNTER NEEDLE 1200 MAGNETIC (NEEDLE) ×2 IMPLANT
GLOVE BIOGEL PI IND STRL 7.0 (GLOVE) ×2 IMPLANT
GLOVE BIOGEL PI INDICATOR 7.0 (GLOVE) ×4
GLOVE ECLIPSE 7.0 STRL STRAW (GLOVE) ×6 IMPLANT
GOWN STRL REUS W/TWL LRG LVL3 (GOWN DISPOSABLE) ×9 IMPLANT
NDL MAYO CATGUT SZ4 TPR NDL (NEEDLE) ×1 IMPLANT
NEEDLE MAYO CATGUT SZ4 (NEEDLE) ×3 IMPLANT
PACK VAGINAL MINOR WOMEN LF (CUSTOM PROCEDURE TRAY) ×3 IMPLANT
PAD OB MATERNITY 4.3X12.25 (PERSONAL CARE ITEMS) ×3 IMPLANT
PAD PREP 24X48 CUFFED NSTRL (MISCELLANEOUS) ×3 IMPLANT
SUT MERSILENE 5MM BP 1 12 (SUTURE) ×3 IMPLANT
SYR BULB IRRIGATION 50ML (SYRINGE) ×3 IMPLANT
TOWEL OR 17X24 6PK STRL BLUE (TOWEL DISPOSABLE) ×6 IMPLANT
TRAY FOLEY CATH SILVER 14FR (SET/KITS/TRAYS/PACK) ×3 IMPLANT
TUBING NON-CON 1/4 X 20 CONN (TUBING) IMPLANT
TUBING NON-CON 1/4 X 20' CONN (TUBING)
WATER STERILE IRR 1000ML POUR (IV SOLUTION) ×3 IMPLANT
YANKAUER SUCT BULB TIP NO VENT (SUCTIONS) IMPLANT

## 2015-10-08 NOTE — Anesthesia Procedure Notes (Signed)
Spinal Patient location during procedure: OR Preanesthetic Checklist Completed: patient identified, site marked, surgical consent, pre-op evaluation, timeout performed, IV checked, risks and benefits discussed and monitors and equipment checked Spinal Block Patient position: sitting Prep: DuraPrep Patient monitoring: heart rate, cardiac monitor, continuous pulse ox and blood pressure Approach: midline Location: L3-4 Injection technique: single-shot Needle Needle type: Sprotte  Needle gauge: 24 G Needle length: 9 cm Assessment Sensory level: T10 Additional Notes Spinal Dosage in OR  Bupivicaine ml       1.2   

## 2015-10-08 NOTE — Transfer of Care (Signed)
Immediate Anesthesia Transfer of Care Note  Patient: Teresa Day  Procedure(s) Performed: Procedure(s): CERCLAGE CERVICAL (N/A)  Patient Location: PACU  Anesthesia Type:Spinal  Level of Consciousness: awake, alert , oriented and patient cooperative  Airway & Oxygen Therapy: Patient Spontanous Breathing  Post-op Assessment: Report given to RN and Post -op Vital signs reviewed and stable  Post vital signs: Reviewed and stable  Last Vitals:  Filed Vitals:   10/08/15 0856  BP: 112/87  Pulse: 66  Temp: 36.4 C  Resp: 16    Complications: No apparent anesthesia complications

## 2015-10-08 NOTE — Op Note (Signed)
Preoperative diagnosis: Incompetent cervix   Postoperative diagnosis: Same  Procedure: Cervical cerclage  Surgeon: Tinnie Gensanya Imajean Mcdermid, M.D.  Anesthesia: Spinal - Cristela BlueKyle Jackson, MD  Findings: closed cervix  Estimated blood loss: 100 mL  Specimens: None  Reason for procedure: [place] [id number] 5127w1d, history of incompetent cervix in the past with 2 2nd trimester losses and cerclage in previous pregnancy.  Procedure: Patient was taken to the operating room where spinal analgesia was administered. She was prepped and draped in the usual sterile fashion.  A Foley catheter is used to drain her bladder. A timeout was performed. The patient had SCDs in place prior to procedure. The patient was in dorsal lithotomy.  A weighted speculum was placed inside the vagina.  A Deaver was used anteriorly. The cervix was grasped with an open ring  forcep. A 5 mm Mersilene band on a cutting needle was used and to put in a pursestring suture. This was started at 12:00 and exiting at 9:00, starting at 9:00 and exiting at 6:00, starting at 6:00 and exiting at 3:00, starting at 3:00 and exiting at 12:00. A suture was then tied down. All instrument, needle and lap counts were correct x 2. The patient was taken to recovery in stable condition.  Netanya Yazdani SMD 10/08/2015 10:21 AM

## 2015-10-08 NOTE — Anesthesia Preprocedure Evaluation (Signed)

## 2015-10-08 NOTE — Interval H&P Note (Signed)
History and Physical Interval Note:  10/08/2015 9:20 AM  Teresa Day  has presented today for surgery, with the diagnosis of Cervical incompetence  The various methods of treatment have been discussed with the patient and family. After consideration of risks, benefits and other options for treatment, the patient has consented to  Procedure(s): CERCLAGE CERVICAL (N/A) as a surgical intervention .  The patient's history has been reviewed, patient examined, no change in status, stable for surgery.  I have reviewed the patient's chart and labs.  Questions were answered to the patient's satisfaction.     [name] S

## 2015-10-08 NOTE — Discharge Instructions (Addendum)
Cerclaje cervical, cuidados posteriores (Cervical Cerclage, Care After) Siga estas instrucciones durante las prximas semanas. Estas indicaciones le proporcionan informacin acerca de cmo deber cuidarse despus del procedimiento. El mdico tambin podr darle instrucciones ms especficas. El tratamiento se ha planificado de acuerdo a las prcticas mdicas actuales, pero a veces se producen problemas. Comunquese con el mdico si tiene algn problema o tiene dudas despus del procedimiento. QU ESPERAR DESPUS DEL PROCEDIMIENTO  Despus del procedimiento, es tpico tener los siguientes sntomas:  Clicos abdominales.  Prdida o hemorragia vaginal. INSTRUCCIONES PARA EL CUIDADO EN EL HOGAR   Utilice los medicamentos de venta libre o recetados para Primary school teachercalmar el dolor, Environmental health practitionerel malestar o la fiebre, segn se lo indique el mdico.  Evite las actividades fsicas y los ejercicios hasta que su mdico la autorice.  No se haga duchas vaginales ni tenga relaciones sexuales hasta que el mdico la autorice.  Cumpla con todas las visitas de control de [place] y Clinical research associateprenatales, segn le indique su mdico. SOLICITE ATENCIN MDICA SI:   Tiene flujo vaginal anormal.  Tiene una erupcin cutnea.  Se siente mareada o se desmaya.  Tiene dolor abdominal y no puede controlarlo con analgsicos. SOLICITE ATENCIN MDICA DE INMEDIATO SI:   Presenta hemorragia vaginal.  Tiene una prdida de lquido importante o sale lquido a chorros por la vagina.  Tiene fiebre.  Se desmaya.  Siente contracciones en el tero.  Siente que el beb no se mueve tanto como antes o no puede sentir los movimientos del beb.  Siente falta de aire o Journalist, newspaperdolor en el pecho.   Esta informacin no tiene Theme park managercomo fin reemplazar el consejo del mdico. Asegrese de hacerle al mdico cualquier pregunta que tenga.   Document Released: 05/10/2013 Document Revised: 07/25/2013 Elsevier Interactive Patient Education [employer]2016 Elsevier Inc.

## 2015-10-09 ENCOUNTER — Encounter (HOSPITAL_COMMUNITY): Payer: Self-pay | Admitting: Family Medicine

## 2015-10-14 ENCOUNTER — Encounter: Payer: Self-pay | Admitting: Obstetrics and Gynecology

## 2015-10-14 ENCOUNTER — Ambulatory Visit (INDEPENDENT_AMBULATORY_CARE_PROVIDER_SITE_OTHER): Payer: Self-pay | Admitting: Obstetrics and Gynecology

## 2015-10-14 VITALS — BP 105/69 | HR 75 | Temp 98.3°F | Wt 172.3 lb

## 2015-10-14 DIAGNOSIS — O09522 Supervision of elderly multigravida, second trimester: Secondary | ICD-10-CM

## 2015-10-14 DIAGNOSIS — O34219 Maternal care for unspecified type scar from previous cesarean delivery: Secondary | ICD-10-CM

## 2015-10-14 DIAGNOSIS — O3432 Maternal care for cervical incompetence, second trimester: Secondary | ICD-10-CM

## 2015-10-14 DIAGNOSIS — O0992 Supervision of high risk pregnancy, unspecified, second trimester: Secondary | ICD-10-CM

## 2015-10-14 DIAGNOSIS — O3680X0 Pregnancy with inconclusive fetal viability, not applicable or unspecified: Secondary | ICD-10-CM

## 2015-10-14 DIAGNOSIS — O3680X1 Pregnancy with inconclusive fetal viability, fetus 1: Secondary | ICD-10-CM

## 2015-10-14 LAB — POCT URINALYSIS DIP (DEVICE)
Bilirubin Urine: NEGATIVE
GLUCOSE, UA: NEGATIVE mg/dL
Ketones, ur: NEGATIVE mg/dL
Nitrite: NEGATIVE
PH: 5.5 (ref 5.0–8.0)
PROTEIN: NEGATIVE mg/dL
Specific Gravity, Urine: 1.02 (ref 1.005–1.030)
UROBILINOGEN UA: 0.2 mg/dL (ref 0.0–1.0)

## 2015-10-14 NOTE — Progress Notes (Signed)
Bedside US for viability - Single IUP, FHR - 152 per PW doppler, FM present.

## 2015-10-14 NOTE — Progress Notes (Signed)
Breastfeeding tip of the week reviewed Spanish interpreter present 

## 2015-10-14 NOTE — Progress Notes (Signed)
Subjective:  Teresa Day is a 35 y.o. [id number] at 4610w0d being seen today for ongoing prenatal care.  She is currently monitored for the following issues for this high-risk pregnancy and has Supervision of high risk pregnancy, antepartum; Previous cesarean delivery, antepartum; Incompetent cervix in pregnancy, antepartum; and AMA (advanced maternal age) multigravida 35+ on her problem list.  Patient reports no complaints.  Contractions: Not present. Vag. Bleeding: None.   . Denies leaking of fluid.   The following portions of the patient's history were reviewed and updated as appropriate: allergies, current medications, past family history, past medical history, past social history, past surgical history and problem list. Problem list updated.  Objective:   Filed Vitals:   10/14/15 0907  BP: 105/69  Pulse: 75  Temp: 98.3 F (36.8 C)  Weight: 172 lb 4.8 oz (78.155 kg)    Fetal Status:           General:  Alert, oriented and cooperative. Patient is in no acute distress.  Skin: Skin is warm and dry. No rash noted.   Cardiovascular: Normal heart rate noted  Respiratory: Normal respiratory effort, no problems with respiration noted  Abdomen: Soft, gravid, appropriate for gestational age. Pain/Pressure: Absent     Pelvic: Vag. Bleeding: None     Cervical exam deferred        Extremities: Normal range of motion.  Edema: None  Mental Status: Normal mood and affect. Normal behavior. Normal judgment and thought content.   Urinalysis:      Assessment and Plan:  Pregnancy: [id number] at 6210w0d  1. Supervision of high risk pregnancy, antepartum, second trimester Patient is doing well s/p cerclage placement on 10/08/15. She denies cramping or vaginal bleeding  2. Previous cesarean delivery, antepartum Will be scheduled for repeat  3. Incompetent cervix in pregnancy, antepartum, second trimester S/p cerclage placement  4. AMA (advanced maternal age) multigravida 35+, second  trimester Quad screen at next visit needs to be offered. Patient has a lot of financial constraints Patient to see social worker for possible assistance  5. Examination to determine fetal viability of pregnancy, fetus 1 + FH on ultrasound - Amniotic fluid index  General obstetric precautions including but not limited to vaginal bleeding, contractions, leaking of fluid and fetal movement were reviewed in detail with the patient. Please refer to After Visit Summary for other counseling recommendations.  Return in about 3 weeks (around 11/04/2015).   Catalina AntiguaPeggy Derriana Oser, MD

## 2015-10-14 NOTE — Anesthesia Postprocedure Evaluation (Signed)
Anesthesia Post Note  Patient: Teresa Day  Procedure(s) Performed: Procedure(s) (LRB): CERCLAGE CERVICAL (N/A)  Patient location during evaluation: PACU Anesthesia Type: Spinal Level of consciousness: awake Pain management: satisfactory to patient Vital Signs Assessment: post-procedure vital signs reviewed and stable Respiratory status: spontaneous breathing Cardiovascular status: blood pressure returned to baseline Postop Assessment: no headache and spinal receding Anesthetic complications: no    Last Vitals:  Filed Vitals:   10/08/15 1215 10/08/15 1405  BP: 108/73 101/79  Pulse: 75 90  Temp:  37 C  Resp: 18 16    Last Pain: There were no vitals filed for this visit.               Jiles GarterJACKSON,Heriberto Stmartin EDWARD

## 2015-10-25 ENCOUNTER — Encounter: Payer: Self-pay | Admitting: *Deleted

## 2015-10-29 ENCOUNTER — Telehealth: Payer: Self-pay | Admitting: General Practice

## 2015-10-29 NOTE — Telephone Encounter (Signed)
Patient's 17p arrived in clinic. Called patient with pacific interpreter [phone] and informed her of arrival of 17p and need to come in for injection. Patient states the doctor told her she couldn't get both since she had a cerclage. Told patient that I am not sure of the conversation her and the doctor had but perhaps there was a misunderstanding. Told patient she can get both and both would be preferred as it continues to help her chances of carrying baby to term. Patient states no the doctor told her she couldn't do both and she would like to wait until her appt next week to discuss this with the doctor. Patient had no other questions

## 2015-11-03 ENCOUNTER — Inpatient Hospital Stay (HOSPITAL_COMMUNITY)
Admission: AD | Admit: 2015-11-03 | Discharge: 2015-11-03 | Disposition: A | Discharge status: Home / Self Care | Payer: Self-pay | Source: Ambulatory Visit | Attending: Obstetrics and Gynecology | Admitting: Obstetrics and Gynecology

## 2015-11-03 ENCOUNTER — Inpatient Hospital Stay (HOSPITAL_COMMUNITY): Payer: Self-pay

## 2015-11-03 ENCOUNTER — Encounter (HOSPITAL_COMMUNITY): Payer: Self-pay | Admitting: *Deleted

## 2015-11-03 DIAGNOSIS — O26892 Other specified pregnancy related conditions, second trimester: Secondary | ICD-10-CM | POA: Insufficient documentation

## 2015-11-03 DIAGNOSIS — O34219 Maternal care for unspecified type scar from previous cesarean delivery: Secondary | ICD-10-CM | POA: Insufficient documentation

## 2015-11-03 DIAGNOSIS — O9A212 Injury, poisoning and certain other consequences of external causes complicating pregnancy, second trimester: Secondary | ICD-10-CM

## 2015-11-03 DIAGNOSIS — Z3A16 16 weeks gestation of pregnancy: Secondary | ICD-10-CM | POA: Insufficient documentation

## 2015-11-03 DIAGNOSIS — W228XXA Striking against or struck by other objects, initial encounter: Secondary | ICD-10-CM

## 2015-11-03 DIAGNOSIS — S3991XA Unspecified injury of abdomen, initial encounter: Secondary | ICD-10-CM

## 2015-11-03 DIAGNOSIS — R109 Unspecified abdominal pain: Secondary | ICD-10-CM | POA: Insufficient documentation

## 2015-11-03 LAB — URINALYSIS, ROUTINE W REFLEX MICROSCOPIC
BILIRUBIN URINE: NEGATIVE
Glucose, UA: NEGATIVE mg/dL
KETONES UR: NEGATIVE mg/dL
NITRITE: NEGATIVE
PH: 6.5 (ref 5.0–8.0)
PROTEIN: NEGATIVE mg/dL
Specific Gravity, Urine: 1.01 (ref 1.005–1.030)

## 2015-11-03 LAB — URINE MICROSCOPIC-ADD ON

## 2015-11-03 NOTE — Discharge Instructions (Signed)

## 2015-11-03 NOTE — MAU Note (Signed)
Pt presents to MAU with complaints of accidentally being hit by a door at home. Denies any vaginal bleeding or abnormal discharge. Reports mild abdominal pain in the right side of her abdomen

## 2015-11-03 NOTE — Progress Notes (Signed)
Assisted NP with interpretation of medical information for assessment.  Also assisted Ultrasound tech with interpretation of procedure.  Spanish Interpreter

## 2015-11-03 NOTE — Progress Notes (Signed)
Assisted with interpretation of registration and medical information.  Spanish Interpreter

## 2015-11-03 NOTE — MAU Provider Note (Signed)
History     CSN: [id number]  Arrival date and time: 11/03/15 8295   First Provider Initiated Contact with Patient 11/03/15 1911      Chief Complaint  Patient presents with  . Abdominal Pain   HPI [place] 35 y.o. [redacted]w[redacted]d Comes here today as she was hit in the abdomen by a door and fell to the ground.  Her 35 year old "rebellious" son had kicked the door and she was standing on the other side of the door.  Client is tearful about the problems she is having with her son right now.  Was having abdominal pain earlier but now the pain has eased.  Denies any leaking or vaginal bleeding.  Significant history of second trimester losses and client has a cerclage that was placed on 10-08-15.  Has an appointment in the clinic downstairs on April 6.  OB History    Gravida Para Term Preterm AB TAB SAB Ectopic Multiple Living   0  0 0 3      Past Medical History  Diagnosis Date  . Preterm labor     Past Surgical History  Procedure Laterality Date  . Cesarean section    . Cervical cerclage  04/01/2012    Procedure: CERCLAGE CERVICAL;  Surgeon: Reva Bores, MD;  Location: [facility];  Service: Gynecology;  Laterality: N/A;  . Cesarean section N/A 09/22/2012    Procedure: CESAREAN SECTION;  Surgeon: Adam Phenix, MD;  Location: [facility];  Service: Obstetrics;  Laterality: N/A;  Repeat  . Cervical cerclage N/A 09/22/2012    Procedure: CERCLAGE CERVICAL REMOVAL;  Surgeon: Adam Phenix, MD;  Location: [facility];  Service: Obstetrics;  Laterality: N/A;  . Cervical cerclage N/A 10/08/2015    Procedure: CERCLAGE CERVICAL;  Surgeon: Reva Bores, MD;  Location: [facility];  Service: Gynecology;  Laterality: N/A;    Family History  Problem Relation Age of Onset  . Other Neg Hx   . Hypertension Mother   . Hypertension Father     Social History  Substance Use Topics  . Smoking status: Never Smoker   . Smokeless tobacco: Never Used  . Alcohol Use: No    Allergies: No Known  Allergies  Prescriptions prior to admission  Medication Sig Dispense Refill Last Dose  . hydroxyprogesterone caproate (MAKENA) 250 mg/mL OIL injection Inject 1 mL (250 mg total) into the muscle once. (Patient not taking: Reported on 10/14/2015) 0.98 mL 5 Not Taking  . indomethacin (INDOCIN) 50 MG capsule Take 1 capsule (50 mg total) by mouth 4 (four) times daily. 12 capsule 0 Taking  . Prenatal Multivit-Min-Fe-FA (PRENATAL VITAMINS) 0.8 MG tablet Take 1 tablet by mouth daily. 30 tablet 9 Taking  . terconazole (TERAZOL 7) 0.4 % vaginal cream Place 1 applicator vaginally at bedtime. (Patient not taking: Reported on 10/08/2015) 45 g 0 Not Taking    Review of Systems  Constitutional: Negative for fever.  Gastrointestinal: Positive for abdominal pain. Negative for nausea and vomiting.  Genitourinary:       No vaginal discharge. No vaginal bleeding. No dysuria.  Musculoskeletal: Positive for falls. Negative for myalgias.       Hit in the abdomen by a door and fell down to the floor.   Physical Exam   Blood pressure 121/88, pulse 89, temperature 98.3 F (36.8 C), resp. rate 18, last menstrual period 07/08/2015, currently breastfeeding.  Physical Exam  Nursing note and vitals reviewed. Constitutional: She is oriented to  person, place, and time. She appears well-developed and well-nourished. No distress.  Tearful over situation with her teenaged son  HENT:  Head: Normocephalic.  Eyes: EOM are normal.  Neck: Neck supple.  GI: Soft. There is no tenderness.  No bruising, no redness seen. FHT heard with doppler.  Musculoskeletal: Normal range of motion.  Neurological: She is alert and oriented to person, place, and time.  Skin: Skin is warm and dry.  Psychiatric: She has a normal mood and affect.   Ultrasound: no abnormal findings. Cervical length 3.9cm  MAU Course  Procedures  MDM Advised to ask to see social worker at her next prenatal visit to get some suggestions on resources to  help her with her son.    To ultrasound - care assumed by L. Leftwich Kirby at 2000.  Assessment and Plan   1. Previous cesarean delivery, antepartum   2. Traumatic injury during pregnancy in second trimester    DC home Comfort measures reviewed  2ndTrimester precautions  PTL precautions  Fetal kick counts RX: none  Return to MAU as needed FU with OB as planned  Follow-up Information    [facility]   Specialty:  Obstetrics and Gynecology   Why:  As scheduled   Contact information:   [address] [place] [id number] [phone]       Nolene BernheimBURLESON,TERRI 11/03/2015, 7:50 PM

## 2015-11-03 NOTE — Progress Notes (Signed)
Assisted RN with interpretation of triage information.  Spanish Interpreter

## 2015-11-07 ENCOUNTER — Ambulatory Visit (INDEPENDENT_AMBULATORY_CARE_PROVIDER_SITE_OTHER): Payer: Self-pay | Admitting: Obstetrics & Gynecology

## 2015-11-07 ENCOUNTER — Encounter: Payer: Self-pay | Admitting: Obstetrics & Gynecology

## 2015-11-07 VITALS — BP 114/72 | HR 83 | Temp 98.2°F | Wt 175.5 lb

## 2015-11-07 DIAGNOSIS — O3680X1 Pregnancy with inconclusive fetal viability, fetus 1: Secondary | ICD-10-CM

## 2015-11-07 DIAGNOSIS — O3432 Maternal care for cervical incompetence, second trimester: Secondary | ICD-10-CM

## 2015-11-07 DIAGNOSIS — O0992 Supervision of high risk pregnancy, unspecified, second trimester: Secondary | ICD-10-CM

## 2015-11-07 DIAGNOSIS — O09522 Supervision of elderly multigravida, second trimester: Secondary | ICD-10-CM

## 2015-11-07 DIAGNOSIS — O3680X0 Pregnancy with inconclusive fetal viability, not applicable or unspecified: Secondary | ICD-10-CM

## 2015-11-07 DIAGNOSIS — Z36 Encounter for antenatal screening of mother: Secondary | ICD-10-CM

## 2015-11-07 LAB — POCT URINALYSIS DIP (DEVICE)
BILIRUBIN URINE: NEGATIVE
GLUCOSE, UA: NEGATIVE mg/dL
Ketones, ur: NEGATIVE mg/dL
NITRITE: NEGATIVE
PH: 5.5 (ref 5.0–8.0)
Protein, ur: NEGATIVE mg/dL
Specific Gravity, Urine: 1.02 (ref 1.005–1.030)
Urobilinogen, UA: 0.2 mg/dL (ref 0.0–1.0)

## 2015-11-07 NOTE — Progress Notes (Signed)
Bedside US for viability =  Single IUP; FHR - 146 bpm per PW doppler; FL - 2.43cm (7258w2d) ; FM present

## 2015-11-07 NOTE — Progress Notes (Signed)
Spanish interpreter Raquel Mora Pain- lower abd

## 2015-11-07 NOTE — Progress Notes (Signed)
Subjective: feels some movement  Teresa Day is a 35 y.o. [id number] at 5752w3d being seen today for ongoing prenatal care.  She is currently monitored for the following issues for this high-risk pregnancy and has Supervision of high risk pregnancy, antepartum; Previous cesarean delivery, antepartum; Incompetent cervix in pregnancy, antepartum; and AMA (advanced maternal age) multigravida 35+ on her problem list.  Patient reports no complaints.  Contractions: Not present. Vag. Bleeding: None.  Movement: Present. Denies leaking of fluid.   The following portions of the patient's history were reviewed and updated as appropriate: allergies, current medications, past family history, past medical history, past social history, past surgical history and problem list. Problem list updated.  Objective:   Filed Vitals:   11/07/15 0936  BP: 114/72  Pulse: 83  Temp: 98.2 F (36.8 C)  Weight: 175 lb 8 oz (79.606 kg)    Fetal Status:     Movement: Present     General:  Alert, oriented and cooperative. Patient is in no acute distress.  Skin: Skin is warm and dry. No rash noted.   Cardiovascular: Normal heart rate noted  Respiratory: Normal respiratory effort, no problems with respiration noted  Abdomen: Soft, gravid, appropriate for gestational age. Pain/Pressure: Present     Pelvic: Vag. Bleeding: None     Cervical exam deferred        Extremities: Normal range of motion.  Edema: None  Mental Status: Normal mood and affect. Normal behavior. Normal judgment and thought content.   Urinalysis: Urine Protein: Negative Urine Glucose: Negative  Assessment and Plan:  Pregnancy: [id number] at 7252w3d  1. AMA (advanced maternal age) multigravida 35+, second trimester   2. Supervision of high risk pregnancy, antepartum, second trimester   3. Incompetent cervix in pregnancy, antepartum, second trimester Cerclage in place  Preterm labor symptoms and general obstetric precautions including but  not limited to vaginal bleeding, contractions, leaking of fluid and fetal movement were reviewed in detail with the patient. Please refer to After Visit Summary for other counseling recommendations.  Return in about 4 weeks (around 12/05/2015).   Adam PhenixJames G Kema Santaella, MD

## 2015-11-07 NOTE — Patient Instructions (Signed)

## 2015-11-07 NOTE — Progress Notes (Signed)
U/S scheduled 04/17 @ 10am

## 2015-11-18 ENCOUNTER — Other Ambulatory Visit: Payer: Self-pay | Admitting: Obstetrics & Gynecology

## 2015-11-18 ENCOUNTER — Encounter (HOSPITAL_COMMUNITY): Payer: Self-pay

## 2015-11-18 ENCOUNTER — Ambulatory Visit (HOSPITAL_COMMUNITY)
Admission: RE | Admit: 2015-11-18 | Discharge: 2015-11-18 | Disposition: A | Discharge status: Home / Self Care | Payer: Self-pay | Source: Ambulatory Visit | Attending: Obstetrics & Gynecology | Admitting: Obstetrics & Gynecology

## 2015-11-18 VITALS — BP 125/81 | HR 86 | Wt 180.4 lb

## 2015-11-18 DIAGNOSIS — Z36 Encounter for antenatal screening of mother: Secondary | ICD-10-CM | POA: Insufficient documentation

## 2015-11-18 DIAGNOSIS — O3432 Maternal care for cervical incompetence, second trimester: Secondary | ICD-10-CM | POA: Insufficient documentation

## 2015-11-18 DIAGNOSIS — O3430 Maternal care for cervical incompetence, unspecified trimester: Secondary | ICD-10-CM

## 2015-11-18 DIAGNOSIS — O0992 Supervision of high risk pregnancy, unspecified, second trimester: Secondary | ICD-10-CM

## 2015-11-18 DIAGNOSIS — O09292 Supervision of pregnancy with other poor reproductive or obstetric history, second trimester: Secondary | ICD-10-CM

## 2015-11-18 DIAGNOSIS — Z3A19 19 weeks gestation of pregnancy: Secondary | ICD-10-CM | POA: Insufficient documentation

## 2015-11-18 DIAGNOSIS — O09522 Supervision of elderly multigravida, second trimester: Secondary | ICD-10-CM

## 2015-11-21 ENCOUNTER — Telehealth: Payer: Self-pay

## 2015-11-21 NOTE — Telephone Encounter (Signed)
Pharmacy called requesting the clinic called them back regarding this patient. [phone]

## 2015-12-05 ENCOUNTER — Ambulatory Visit (INDEPENDENT_AMBULATORY_CARE_PROVIDER_SITE_OTHER): Payer: Self-pay | Admitting: Family

## 2015-12-05 ENCOUNTER — Encounter: Payer: Self-pay | Admitting: Family

## 2015-12-05 VITALS — BP 115/73 | HR 73 | Wt 182.4 lb

## 2015-12-05 DIAGNOSIS — O09523 Supervision of elderly multigravida, third trimester: Secondary | ICD-10-CM

## 2015-12-05 DIAGNOSIS — O26892 Other specified pregnancy related conditions, second trimester: Secondary | ICD-10-CM

## 2015-12-05 DIAGNOSIS — O34219 Maternal care for unspecified type scar from previous cesarean delivery: Secondary | ICD-10-CM

## 2015-12-05 DIAGNOSIS — O09299 Supervision of pregnancy with other poor reproductive or obstetric history, unspecified trimester: Secondary | ICD-10-CM | POA: Insufficient documentation

## 2015-12-05 DIAGNOSIS — O9989 Other specified diseases and conditions complicating pregnancy, childbirth and the puerperium: Secondary | ICD-10-CM

## 2015-12-05 DIAGNOSIS — O0992 Supervision of high risk pregnancy, unspecified, second trimester: Secondary | ICD-10-CM

## 2015-12-05 DIAGNOSIS — M549 Dorsalgia, unspecified: Secondary | ICD-10-CM

## 2015-12-05 DIAGNOSIS — O09292 Supervision of pregnancy with other poor reproductive or obstetric history, second trimester: Secondary | ICD-10-CM

## 2015-12-05 LAB — POCT URINALYSIS DIP (DEVICE)
Bilirubin Urine: NEGATIVE
GLUCOSE, UA: NEGATIVE mg/dL
KETONES UR: NEGATIVE mg/dL
Nitrite: NEGATIVE
PROTEIN: NEGATIVE mg/dL
SPECIFIC GRAVITY, URINE: 1.02 (ref 1.005–1.030)
Urobilinogen, UA: 0.2 mg/dL (ref 0.0–1.0)
pH: 5.5 (ref 5.0–8.0)

## 2015-12-05 MED ORDER — HYDROXYPROGESTERONE CAPROATE 250 MG/ML IM OIL
250.0000 mg | TOPICAL_OIL | Freq: Once | INTRAMUSCULAR | Status: AC
  Administered 2015-12-05: 250 mg via INTRAMUSCULAR

## 2015-12-05 MED ORDER — CYCLOBENZAPRINE HCL 10 MG PO TABS: 10.0000 mg | ORAL_TABLET | Freq: Three times a day (TID) | ORAL | Status: DC | PRN

## 2015-12-05 NOTE — Addendum Note (Signed)
Addended by: Cheree DittoGRAHAM, Monzerrath Mcburney A on: 12/05/2015 11:04 AM   Modules accepted: Orders

## 2015-12-05 NOTE — Progress Notes (Signed)
Pt is confuse about Makena. She has been approved to get this medicine but she is very confused at this time another provider told her she does not need it with this pregnancy.

## 2015-12-05 NOTE — Progress Notes (Signed)
Subjective:  Teresa Day is a 35 y.o. [phone] at [redacted]w[redacted]d being seen today for ongoing prenatal care.  She is currently monitored for the following issues for this high-risk pregnancy and has Supervision of high risk pregnancy, antepartum; Previous cesarean delivery, antepartum; Incompetent cervix in pregnancy, antepartum; AMA (advanced maternal age) multigravida 35+; and History of preterm premature rupture of membranes (PROM) in previous pregnancy, currently pregnant on her problem list.  Patient reports intermittent  backache with walking.  Reports doing well.  Her youngest child excited she's having a girl.   Contractions: Not present.  .  Movement: Present. Denies leaking of fluid.   The following portions of the patient's history were reviewed and updated as appropriate: allergies, current medications, past family history, past medical history, past social history, past surgical history and problem list. Problem list updated.  Objective:   Filed Vitals:   12/05/15 1015  BP: 115/73  Pulse: 73  Weight: 182 lb 6.4 oz (82.736 kg)    Fetal Status: Fetal Heart Rate (bpm): 144 Fundal Height: 23 cm Movement: Present     General:  Alert, oriented and cooperative. Patient is in no acute distress.  Skin: Skin is warm and dry. No rash noted.   Cardiovascular: Normal heart rate noted  Respiratory: Normal respiratory effort, no problems with respiration noted  Abdomen: Soft, gravid, appropriate for gestational age. Pain/Pressure: Present     Pelvic:       Cervical exam deferred        Extremities: Normal range of motion.  Edema: None  Mental Status: Normal mood and affect. Normal behavior. Normal judgment and thought content.   Urinalysis: Urine Protein: Negative Urine Glucose: Negative  Assessment and Plan:  Pregnancy: [id number] at [redacted]w[redacted]d  1. Supervision of high risk pregnancy, antepartum, second trimester - Close monitoring  2. Back pain affecting pregnancy in second  trimester - cyclobenzaprine (FLEXERIL) 10 MG tablet; Take 1 tablet (10 mg total) by mouth every 8 (eight) hours as needed for muscle spasms.  Dispense: 30 tablet; Refill: 1  3. History of preterm premature rupture of membranes (PROM) in previous pregnancy, currently pregnant, second trimester - Begin 17p- today  4. Previous cesarean delivery, antepartum - Will have repeat  5. AMA (advanced maternal age) multigravida 35+, third trimester - Declined genetic testing  Preterm labor symptoms and general obstetric precautions including but not limited to vaginal bleeding, contractions, leaking of fluid and fetal movement were reviewed in detail with the patient. Please refer to After Visit Summary for other counseling recommendations.  Return in about 3 weeks (around 12/26/2015) for 17p weekly.   Eino FarberWalidah Kennith GainN Karim, CNM

## 2015-12-09 ENCOUNTER — Ambulatory Visit (HOSPITAL_COMMUNITY)
Admission: RE | Admit: 2015-12-09 | Discharge: 2015-12-09 | Disposition: A | Discharge status: Home / Self Care | Payer: Self-pay | Source: Ambulatory Visit | Attending: Obstetrics & Gynecology | Admitting: Obstetrics & Gynecology

## 2015-12-09 ENCOUNTER — Encounter (HOSPITAL_COMMUNITY): Payer: Self-pay

## 2015-12-09 DIAGNOSIS — O3432 Maternal care for cervical incompetence, second trimester: Secondary | ICD-10-CM | POA: Insufficient documentation

## 2015-12-09 DIAGNOSIS — O09522 Supervision of elderly multigravida, second trimester: Secondary | ICD-10-CM | POA: Insufficient documentation

## 2015-12-09 DIAGNOSIS — O09292 Supervision of pregnancy with other poor reproductive or obstetric history, second trimester: Secondary | ICD-10-CM | POA: Insufficient documentation

## 2015-12-09 DIAGNOSIS — Z3A22 22 weeks gestation of pregnancy: Secondary | ICD-10-CM | POA: Insufficient documentation

## 2015-12-09 DIAGNOSIS — O3430 Maternal care for cervical incompetence, unspecified trimester: Secondary | ICD-10-CM

## 2015-12-11 ENCOUNTER — Ambulatory Visit (INDEPENDENT_AMBULATORY_CARE_PROVIDER_SITE_OTHER): Payer: Self-pay | Admitting: General Practice

## 2015-12-11 VITALS — BP 111/63 | HR 69 | Ht 60.0 in | Wt 182.0 lb

## 2015-12-11 DIAGNOSIS — O09292 Supervision of pregnancy with other poor reproductive or obstetric history, second trimester: Secondary | ICD-10-CM

## 2015-12-11 MED ORDER — HYDROXYPROGESTERONE CAPROATE 250 MG/ML IM OIL
250.0000 mg | TOPICAL_OIL | Freq: Once | INTRAMUSCULAR | Status: AC
  Administered 2015-12-11: 250 mg via INTRAMUSCULAR

## 2015-12-17 ENCOUNTER — Ambulatory Visit (INDEPENDENT_AMBULATORY_CARE_PROVIDER_SITE_OTHER): Payer: Self-pay | Admitting: *Deleted

## 2015-12-17 VITALS — BP 114/66 | HR 76 | Wt 188.3 lb

## 2015-12-17 DIAGNOSIS — O09292 Supervision of pregnancy with other poor reproductive or obstetric history, second trimester: Secondary | ICD-10-CM

## 2015-12-17 DIAGNOSIS — O09212 Supervision of pregnancy with history of pre-term labor, second trimester: Secondary | ICD-10-CM

## 2015-12-17 DIAGNOSIS — O3432 Maternal care for cervical incompetence, second trimester: Secondary | ICD-10-CM

## 2015-12-17 DIAGNOSIS — O3430 Maternal care for cervical incompetence, unspecified trimester: Secondary | ICD-10-CM

## 2015-12-17 MED ORDER — HYDROXYPROGESTERONE CAPROATE 250 MG/ML IM OIL
250.0000 mg | TOPICAL_OIL | Freq: Once | INTRAMUSCULAR | Status: AC
  Administered 2015-12-17: 250 mg via INTRAMUSCULAR

## 2015-12-20 ENCOUNTER — Encounter (HOSPITAL_COMMUNITY): Payer: Self-pay

## 2015-12-23 ENCOUNTER — Other Ambulatory Visit (HOSPITAL_COMMUNITY): Payer: Self-pay | Admitting: Maternal and Fetal Medicine

## 2015-12-23 ENCOUNTER — Ambulatory Visit (HOSPITAL_COMMUNITY)
Admission: RE | Admit: 2015-12-23 | Discharge: 2015-12-23 | Disposition: A | Discharge status: Home / Self Care | Payer: Self-pay | Source: Ambulatory Visit | Attending: Obstetrics & Gynecology | Admitting: Obstetrics & Gynecology

## 2015-12-23 ENCOUNTER — Encounter (HOSPITAL_COMMUNITY): Payer: Self-pay

## 2015-12-23 VITALS — BP 106/60 | HR 85 | Wt 189.6 lb

## 2015-12-23 DIAGNOSIS — Z3A24 24 weeks gestation of pregnancy: Secondary | ICD-10-CM | POA: Insufficient documentation

## 2015-12-23 DIAGNOSIS — O3430 Maternal care for cervical incompetence, unspecified trimester: Secondary | ICD-10-CM

## 2015-12-23 DIAGNOSIS — O09529 Supervision of elderly multigravida, unspecified trimester: Secondary | ICD-10-CM

## 2015-12-23 DIAGNOSIS — O3432 Maternal care for cervical incompetence, second trimester: Secondary | ICD-10-CM | POA: Insufficient documentation

## 2015-12-23 DIAGNOSIS — O09522 Supervision of elderly multigravida, second trimester: Secondary | ICD-10-CM | POA: Insufficient documentation

## 2015-12-23 DIAGNOSIS — O09292 Supervision of pregnancy with other poor reproductive or obstetric history, second trimester: Secondary | ICD-10-CM | POA: Insufficient documentation

## 2015-12-26 ENCOUNTER — Ambulatory Visit (INDEPENDENT_AMBULATORY_CARE_PROVIDER_SITE_OTHER): Payer: Self-pay | Admitting: Family

## 2015-12-26 VITALS — BP 100/72 | HR 80 | Wt 187.2 lb

## 2015-12-26 DIAGNOSIS — O09212 Supervision of pregnancy with history of pre-term labor, second trimester: Secondary | ICD-10-CM

## 2015-12-26 DIAGNOSIS — R8271 Bacteriuria: Secondary | ICD-10-CM

## 2015-12-26 DIAGNOSIS — O3432 Maternal care for cervical incompetence, second trimester: Secondary | ICD-10-CM

## 2015-12-26 DIAGNOSIS — O09292 Supervision of pregnancy with other poor reproductive or obstetric history, second trimester: Secondary | ICD-10-CM

## 2015-12-26 DIAGNOSIS — O0992 Supervision of high risk pregnancy, unspecified, second trimester: Secondary | ICD-10-CM

## 2015-12-26 LAB — POCT URINALYSIS DIP (DEVICE)
Bilirubin Urine: NEGATIVE
Glucose, UA: NEGATIVE mg/dL
Ketones, ur: NEGATIVE mg/dL
NITRITE: NEGATIVE
PH: 6.5 (ref 5.0–8.0)
PROTEIN: 30 mg/dL — AB
Specific Gravity, Urine: 1.015 (ref 1.005–1.030)
Urobilinogen, UA: 1 mg/dL (ref 0.0–1.0)

## 2015-12-26 MED ORDER — HYDROXYPROGESTERONE CAPROATE 250 MG/ML IM OIL
250.0000 mg | TOPICAL_OIL | Freq: Once | INTRAMUSCULAR | Status: AC
  Administered 2015-12-26: 250 mg via INTRAMUSCULAR

## 2015-12-26 NOTE — Progress Notes (Signed)
17-p today Breastfeeding tip reviewed Spanish interpreter present Urine: lg amt wbc, sm amt hgb

## 2015-12-26 NOTE — Progress Notes (Signed)
Subjective:  Teresa Day is a 35 y.o. [phone] at 5358w3d being seen today for ongoing prenatal care.  She is currently monitored for the following issues for this high-risk pregnancy and has Supervision of high risk pregnancy, antepartum; Previous cesarean delivery, antepartum; Incompetent cervix in pregnancy, antepartum; AMA (advanced maternal age) multigravida 35+; and History of preterm premature rupture of membranes (PROM) in previous pregnancy, currently pregnant on her problem list.  Patient reports no complaints.  Contractions: Not present. Vag. Bleeding: None.  Movement: Present. Denies leaking of fluid.   The following portions of the patient's history were reviewed and updated as appropriate: allergies, current medications, past family history, past medical history, past social history, past surgical history and problem list. Problem list updated.  Objective:   Filed Vitals:   12/26/15 1027  BP: 100/72  Pulse: 80  Weight: 187 lb 3.2 oz (84.913 kg)    Fetal Status: Fetal Heart Rate (bpm): 150 Fundal Height: 25 cm Movement: Present     General:  Alert, oriented and cooperative. Patient is in no acute distress.  Skin: Skin is warm and dry. No rash noted.   Cardiovascular: Normal heart rate noted  Respiratory: Normal respiratory effort, no problems with respiration noted  Abdomen: Soft, gravid, appropriate for gestational age. Pain/Pressure: Absent     Pelvic: Vag. Bleeding: None     Cervical exam deferred        Extremities: Normal range of motion.  Edema: None  Mental Status: Normal mood and affect. Normal behavior. Normal judgment and thought content.   Urinalysis:     Urine results not available at discharge.     Assessment and Plan:  Pregnancy: [id number] at 1558w3d  1. Supervision of high risk pregnancy, antepartum, second trimester - Continue close observation  2. Incompetent cervix in pregnancy, antepartum, second trimester - Cerclage in place  3. History  of preterm premature rupture of membranes (PROM) in previous pregnancy, currently pregnant, second trimester - 17-p weekly  Preterm labor symptoms and general obstetric precautions including but not limited to vaginal bleeding, contractions, leaking of fluid and fetal movement were reviewed in detail with the patient. Please refer to After Visit Summary for other counseling recommendations.  Return in about 3 weeks (around 01/16/2016) for 17p weekly.   Eino FarberWalidah Kennith GainN Karim, CNM

## 2015-12-28 LAB — CULTURE, OB URINE: Colony Count: 75000

## 2016-01-01 ENCOUNTER — Ambulatory Visit (INDEPENDENT_AMBULATORY_CARE_PROVIDER_SITE_OTHER): Payer: Self-pay

## 2016-01-01 VITALS — BP 112/72 | HR 80

## 2016-01-01 DIAGNOSIS — O09292 Supervision of pregnancy with other poor reproductive or obstetric history, second trimester: Secondary | ICD-10-CM

## 2016-01-01 MED ORDER — HYDROXYPROGESTERONE CAPROATE 250 MG/ML IM OIL
250.0000 mg | TOPICAL_OIL | Freq: Once | INTRAMUSCULAR | Status: AC
  Administered 2016-01-01: 250 mg via INTRAMUSCULAR

## 2016-01-01 NOTE — Progress Notes (Signed)
Pt presented today for her 17-P injection. Pt tolerated well and will follow up next week for her next injection.

## 2016-01-06 ENCOUNTER — Encounter (HOSPITAL_COMMUNITY): Payer: Self-pay

## 2016-01-06 ENCOUNTER — Other Ambulatory Visit (HOSPITAL_COMMUNITY): Payer: Self-pay | Admitting: Obstetrics and Gynecology

## 2016-01-06 ENCOUNTER — Ambulatory Visit (HOSPITAL_COMMUNITY)
Admission: RE | Admit: 2016-01-06 | Discharge: 2016-01-06 | Disposition: A | Discharge status: Home / Self Care | Payer: Self-pay | Source: Ambulatory Visit | Attending: Obstetrics & Gynecology | Admitting: Obstetrics & Gynecology

## 2016-01-06 VITALS — BP 110/74 | HR 75 | Wt 194.2 lb

## 2016-01-06 DIAGNOSIS — O09292 Supervision of pregnancy with other poor reproductive or obstetric history, second trimester: Secondary | ICD-10-CM | POA: Insufficient documentation

## 2016-01-06 DIAGNOSIS — O3430 Maternal care for cervical incompetence, unspecified trimester: Secondary | ICD-10-CM

## 2016-01-06 DIAGNOSIS — O09522 Supervision of elderly multigravida, second trimester: Secondary | ICD-10-CM | POA: Insufficient documentation

## 2016-01-06 DIAGNOSIS — Z3A26 26 weeks gestation of pregnancy: Secondary | ICD-10-CM | POA: Insufficient documentation

## 2016-01-06 DIAGNOSIS — O34219 Maternal care for unspecified type scar from previous cesarean delivery: Secondary | ICD-10-CM

## 2016-01-06 DIAGNOSIS — O09529 Supervision of elderly multigravida, unspecified trimester: Secondary | ICD-10-CM

## 2016-01-06 DIAGNOSIS — O3432 Maternal care for cervical incompetence, second trimester: Secondary | ICD-10-CM | POA: Insufficient documentation

## 2016-01-06 DIAGNOSIS — O0992 Supervision of high risk pregnancy, unspecified, second trimester: Secondary | ICD-10-CM

## 2016-01-09 ENCOUNTER — Ambulatory Visit (INDEPENDENT_AMBULATORY_CARE_PROVIDER_SITE_OTHER): Payer: Self-pay

## 2016-01-09 DIAGNOSIS — Z8751 Personal history of pre-term labor: Secondary | ICD-10-CM

## 2016-01-09 DIAGNOSIS — O09212 Supervision of pregnancy with history of pre-term labor, second trimester: Secondary | ICD-10-CM

## 2016-01-09 MED ORDER — HYDROXYPROGESTERONE CAPROATE 250 MG/ML IM OIL
250.0000 mg | TOPICAL_OIL | INTRAMUSCULAR | Status: AC
  Administered 2016-01-09 – 2016-03-12 (×10): 250 mg via INTRAMUSCULAR

## 2016-01-16 ENCOUNTER — Ambulatory Visit (INDEPENDENT_AMBULATORY_CARE_PROVIDER_SITE_OTHER): Payer: Self-pay | Admitting: Obstetrics & Gynecology

## 2016-01-16 ENCOUNTER — Encounter: Payer: Self-pay | Admitting: Obstetrics & Gynecology

## 2016-01-16 VITALS — BP 118/70 | HR 81 | Wt 193.4 lb

## 2016-01-16 DIAGNOSIS — O09522 Supervision of elderly multigravida, second trimester: Secondary | ICD-10-CM

## 2016-01-16 DIAGNOSIS — O3433 Maternal care for cervical incompetence, third trimester: Secondary | ICD-10-CM

## 2016-01-16 DIAGNOSIS — O0993 Supervision of high risk pregnancy, unspecified, third trimester: Secondary | ICD-10-CM

## 2016-01-16 DIAGNOSIS — O09212 Supervision of pregnancy with history of pre-term labor, second trimester: Secondary | ICD-10-CM

## 2016-01-16 LAB — CBC
HEMATOCRIT: 36.9 % (ref 35.0–45.0)
HEMOGLOBIN: 12.6 g/dL (ref 11.7–15.5)
MCH: 30 pg (ref 27.0–33.0)
MCHC: 34.1 g/dL (ref 32.0–36.0)
MCV: 87.9 fL (ref 80.0–100.0)
MPV: 9.6 fL (ref 7.5–12.5)
Platelets: 271 10*3/uL (ref 140–400)
RBC: 4.2 MIL/uL (ref 3.80–5.10)
RDW: 12.7 % (ref 11.0–15.0)
WBC: 10.6 10*3/uL (ref 3.8–10.8)

## 2016-01-16 LAB — POCT URINALYSIS DIP (DEVICE)
Bilirubin Urine: NEGATIVE
Glucose, UA: NEGATIVE mg/dL
Ketones, ur: NEGATIVE mg/dL
Nitrite: NEGATIVE
Protein, ur: NEGATIVE mg/dL
SPECIFIC GRAVITY, URINE: 1.02 (ref 1.005–1.030)
UROBILINOGEN UA: 1 mg/dL (ref 0.0–1.0)
pH: 7 (ref 5.0–8.0)

## 2016-01-16 NOTE — Patient Instructions (Signed)
Informacin sobre el parto prematuro  (Preterm Labor Information) El parto prematuro comienza antes de la semana 37 de embarazo. La duracin de un embarazo normal es de 39 a 41 semanas.  CAUSAS  Generalmente no hay una causa que pueda identificarse del motivo por el que una mujer comienza un trabajo de parto prematuro. Sin embargo, una de las causas conocidas ms frecuentes son las infecciones. Las infecciones del tero, el cuello, la vagina, el lquido amnitico, la vejiga, los riones y hasta de los pulmones (neumona) pueden hacer que el trabajo de parto se inicie. Otras causas que pueden sospecharse son:   Infecciones urogenitales, como infecciones por hongos y vaginosis bacteriana.   Anormalidades uterinas (forma del tero, sptum uterino, fibromas, hemorragias en la placenta).   Un cuello que ha sido operado (puede ser que no permanezca cerrado).   Malformaciones del feto.   Gestaciones mltiples (mellizos, trillizos y ms).   Ruptura del saco amnitico.  FACTORES DE RIESGO   Historia previa de parto prematuro.   Tener ruptura prematura de las membranas (RPM).   La placenta cubre la abertura del cuello (placenta previa).   La placenta se separa del tero (abrupcin placentaria).   El cuello es demasiado dbil para contener al beb en el tero (cuello incompetente).   Hay mucho lquido en el saco amnitico (polihidramnios).   Consumo de drogas o hbito de fumar durante el embarazo.   No aumentar de peso lo suficiente durante el embarazo.   Mujeres menores de 18 aos o mayores de 35 aos.   Nivel socioeconmico bajo.   Pertenecer a la raza afroamericana. SNTOMAS  Los signos y sntomas del trabajo de parto prematuro son:   Clicos similares a los menstruales, dolor abdominal o dolor de espalda.  Contracciones uterinas regulares, tan frecuentes como seis por hora, sin importar su intensidad (pueden ser suaves o dolorosas).  Contracciones que comienzan  en la parte superior del tero y se expanden hacia abajo, a la zona inferior del abdomen y la espalda.   Sensacin de aumento de presin en la pelvis.   Aparece una secrecin acuosa o sanguinolenta por la vagina.  TRATAMIENTO  Segn el tiempo del embarazo y otras circunstancias, el mdico puede indicar reposo en cama. Si es necesario, le indicarn medicamentos para detener las contracciones y para madurar los pulmones del feto. Si el trabajo de parto se inicia antes de las 34 semanas de embarazo, se recomienda la hospitalizacin. El tratamiento depende de las condiciones en que se encuentren usted y el feto.  QU DEBE HACER SI PIENSA QUE EST EN TRABAJO DE PARTO PREMATURO?  Comunquese con su mdico inmediatamente. Debe concurrir al hospital para ser controlada inmediatamente.  CMO PUEDE EVITAR EL TRABAJO DE PARTO PREMATURO EN FUTUROS EMBARAZOS?  Usted debe:   Si fuma, abandonar el hbito.  Mantener un peso saludable y evitar sustancias qumicas y drogas innecesarias.  Controlar todo tipo de infeccin.  Informe a su mdico si tiene una historia conocida de trabajo de parto prematuro.   Esta informacin no tiene como fin reemplazar el consejo del mdico. Asegrese de hacerle al mdico cualquier pregunta que tenga.   Document Released: 10/27/2007 Document Revised: 03/22/2013 Elsevier Interactive Patient Education 2016 Elsevier Inc.  

## 2016-01-16 NOTE — Progress Notes (Signed)
28 wk labs today 28 wk packet given  Pt given info on tdap; will decide at next visit

## 2016-01-16 NOTE — Progress Notes (Signed)
Subjective:  Teresa Day is a 35 y.o. [id number] at 5461w3d being seen today for ongoing prenatal care.  She is currently monitored for the following issues for this high-risk pregnancy and has Supervision of high risk pregnancy, antepartum; Previous cesarean delivery, antepartum; Incompetent cervix in pregnancy, antepartum; AMA (advanced maternal age) multigravida 35+; and History of preterm premature rupture of membranes (PROM) in previous pregnancy, currently pregnant on her problem list.  Patient reports no complaints.  Contractions: Not present. Vag. Bleeding: None.  Movement: Present. Denies leaking of fluid.   The following portions of the patient's history were reviewed and updated as appropriate: allergies, current medications, past family history, past medical history, past social history, past surgical history and problem list. Problem list updated.  Objective:   Filed Vitals:   01/16/16 0917  BP: 118/70  Pulse: 81  Weight: 193 lb 6.4 oz (87.726 kg)    Fetal Status:     Movement: Present     General:  Alert, oriented and cooperative. Patient is in no acute distress.  Skin: Skin is warm and dry. No rash noted.   Cardiovascular: Normal heart rate noted  Respiratory: Normal respiratory effort, no problems with respiration noted  Abdomen: Soft, gravid, appropriate for gestational age. Pain/Pressure: Absent     Pelvic: Cervical exam deferred        Extremities: Normal range of motion.  Edema: None  Mental Status: Normal mood and affect. Normal behavior. Normal judgment and thought content.   Urinalysis: Urine Protein: Negative Urine Glucose: Negative  Assessment and Plan:  Pregnancy: [id number] at 5461w3d  1. AMA (advanced maternal age) multigravida 35+, second trimester  - Glucose Tolerance, 1 HR (50g) w/o Fasting - RPR - HIV antibody (with reflex) - CBC  2. Incompetent cervix in pregnancy, antepartum, third trimester Cerclage in place  3. Supervision of high risk  pregnancy, antepartum, third trimester 17 p weekly  Preterm labor symptoms and general obstetric precautions including but not limited to vaginal bleeding, contractions, leaking of fluid and fetal movement were reviewed in detail with the patient. Please refer to After Visit Summary for other counseling recommendations.  Return in about 2 weeks (around 01/30/2016).   Adam PhenixJames G Yeraldin Litzenberger, MD

## 2016-01-17 LAB — HIV ANTIBODY (ROUTINE TESTING W REFLEX): HIV 1&2 Ab, 4th Generation: NONREACTIVE

## 2016-01-17 LAB — RPR

## 2016-01-18 LAB — GLUCOSE TOLERANCE, 1 HOUR (50G) W/O FASTING: GLUCOSE, 1 HR, GESTATIONAL: 109 mg/dL (ref <140)

## 2016-01-23 ENCOUNTER — Ambulatory Visit (INDEPENDENT_AMBULATORY_CARE_PROVIDER_SITE_OTHER): Payer: Self-pay

## 2016-01-23 VITALS — BP 110/69 | HR 72

## 2016-01-23 DIAGNOSIS — Z8751 Personal history of pre-term labor: Secondary | ICD-10-CM

## 2016-01-23 DIAGNOSIS — O09213 Supervision of pregnancy with history of pre-term labor, third trimester: Secondary | ICD-10-CM

## 2016-01-23 NOTE — Progress Notes (Signed)
Pt presented today to clinic today for her Makena injection. Patient tolerated well and will follow up at next office visit.

## 2016-01-30 ENCOUNTER — Ambulatory Visit (INDEPENDENT_AMBULATORY_CARE_PROVIDER_SITE_OTHER): Payer: Self-pay | Admitting: Family

## 2016-01-30 VITALS — BP 105/68 | HR 72 | Wt 193.0 lb

## 2016-01-30 DIAGNOSIS — O34219 Maternal care for unspecified type scar from previous cesarean delivery: Secondary | ICD-10-CM

## 2016-01-30 DIAGNOSIS — O09293 Supervision of pregnancy with other poor reproductive or obstetric history, third trimester: Secondary | ICD-10-CM

## 2016-01-30 DIAGNOSIS — O0993 Supervision of high risk pregnancy, unspecified, third trimester: Secondary | ICD-10-CM

## 2016-01-30 LAB — POCT URINALYSIS DIP (DEVICE)
BILIRUBIN URINE: NEGATIVE
Glucose, UA: NEGATIVE mg/dL
KETONES UR: NEGATIVE mg/dL
Nitrite: NEGATIVE
PH: 7 (ref 5.0–8.0)
PROTEIN: NEGATIVE mg/dL
Specific Gravity, Urine: 1.02 (ref 1.005–1.030)
Urobilinogen, UA: 0.2 mg/dL (ref 0.0–1.0)

## 2016-01-30 NOTE — Progress Notes (Signed)
Subjective:  Teresa Day is a 35 y.o. [phone] at 9251w3d being seen today for ongoing prenatal care.  She is currently monitored for the following issues for this high-risk pregnancy and has Supervision of high risk pregnancy, antepartum; Previous cesarean delivery, antepartum; Incompetent cervix in pregnancy, antepartum; AMA (advanced maternal age) multigravida 35+; and History of preterm premature rupture of membranes (PROM) in previous pregnancy, currently pregnant on her problem list.  Patient reports no complaints.  Contractions: Not present. Vag. Bleeding: None.  Movement: Present. Denies leaking of fluid.   The following portions of the patient's history were reviewed and updated as appropriate: allergies, current medications, past family history, past medical history, past social history, past surgical history and problem list. Problem list updated.  Objective:   Filed Vitals:   01/30/16 1126  BP: 105/68  Pulse: 72  Weight: 193 lb (87.544 kg)    Fetal Status: Fetal Heart Rate (bpm): 143 Fundal Height: 29 cm Movement: Present     General:  Alert, oriented and cooperative. Patient is in no acute distress.  Skin: Skin is warm and dry. No rash noted.   Cardiovascular: Normal heart rate noted  Respiratory: Normal respiratory effort, no problems with respiration noted  Abdomen: Soft, gravid, appropriate for gestational age. Pain/Pressure: Present     Pelvic: Cervical exam deferred        Extremities: Normal range of motion.  Edema: None  Mental Status: Normal mood and affect. Normal behavior. Normal judgment and thought content.   Urinalysis:    Protein neg Glucose neg  Assessment and Plan:  Pregnancy: [id number] at 8751w3d  1. Supervision of high risk pregnancy, antepartum, third trimester - Close monitoring  2. Previous cesarean delivery, antepartum - Desires repeat  3. History of preterm premature rupture of membranes (PROM) in previous pregnancy, currently pregnant,  third trimester - 17-p today  Preterm labor symptoms and general obstetric precautions including but not limited to vaginal bleeding, contractions, leaking of fluid and fetal movement were reviewed in detail with the patient. Please refer to After Visit Summary for other counseling recommendations.  Return in about 2 weeks (around 02/13/2016) for 17p weekly.   Eino FarberWalidah Kennith GainN Karim, CNM

## 2016-01-30 NOTE — Progress Notes (Signed)
Patient receive

## 2016-02-03 ENCOUNTER — Ambulatory Visit (HOSPITAL_COMMUNITY)
Admission: RE | Admit: 2016-02-03 | Discharge: 2016-02-03 | Disposition: A | Discharge status: Home / Self Care | Payer: Self-pay | Source: Ambulatory Visit | Attending: Obstetrics & Gynecology | Admitting: Obstetrics & Gynecology

## 2016-02-03 ENCOUNTER — Encounter (HOSPITAL_COMMUNITY): Payer: Self-pay

## 2016-02-03 DIAGNOSIS — O09523 Supervision of elderly multigravida, third trimester: Secondary | ICD-10-CM | POA: Insufficient documentation

## 2016-02-03 DIAGNOSIS — O09292 Supervision of pregnancy with other poor reproductive or obstetric history, second trimester: Secondary | ICD-10-CM

## 2016-02-03 DIAGNOSIS — Z3A30 30 weeks gestation of pregnancy: Secondary | ICD-10-CM | POA: Insufficient documentation

## 2016-02-03 DIAGNOSIS — O3433 Maternal care for cervical incompetence, third trimester: Secondary | ICD-10-CM | POA: Insufficient documentation

## 2016-02-03 DIAGNOSIS — O09293 Supervision of pregnancy with other poor reproductive or obstetric history, third trimester: Secondary | ICD-10-CM | POA: Insufficient documentation

## 2016-02-05 ENCOUNTER — Ambulatory Visit (INDEPENDENT_AMBULATORY_CARE_PROVIDER_SITE_OTHER): Payer: Self-pay | Admitting: *Deleted

## 2016-02-05 DIAGNOSIS — O09213 Supervision of pregnancy with history of pre-term labor, third trimester: Secondary | ICD-10-CM

## 2016-02-05 DIAGNOSIS — Z8751 Personal history of pre-term labor: Secondary | ICD-10-CM

## 2016-02-13 ENCOUNTER — Ambulatory Visit (INDEPENDENT_AMBULATORY_CARE_PROVIDER_SITE_OTHER): Payer: Self-pay | Admitting: *Deleted

## 2016-02-13 VITALS — BP 119/82 | HR 83 | Wt 198.9 lb

## 2016-02-13 DIAGNOSIS — Z8751 Personal history of pre-term labor: Secondary | ICD-10-CM

## 2016-02-13 DIAGNOSIS — O09213 Supervision of pregnancy with history of pre-term labor, third trimester: Secondary | ICD-10-CM

## 2016-02-20 ENCOUNTER — Ambulatory Visit (INDEPENDENT_AMBULATORY_CARE_PROVIDER_SITE_OTHER): Payer: Self-pay | Admitting: *Deleted

## 2016-02-20 DIAGNOSIS — O09213 Supervision of pregnancy with history of pre-term labor, third trimester: Secondary | ICD-10-CM

## 2016-02-20 MED ORDER — HYDROXYPROGESTERONE CAPROATE 250 MG/ML IM OIL: 250.0000 mg | TOPICAL_OIL | Freq: Once | INTRAMUSCULAR | Status: DC

## 2016-02-24 ENCOUNTER — Encounter: Payer: Self-pay | Admitting: Obstetrics & Gynecology

## 2016-02-27 ENCOUNTER — Ambulatory Visit (INDEPENDENT_AMBULATORY_CARE_PROVIDER_SITE_OTHER): Payer: Self-pay | Admitting: Obstetrics & Gynecology

## 2016-02-27 VITALS — BP 116/72 | HR 90 | Wt 205.8 lb

## 2016-02-27 DIAGNOSIS — O3433 Maternal care for cervical incompetence, third trimester: Secondary | ICD-10-CM

## 2016-02-27 DIAGNOSIS — O09293 Supervision of pregnancy with other poor reproductive or obstetric history, third trimester: Secondary | ICD-10-CM

## 2016-02-27 DIAGNOSIS — Z23 Encounter for immunization: Secondary | ICD-10-CM

## 2016-02-27 LAB — POCT URINALYSIS DIP (DEVICE)
Bilirubin Urine: NEGATIVE
GLUCOSE, UA: NEGATIVE mg/dL
KETONES UR: NEGATIVE mg/dL
Nitrite: NEGATIVE
PH: 6 (ref 5.0–8.0)
PROTEIN: NEGATIVE mg/dL
SPECIFIC GRAVITY, URINE: 1.02 (ref 1.005–1.030)
Urobilinogen, UA: 0.2 mg/dL (ref 0.0–1.0)

## 2016-02-27 MED ORDER — FLUCONAZOLE 150 MG PO TABS: 150.0000 mg | ORAL_TABLET | Freq: Once | ORAL | 0 refills | Status: AC

## 2016-02-27 MED ORDER — TETANUS-DIPHTH-ACELL PERTUSSIS 5-2.5-18.5 LF-MCG/0.5 IM SUSP
0.5000 mL | Freq: Once | INTRAMUSCULAR | Status: AC
  Administered 2016-02-27: 0.5 mL via INTRAMUSCULAR

## 2016-02-27 NOTE — Progress Notes (Signed)
Used Interpreter [employer]. C/o vaginal itching and thick white discharge. Large leukocytes noted on urinalysis.

## 2016-02-27 NOTE — Patient Instructions (Signed)
Informacin sobre el parto prematuro  (Preterm Labor Information) El parto prematuro comienza antes de la semana 37 de embarazo. La duracin de un embarazo normal es de 39 a 41 semanas.  CAUSAS  Generalmente no hay una causa que pueda identificarse del motivo por el que una mujer comienza un trabajo de parto prematuro. Sin embargo, una de las causas conocidas ms frecuentes son las infecciones. Las infecciones del tero, el cuello, la vagina, el lquido amnitico, la vejiga, los riones y hasta de los pulmones (neumona) pueden hacer que el trabajo de parto se inicie. Otras causas que pueden sospecharse son:   Infecciones urogenitales, como infecciones por hongos y vaginosis bacteriana.   Anormalidades uterinas (forma del tero, sptum uterino, fibromas, hemorragias en la placenta).   Un cuello que ha sido operado (puede ser que no permanezca cerrado).   Malformaciones del feto.   Gestaciones mltiples (mellizos, trillizos y ms).   Ruptura del saco amnitico.  FACTORES DE RIESGO   Historia previa de parto prematuro.   Tener ruptura prematura de las membranas (RPM).   La placenta cubre la abertura del cuello (placenta previa).   La placenta se separa del tero (abrupcin placentaria).   El cuello es demasiado dbil para contener al beb en el tero (cuello incompetente).   Hay mucho lquido en el saco amnitico (polihidramnios).   Consumo de drogas o hbito de fumar durante el embarazo.   No aumentar de peso lo suficiente durante el embarazo.   Mujeres menores de 18 aos o mayores de 35 aos.   Nivel socioeconmico bajo.   Pertenecer a la raza afroamericana. SNTOMAS  Los signos y sntomas del trabajo de parto prematuro son:   Clicos similares a los menstruales, dolor abdominal o dolor de espalda.  Contracciones uterinas regulares, tan frecuentes como seis por hora, sin importar su intensidad (pueden ser suaves o dolorosas).  Contracciones que comienzan  en la parte superior del tero y se expanden hacia abajo, a la zona inferior del abdomen y la espalda.   Sensacin de aumento de presin en la pelvis.   Aparece una secrecin acuosa o sanguinolenta por la vagina.  TRATAMIENTO  Segn el tiempo del embarazo y otras circunstancias, el mdico puede indicar reposo en cama. Si es necesario, le indicarn medicamentos para detener las contracciones y para madurar los pulmones del feto. Si el trabajo de parto se inicia antes de las 34 semanas de embarazo, se recomienda la hospitalizacin. El tratamiento depende de las condiciones en que se encuentren usted y el feto.  QU DEBE HACER SI PIENSA QUE EST EN TRABAJO DE PARTO PREMATURO?  Comunquese con su mdico inmediatamente. Debe concurrir al hospital para ser controlada inmediatamente.  CMO PUEDE EVITAR EL TRABAJO DE PARTO PREMATURO EN FUTUROS EMBARAZOS?  Usted debe:   Si fuma, abandonar el hbito.  Mantener un peso saludable y evitar sustancias qumicas y drogas innecesarias.  Controlar todo tipo de infeccin.  Informe a su mdico si tiene una historia conocida de trabajo de parto prematuro.   Esta informacin no tiene como fin reemplazar el consejo del mdico. Asegrese de hacerle al mdico cualquier pregunta que tenga.   Document Released: 10/27/2007 Document Revised: 03/22/2013 Elsevier Interactive Patient Education 2016 Elsevier Inc.  

## 2016-02-27 NOTE — Progress Notes (Signed)
Subjective:  [place] is a 35 y.o. [phone] at [redacted]w[redacted]d being seen today for ongoing prenatal care.  She is currently monitored for the following issues for this high-risk pregnancy and has Supervision of high risk pregnancy, antepartum; Previous cesarean delivery, antepartum; Incompetent cervix in pregnancy, antepartum; AMA (advanced maternal age) multigravida 35+; and History of preterm premature rupture of membranes (PROM) in previous pregnancy, currently pregnant on her problem list.  Patient reports vaginal irritation and white d/c suspects yeast infection.  Contractions: Not present. Vag. Bleeding: None.  Movement: Present. Denies leaking of fluid.   The following portions of the patient's history were reviewed and updated as appropriate: allergies, current medications, past family history, past medical history, past social history, past surgical history and problem list. Problem list updated.  Objective:   Vitals:   02/27/16 0913  BP: 116/72  Pulse: 90  Weight: 205 lb 12.8 oz (93.4 kg)    Fetal Status: Fetal Heart Rate (bpm): 143 Fundal Height: 34 cm Movement: Present     General:  Alert, oriented and cooperative. Patient is in no acute distress.  Skin: Skin is warm and dry. No rash noted.   Cardiovascular: Normal heart rate noted  Respiratory: Normal respiratory effort, no problems with respiration noted  Abdomen: Soft, gravid, appropriate for gestational age. Pain/Pressure: Absent     Pelvic:  Cervical exam deferred        Extremities: Normal range of motion.  Edema: Trace  Mental Status: Normal mood and affect. Normal behavior. Normal judgment and thought content.   Urinalysis: Urine Protein: Negative Urine Glucose: Negative  Assessment and Plan:  Pregnancy: [id number] at [redacted]w[redacted]d  1. Incompetent cervix in pregnancy, antepartum, third trimester  - Tdap (BOOSTRIX) injection 0.5 mL; Inject 0.5 mLs into the muscle once.  2. History of preterm premature rupture of  membranes (PROM) in previous pregnancy, currently pregnant, third trimester  - Tdap (BOOSTRIX) injection 0.5 mL; Inject 0.5 mLs into the muscle once. Yeast vaginitis Preterm labor symptoms and general obstetric precautions including but not limited to vaginal bleeding, contractions, leaking of fluid and fetal movement were reviewed in detail with the patient. Please refer to After Visit Summary for other counseling recommendations.  2 week f/u  Adam Phenix, MD

## 2016-03-05 ENCOUNTER — Ambulatory Visit (INDEPENDENT_AMBULATORY_CARE_PROVIDER_SITE_OTHER): Payer: Self-pay | Admitting: *Deleted

## 2016-03-05 VITALS — BP 120/74 | HR 86 | Wt 205.0 lb

## 2016-03-05 DIAGNOSIS — O09893 Supervision of other high risk pregnancies, third trimester: Secondary | ICD-10-CM

## 2016-03-05 DIAGNOSIS — O09213 Supervision of pregnancy with history of pre-term labor, third trimester: Secondary | ICD-10-CM

## 2016-03-05 NOTE — Progress Notes (Signed)
Pt in for 17 P. She is feeling well, baby is moving.

## 2016-03-12 ENCOUNTER — Ambulatory Visit (INDEPENDENT_AMBULATORY_CARE_PROVIDER_SITE_OTHER): Payer: Self-pay | Admitting: Obstetrics & Gynecology

## 2016-03-12 ENCOUNTER — Ambulatory Visit: Payer: Self-pay

## 2016-03-12 DIAGNOSIS — O09213 Supervision of pregnancy with history of pre-term labor, third trimester: Secondary | ICD-10-CM

## 2016-03-12 DIAGNOSIS — O0993 Supervision of high risk pregnancy, unspecified, third trimester: Secondary | ICD-10-CM

## 2016-03-12 LAB — POCT URINALYSIS DIP (DEVICE)
Bilirubin Urine: NEGATIVE
Glucose, UA: NEGATIVE mg/dL
Ketones, ur: NEGATIVE mg/dL
Nitrite: NEGATIVE
Protein, ur: NEGATIVE mg/dL
Specific Gravity, Urine: 1.02 (ref 1.005–1.030)
Urobilinogen, UA: 0.2 mg/dL (ref 0.0–1.0)
pH: 7 (ref 5.0–8.0)

## 2016-03-12 NOTE — Progress Notes (Signed)
Subjective:  Teresa Day is a 35 y.o. [phone] at 5967w3d being seen today for ongoing prenatal care.  She is currently monitored for the following issues for this low-risk pregnancy and has Supervision of high risk pregnancy, antepartum; Previous cesarean delivery, antepartum; Incompetent cervix in pregnancy, antepartum; AMA (advanced maternal age) multigravida 35+; and History of preterm premature rupture of membranes (PROM) in previous pregnancy, currently pregnant on her problem list.  Patient reports back pain.  Contractions: Not present. Vag. Bleeding: None.  Movement: Present. Denies leaking of fluid.   The following portions of the patient's history were reviewed and updated as appropriate: allergies, current medications, past family history, past medical history, past social history, past surgical history and problem list. Problem list updated.  Objective:   Vitals:   03/12/16 1058  BP: 126/81  Pulse: 96  Weight: 210 lb 1.6 oz (95.3 kg)    Fetal Status: Fetal Heart Rate (bpm): 150 Fundal Height: 40 cm Movement: Present     General:  Alert, oriented and cooperative. Patient is in no acute distress.  Skin: Skin is warm and dry. No rash noted.   Cardiovascular: Normal heart rate noted  Respiratory: Normal respiratory effort, no problems with respiration noted  Abdomen: Soft, gravid, appropriate for gestational age. Pain/Pressure: Present     Pelvic:  Cervical exam performed        Extremities: Normal range of motion.  Edema: Trace  Mental Status: Normal mood and affect. Normal behavior. Normal judgment and thought content.   Urinalysis: Urine Protein: Negative Urine Glucose: Negative  Assessment and Plan:  Pregnancy: [id number] at 4567w3d  1. Supervision of high risk pregnancy, antepartum, third trimester -cerclage and rpt c/s scheduled for 39 weeks. -cultures next visit -17 P today   Term labor symptoms and general obstetric precautions including but not limited to  vaginal bleeding, contractions, leaking of fluid and fetal movement were reviewed in detail with the patient. Please refer to After Visit Summary for other counseling recommendations.  Return in about 1 week (around 03/19/2016).   Lesly DukesKelly H Leggett, MD

## 2016-03-13 ENCOUNTER — Encounter (HOSPITAL_COMMUNITY): Payer: Self-pay | Admitting: *Deleted

## 2016-03-19 ENCOUNTER — Ambulatory Visit (INDEPENDENT_AMBULATORY_CARE_PROVIDER_SITE_OTHER): Payer: Self-pay | Admitting: Family

## 2016-03-19 VITALS — BP 108/74 | HR 77 | Wt 211.8 lb

## 2016-03-19 DIAGNOSIS — O0993 Supervision of high risk pregnancy, unspecified, third trimester: Secondary | ICD-10-CM

## 2016-03-19 DIAGNOSIS — O09293 Supervision of pregnancy with other poor reproductive or obstetric history, third trimester: Secondary | ICD-10-CM

## 2016-03-19 DIAGNOSIS — O3433 Maternal care for cervical incompetence, third trimester: Secondary | ICD-10-CM

## 2016-03-19 DIAGNOSIS — O34219 Maternal care for unspecified type scar from previous cesarean delivery: Secondary | ICD-10-CM

## 2016-03-19 MED ORDER — HYDROXYPROGESTERONE CAPROATE 250 MG/ML IM OIL
250.0000 mg | TOPICAL_OIL | Freq: Once | INTRAMUSCULAR | Status: AC
  Administered 2016-03-19: 250 mg via INTRAMUSCULAR

## 2016-03-19 NOTE — Progress Notes (Signed)
Subjective:  Teresa Day is a 35 y.o. [phone] at 7375w3d being seen today for ongoing prenatal care.  She is currently monitored for the following issues for this high-risk pregnancy and has Supervision of high risk pregnancy, antepartum; Previous cesarean delivery, antepartum; Incompetent cervix in pregnancy, antepartum; AMA (advanced maternal age) multigravida 35+; and History of preterm premature rupture of membranes (PROM) in previous pregnancy, currently pregnant on her problem list.  Patient reports no complaints.  Contractions: Not present. Vag. Bleeding: None.  Movement: Present. Denies leaking of fluid.   The following portions of the patient's history were reviewed and updated as appropriate: allergies, current medications, past family history, past medical history, past social history, past surgical history and problem list. Problem list updated.  Objective:   Vitals:   03/19/16 0849  BP: 108/74  Pulse: 77  Weight: 211 lb 12.8 oz (96.1 kg)    Fetal Status: Fetal Heart Rate (bpm): 138 Fundal Height: 40 cm Movement: Present     General:  Alert, oriented and cooperative. Patient is in no acute distress.  Skin: Skin is warm and dry. No rash noted.   Cardiovascular: Normal heart rate noted  Respiratory: Normal respiratory effort, no problems with respiration noted  Abdomen: Soft, gravid, appropriate for gestational age. Pain/Pressure: Present     Pelvic:  Cervical exam deferred        Extremities: Normal range of motion.  Edema: Trace  Mental Status: Normal mood and affect. Normal behavior. Normal judgment and thought content.   Urinalysis:     Urine results not available at discharge.    Assessment and Plan:  Pregnancy: [id number] at 7475w3d  1. Supervision of high risk pregnancy, antepartum, third trimester - GBS and GC/CT collected today  2. Incompetent cervix in pregnancy, antepartum, third trimester - Cerclage removal at 39 wks with csection  3. Previous  cesarean delivery, antepartum - Repeat csectin  4. History of preterm premature rupture of membranes (PROM) in previous pregnancy, currently pregnant, third trimester - Last 17-p today  Preterm labor symptoms and general obstetric precautions including but not limited to vaginal bleeding, contractions, leaking of fluid and fetal movement were reviewed in detail with the patient. Please refer to After Visit Summary for other counseling recommendations.  Return in about 1 week (around 03/26/2016).   Eino FarberWalidah Kennith GainN Karim, CNM

## 2016-03-19 NOTE — Addendum Note (Signed)
Addended by: Marlis EdelsonKARIM, Abran Gavigan N on: 03/19/2016 09:20 AM   Modules accepted: Orders

## 2016-03-19 NOTE — Addendum Note (Signed)
Addended by: Garret ReddishBARNES, Sarahi Borland M on: 03/19/2016 09:30 AM   Modules accepted: Orders

## 2016-03-20 LAB — GC/CHLAMYDIA PROBE AMP (~~LOC~~) NOT AT ARMC
Chlamydia: NEGATIVE
Neisseria Gonorrhea: NEGATIVE

## 2016-03-22 LAB — CULTURE, BETA STREP (GROUP B ONLY)

## 2016-03-25 ENCOUNTER — Encounter (HOSPITAL_COMMUNITY): Payer: Self-pay

## 2016-03-25 NOTE — Pre-Procedure Instructions (Signed)
Interpreter number [phone]

## 2016-03-26 ENCOUNTER — Encounter: Payer: Self-pay | Admitting: *Deleted

## 2016-03-26 ENCOUNTER — Ambulatory Visit (INDEPENDENT_AMBULATORY_CARE_PROVIDER_SITE_OTHER): Payer: Self-pay | Admitting: Family Medicine

## 2016-03-26 ENCOUNTER — Other Ambulatory Visit: Payer: Self-pay | Admitting: Family Medicine

## 2016-03-26 VITALS — BP 119/86 | HR 83 | Wt 213.2 lb

## 2016-03-26 DIAGNOSIS — O3433 Maternal care for cervical incompetence, third trimester: Secondary | ICD-10-CM

## 2016-03-26 DIAGNOSIS — O34219 Maternal care for unspecified type scar from previous cesarean delivery: Secondary | ICD-10-CM

## 2016-03-26 DIAGNOSIS — O99820 Streptococcus B carrier state complicating pregnancy: Secondary | ICD-10-CM | POA: Insufficient documentation

## 2016-03-26 DIAGNOSIS — O09522 Supervision of elderly multigravida, second trimester: Secondary | ICD-10-CM

## 2016-03-26 DIAGNOSIS — O0993 Supervision of high risk pregnancy, unspecified, third trimester: Secondary | ICD-10-CM

## 2016-03-26 LAB — POCT URINALYSIS DIP (DEVICE)
Bilirubin Urine: NEGATIVE
GLUCOSE, UA: NEGATIVE mg/dL
KETONES UR: NEGATIVE mg/dL
Nitrite: NEGATIVE
PH: 6 (ref 5.0–8.0)
PROTEIN: NEGATIVE mg/dL
SPECIFIC GRAVITY, URINE: 1.025 (ref 1.005–1.030)
UROBILINOGEN UA: 0.2 mg/dL (ref 0.0–1.0)

## 2016-03-26 NOTE — Progress Notes (Signed)
Subjective:  Teresa Day is a 35 y.o. [phone] at 7754w3d being seen today for ongoing prenatal care.  She is currently monitored for the following issues for this high-risk pregnancy and has Supervision of high risk pregnancy, antepartum; Previous cesarean delivery, antepartum; Incompetent cervix in pregnancy, antepartum; AMA (advanced maternal age) multigravida 35+; and History of preterm premature rupture of membranes (PROM) in previous pregnancy, currently pregnant on her problem list.  Patient reports heartburn - mild.  Contractions: Irregular. Vag. Bleeding: None.  Movement: Present. Denies leaking of fluid.   The following portions of the patient's history were reviewed and updated as appropriate: allergies, current medications, past family history, past medical history, past social history, past surgical history and problem list. Problem list updated.  Objective:   Vitals:   03/26/16 0839  BP: 119/86  Pulse: 83  Weight: 213 lb 3.2 oz (96.7 kg)    Fetal Status: Fetal Heart Rate (bpm): 130   Movement: Present     General:  Alert, oriented and cooperative. Patient is in no acute distress.  Skin: Skin is warm and dry. No rash noted.   Cardiovascular: Normal heart rate noted  Respiratory: Normal respiratory effort, no problems with respiration noted  Abdomen: Soft, gravid, appropriate for gestational age. Pain/Pressure: Present     Pelvic:  Cervical exam deferred        Extremities: Normal range of motion.  Edema: Trace  Mental Status: Normal mood and affect. Normal behavior. Normal judgment and thought content.   Urinalysis: Urine Protein: Negative Urine Glucose: Negative  Assessment and Plan:  Pregnancy: [id number] at 2854w3d  1. Supervision of high risk pregnancy, antepartum, third trimester FHT normal   2. Incompetent cervix in pregnancy, antepartum, third trimester Cerclage in place - will remove in OR  3. Previous cesarean delivery, antepartum Repeat cesarean  scheduled for 9/5.  4. AMA (advanced maternal age) multigravida 35+, second trimester  Term labor symptoms and general obstetric precautions including but not limited to vaginal bleeding, contractions, leaking of fluid and fetal movement were reviewed in detail with the patient. Please refer to After Visit Summary for other counseling recommendations.  Return in about 1 week (around 04/02/2016) for HR OB f/u.   Levie HeritageJacob J Jeryn Cerney, DO

## 2016-03-26 NOTE — Progress Notes (Signed)
Used Interpreter [employer].  c/o some edema worse in mornings in hands/ feet. Small hemoglobin and large leukocytes noted in urinalysis.

## 2016-04-03 ENCOUNTER — Encounter (HOSPITAL_COMMUNITY)
Admission: RE | Admit: 2016-04-03 | Discharge: 2016-04-03 | Disposition: A | Discharge status: Home / Self Care | Payer: Medicaid Other | Source: Ambulatory Visit | Attending: Family Medicine | Admitting: Family Medicine

## 2016-04-03 ENCOUNTER — Encounter (HOSPITAL_COMMUNITY): Payer: Self-pay

## 2016-04-03 ENCOUNTER — Ambulatory Visit (INDEPENDENT_AMBULATORY_CARE_PROVIDER_SITE_OTHER): Payer: Self-pay | Admitting: Family Medicine

## 2016-04-03 VITALS — BP 118/80 | HR 75 | Temp 97.4°F | Wt 213.0 lb

## 2016-04-03 DIAGNOSIS — O0993 Supervision of high risk pregnancy, unspecified, third trimester: Secondary | ICD-10-CM

## 2016-04-03 DIAGNOSIS — O34219 Maternal care for unspecified type scar from previous cesarean delivery: Secondary | ICD-10-CM

## 2016-04-03 DIAGNOSIS — O09523 Supervision of elderly multigravida, third trimester: Secondary | ICD-10-CM

## 2016-04-03 LAB — POCT URINALYSIS DIP (DEVICE)
BILIRUBIN URINE: NEGATIVE
Glucose, UA: NEGATIVE mg/dL
Ketones, ur: NEGATIVE mg/dL
NITRITE: NEGATIVE
PH: 6 (ref 5.0–8.0)
PROTEIN: NEGATIVE mg/dL
Specific Gravity, Urine: 1.015 (ref 1.005–1.030)
Urobilinogen, UA: 0.2 mg/dL (ref 0.0–1.0)

## 2016-04-03 MED ORDER — BUPIVACAINE 0.5 % ON-Q PUMP SINGLE CATH 300 ML: 300.0000 mL | INJECTION | Status: DC

## 2016-04-03 NOTE — Patient Instructions (Signed)
Instrucciones Para Antes de la Ciruga   Su ciruga est programada para 04/07/2016  Entre por la entrada principal [facility]  a las [address] [place]     Levante el telfono, [place] [phone] para informarnos de su llegada. (pick up phone, dial [phone] on arrival)     Por favor llame al [phone] si tiene algn problema en la maana de la ciruga (please call  if you have any problems the morning of surgery.)                  Recuerde: (Remember)  No coma alimentos despues a la media (Do not eat food     No tome lquidos claros. (Do not drink clear liquids despues a la media   No use joyas, maquillaje de ojos, lpiz labial, crema para el cuerpo o esmalte de uas oscuro. (Do not wear jewelry, eye makeup, lipstick, body lotion, or dark fingernail polish). Puede usar desodorante (you may wear deodorant)    No se afeite 48 horas de su ciruga. (Do not shave 48 hours before your surgery)    No traiga objetos de valor al hospital.  [facility] no se hace responsable de ninguna pertenencia, ni objetos de valor que haya trado al hospital. (Do not bring valuable to the hospital.  [facility] is not responsible for any belongings or valuables brought to the hospital)   [facility] estas medicinas en la maana de la ciruga con un SORBITO de agua  nada (take these meds the morning of surgery with a SIP of water)     Durante la ciruga no se pueden usar lentes de contacto, dentaduras o puentes. (Contacts, dentures or bridgework cannot be worn in surgery).   Si va a ser ingresado despus de la ciruga, deje la [employer] Corporationmaleta en el carro hasta que se le haya asignado una habitacin. (If you are to be admitted after surgery, leave suitcase in car until your room has been assigned.)   A los pacientes que se les d de alta el mismo da no se les permitir manejar a casa.  (Patients discharged on the day of surgery will not be allowed to drive home)    [place] de telfono del Engineer, productionconductor nada (Name and telephone number of your driver)   Instrucciones especiales N/A (Special Instructions)   Por favor, lea las hojas informativas que le entregaron. (Please read over the following fact sheets that you were given) Surgical Site Infection Prevention

## 2016-04-03 NOTE — Progress Notes (Signed)
   PRENATAL VISIT NOTE  Subjective:  Teresa Day is a 35 y.o. [phone] at 3686w4d being seen today for ongoing prenatal care.  She is currently monitored for the following issues for this high-risk pregnancy and has Supervision of high risk pregnancy, antepartum; Previous cesarean delivery, antepartum; Incompetent cervix in pregnancy, antepartum; AMA (advanced maternal age) multigravida 35+; History of preterm premature rupture of membranes (PROM) in previous pregnancy, currently pregnant; and Group B Streptococcus carrier, antepartum on her problem list.  Patient reports no complaints.  Contractions: Irregular. Vag. Bleeding: None.  Movement: Present. Denies leaking of fluid.   The following portions of the patient's history were reviewed and updated as appropriate: allergies, current medications, past family history, past medical history, past social history, past surgical history and problem list. Problem list updated.  Objective:   Vitals:   04/03/16 1028  BP: 118/80  Pulse: 75  Temp: 97.4 F (36.3 C)  Weight: 213 lb (96.6 kg)    Fetal Status: Fetal Heart Rate (bpm): 141   Movement: Present     General:  Alert, oriented and cooperative. Patient is in no acute distress.  Skin: Skin is warm and dry. No rash noted.   Cardiovascular: Normal heart rate noted  Respiratory: Normal respiratory effort, no problems with respiration noted  Abdomen: Soft, gravid, appropriate for gestational age. Pain/Pressure: Present     Pelvic:  Cervical exam deferred        Extremities: Normal range of motion.  Edema: Trace  Mental Status: Normal mood and affect. Normal behavior. Normal judgment and thought content.   Urinalysis: Urine Protein: Negative Urine Glucose: Negative  Assessment and Plan:  Pregnancy: [id number] at 4886w4d  1. Supervision of high risk pregnancy, antepartum, third trimester FH and FHT normal  2. Previous cesarean delivery, antepartum Repeat on Tuesday  3. AMA  (advanced maternal age) multigravida 35+, third trimester   Term labor symptoms and general obstetric precautions including but not limited to vaginal bleeding, contractions, leaking of fluid and fetal movement were reviewed in detail with the patient. Please refer to After Visit Summary for other counseling recommendations.  Return in about 5 weeks (around 05/08/2016) for Postpartum Exam.  Levie HeritageJacob J Taydem Cavagnaro, DO

## 2016-04-03 NOTE — Progress Notes (Signed)
Large leukocytes in urine.  

## 2016-04-04 ENCOUNTER — Inpatient Hospital Stay (HOSPITAL_COMMUNITY)
Admission: AD | Admit: 2016-04-04 | Discharge: 2016-04-04 | Disposition: A | Discharge status: Home / Self Care | Payer: Self-pay | Source: Ambulatory Visit | Attending: Obstetrics & Gynecology | Admitting: Obstetrics & Gynecology

## 2016-04-04 DIAGNOSIS — Z01812 Encounter for preprocedural laboratory examination: Secondary | ICD-10-CM | POA: Insufficient documentation

## 2016-04-04 DIAGNOSIS — Z3A39 39 weeks gestation of pregnancy: Secondary | ICD-10-CM | POA: Insufficient documentation

## 2016-04-04 LAB — CBC
HEMATOCRIT: 35.7 % — AB (ref 36.0–46.0)
HEMOGLOBIN: 12.5 g/dL (ref 12.0–15.0)
MCH: 28.9 pg (ref 26.0–34.0)
MCHC: 35 g/dL (ref 30.0–36.0)
MCV: 82.4 fL (ref 78.0–100.0)
Platelets: 246 10*3/uL (ref 150–400)
RBC: 4.33 MIL/uL (ref 3.87–5.11)
RDW: 13.2 % (ref 11.5–15.5)
WBC: 8.5 10*3/uL (ref 4.0–10.5)

## 2016-04-05 LAB — RPR: RPR: NONREACTIVE

## 2016-04-07 ENCOUNTER — Inpatient Hospital Stay (HOSPITAL_COMMUNITY): Payer: Medicaid Other | Admitting: Anesthesiology

## 2016-04-07 ENCOUNTER — Encounter (HOSPITAL_COMMUNITY)
Admission: RE | Disposition: A | Discharge status: Home / Self Care | Payer: Self-pay | Source: Ambulatory Visit | Attending: Family Medicine

## 2016-04-07 ENCOUNTER — Encounter (HOSPITAL_COMMUNITY): Payer: Self-pay | Admitting: *Deleted

## 2016-04-07 ENCOUNTER — Inpatient Hospital Stay (HOSPITAL_COMMUNITY)
Admission: RE | Admit: 2016-04-07 | Discharge: 2016-04-09 | DRG: 765 | Disposition: A | Discharge status: Home / Self Care | Payer: Medicaid Other | Source: Ambulatory Visit | Attending: Family Medicine | Admitting: Family Medicine

## 2016-04-07 DIAGNOSIS — Z8249 Family history of ischemic heart disease and other diseases of the circulatory system: Secondary | ICD-10-CM | POA: Diagnosis not present

## 2016-04-07 DIAGNOSIS — O3433 Maternal care for cervical incompetence, third trimester: Secondary | ICD-10-CM | POA: Diagnosis present

## 2016-04-07 DIAGNOSIS — Z6841 Body Mass Index (BMI) 40.0 and over, adult: Secondary | ICD-10-CM

## 2016-04-07 DIAGNOSIS — O99214 Obesity complicating childbirth: Secondary | ICD-10-CM | POA: Diagnosis present

## 2016-04-07 DIAGNOSIS — Z3A39 39 weeks gestation of pregnancy: Secondary | ICD-10-CM

## 2016-04-07 DIAGNOSIS — O34211 Maternal care for low transverse scar from previous cesarean delivery: Secondary | ICD-10-CM | POA: Diagnosis present

## 2016-04-07 DIAGNOSIS — O09521 Supervision of elderly multigravida, first trimester: Secondary | ICD-10-CM

## 2016-04-07 DIAGNOSIS — O34219 Maternal care for unspecified type scar from previous cesarean delivery: Secondary | ICD-10-CM

## 2016-04-07 DIAGNOSIS — O99824 Streptococcus B carrier state complicating childbirth: Secondary | ICD-10-CM | POA: Diagnosis present

## 2016-04-07 DIAGNOSIS — Z98891 History of uterine scar from previous surgery: Secondary | ICD-10-CM

## 2016-04-07 DIAGNOSIS — O3430 Maternal care for cervical incompetence, unspecified trimester: Secondary | ICD-10-CM

## 2016-04-07 HISTORY — PX: CERVICAL CERCLAGE: SHX1329

## 2016-04-07 LAB — CBC
HCT: 34.6 % — ABNORMAL LOW (ref 36.0–46.0)
Hemoglobin: 12.3 g/dL (ref 12.0–15.0)
MCH: 29.2 pg (ref 26.0–34.0)
MCHC: 35.5 g/dL (ref 30.0–36.0)
MCV: 82.2 fL (ref 78.0–100.0)
PLATELETS: 230 10*3/uL (ref 150–400)
RBC: 4.21 MIL/uL (ref 3.87–5.11)
RDW: 13.3 % (ref 11.5–15.5)
WBC: 14.6 10*3/uL — AB (ref 4.0–10.5)

## 2016-04-07 LAB — PREPARE RBC (CROSSMATCH)

## 2016-04-07 LAB — TYPE AND SCREEN
ABO/RH(D): B POS
Antibody Screen: NEGATIVE
UNIT DIVISION: 0
Unit division: 0

## 2016-04-07 SURGERY — Surgical Case
Anesthesia: Epidural | Site: Abdomen

## 2016-04-07 MED ORDER — OXYCODONE-ACETAMINOPHEN 5-325 MG PO TABS: 1.0000 | ORAL_TABLET | ORAL | Status: DC | PRN

## 2016-04-07 MED ORDER — MENTHOL 3 MG MT LOZG: 1.0000 | LOZENGE | OROMUCOSAL | Status: DC | PRN

## 2016-04-07 MED ORDER — NALBUPHINE HCL 10 MG/ML IJ SOLN
INTRAMUSCULAR | Status: AC
  Filled 2016-04-07: qty 1

## 2016-04-07 MED ORDER — TETANUS-DIPHTH-ACELL PERTUSSIS 5-2.5-18.5 LF-MCG/0.5 IM SUSP: 0.5000 mL | Freq: Once | INTRAMUSCULAR | Status: DC

## 2016-04-07 MED ORDER — PHENYLEPHRINE 8 MG IN D5W 100 ML (0.08MG/ML) PREMIX OPTIME
INJECTION | INTRAVENOUS | Status: DC | PRN
  Administered 2016-04-07: 60 ug/min via INTRAVENOUS

## 2016-04-07 MED ORDER — ACETAMINOPHEN 500 MG PO TABS
1000.0000 mg | ORAL_TABLET | Freq: Four times a day (QID) | ORAL | Status: AC
  Administered 2016-04-07 – 2016-04-08 (×3): 1000 mg via ORAL
  Filled 2016-04-07 (×3): qty 2

## 2016-04-07 MED ORDER — OXYCODONE-ACETAMINOPHEN 5-325 MG PO TABS: 2.0000 | ORAL_TABLET | ORAL | Status: DC | PRN

## 2016-04-07 MED ORDER — SENNOSIDES-DOCUSATE SODIUM 8.6-50 MG PO TABS
2.0000 | ORAL_TABLET | ORAL | Status: DC
  Administered 2016-04-07 – 2016-04-09 (×2): 2 via ORAL
  Filled 2016-04-07 (×2): qty 2

## 2016-04-07 MED ORDER — SIMETHICONE 80 MG PO CHEW
80.0000 mg | CHEWABLE_TABLET | Freq: Three times a day (TID) | ORAL | Status: DC
  Administered 2016-04-07 – 2016-04-09 (×5): 80 mg via ORAL
  Filled 2016-04-07 (×5): qty 1

## 2016-04-07 MED ORDER — LACTATED RINGERS IV SOLN
INTRAVENOUS | Status: DC
  Administered 2016-04-07: 22:00:00 via INTRAVENOUS

## 2016-04-07 MED ORDER — NALOXONE HCL 2 MG/2ML IJ SOSY
1.0000 ug/kg/h | PREFILLED_SYRINGE | INTRAVENOUS | Status: DC | PRN
  Filled 2016-04-07: qty 2

## 2016-04-07 MED ORDER — NALBUPHINE HCL 10 MG/ML IJ SOLN
5.0000 mg | INTRAMUSCULAR | Status: DC | PRN
  Administered 2016-04-07: 5 mg via INTRAVENOUS

## 2016-04-07 MED ORDER — DIPHENHYDRAMINE HCL 25 MG PO CAPS
25.0000 mg | ORAL_CAPSULE | ORAL | Status: DC | PRN
  Filled 2016-04-07: qty 1

## 2016-04-07 MED ORDER — BUPIVACAINE IN DEXTROSE 0.75-8.25 % IT SOLN
INTRATHECAL | Status: AC
  Filled 2016-04-07: qty 2

## 2016-04-07 MED ORDER — WITCH HAZEL-GLYCERIN EX PADS: 1.0000 "application " | MEDICATED_PAD | CUTANEOUS | Status: DC | PRN

## 2016-04-07 MED ORDER — COCONUT OIL OIL: 1.0000 "application " | TOPICAL_OIL | Status: DC | PRN

## 2016-04-07 MED ORDER — MORPHINE SULFATE (PF) 0.5 MG/ML IJ SOLN
INTRAMUSCULAR | Status: DC | PRN
  Administered 2016-04-07: .2 mg via INTRATHECAL

## 2016-04-07 MED ORDER — LACTATED RINGERS IV SOLN
INTRAVENOUS | Status: DC | PRN
  Administered 2016-04-07: 40 [IU] via INTRAVENOUS

## 2016-04-07 MED ORDER — SCOPOLAMINE 1 MG/3DAYS TD PT72
1.0000 | MEDICATED_PATCH | TRANSDERMAL | Status: DC
  Administered 2016-04-07: 1.5 mg via TRANSDERMAL

## 2016-04-07 MED ORDER — BUPIVACAINE HCL (PF) 0.25 % IJ SOLN
INTRAMUSCULAR | Status: DC | PRN
  Administered 2016-04-07: 10 mL

## 2016-04-07 MED ORDER — KETOROLAC TROMETHAMINE 30 MG/ML IJ SOLN: 30.0000 mg | Freq: Four times a day (QID) | INTRAMUSCULAR | Status: AC | PRN

## 2016-04-07 MED ORDER — LACTATED RINGERS IV SOLN
Freq: Once | INTRAVENOUS | Status: AC
  Administered 2016-04-07 (×4): via INTRAVENOUS

## 2016-04-07 MED ORDER — DIPHENHYDRAMINE HCL 25 MG PO CAPS: 25.0000 mg | ORAL_CAPSULE | Freq: Four times a day (QID) | ORAL | Status: DC | PRN

## 2016-04-07 MED ORDER — NALBUPHINE HCL 10 MG/ML IJ SOLN: 5.0000 mg | INTRAMUSCULAR | Status: DC | PRN

## 2016-04-07 MED ORDER — DIBUCAINE 1 % RE OINT: 1.0000 "application " | TOPICAL_OINTMENT | RECTAL | Status: DC | PRN

## 2016-04-07 MED ORDER — FLUCONAZOLE 150 MG PO TABS
150.0000 mg | ORAL_TABLET | Freq: Once | ORAL | Status: AC
  Administered 2016-04-07: 150 mg via ORAL
  Filled 2016-04-07: qty 1

## 2016-04-07 MED ORDER — FENTANYL CITRATE (PF) 100 MCG/2ML IJ SOLN
INTRAMUSCULAR | Status: AC
  Filled 2016-04-07: qty 2

## 2016-04-07 MED ORDER — SODIUM CHLORIDE 0.9 % IR SOLN
Status: DC | PRN
  Administered 2016-04-07: 1

## 2016-04-07 MED ORDER — BUPIVACAINE 0.5 % ON-Q PUMP SINGLE CATH 300 ML
300.0000 mL | INJECTION | Status: DC
  Filled 2016-04-07: qty 300

## 2016-04-07 MED ORDER — BUPIVACAINE IN DEXTROSE 0.75-8.25 % IT SOLN
INTRATHECAL | Status: DC | PRN
  Administered 2016-04-07: 1.6 mg via INTRATHECAL

## 2016-04-07 MED ORDER — MORPHINE SULFATE (PF) 0.5 MG/ML IJ SOLN
INTRAMUSCULAR | Status: AC
  Filled 2016-04-07: qty 10

## 2016-04-07 MED ORDER — OXYTOCIN 10 UNIT/ML IJ SOLN
INTRAMUSCULAR | Status: AC
  Filled 2016-04-07: qty 4

## 2016-04-07 MED ORDER — SODIUM CHLORIDE 0.9% FLUSH: 3.0000 mL | INTRAVENOUS | Status: DC | PRN

## 2016-04-07 MED ORDER — SIMETHICONE 80 MG PO CHEW
80.0000 mg | CHEWABLE_TABLET | ORAL | Status: DC
  Administered 2016-04-07 – 2016-04-09 (×2): 80 mg via ORAL
  Filled 2016-04-07 (×2): qty 1

## 2016-04-07 MED ORDER — NALBUPHINE HCL 10 MG/ML IJ SOLN: 5.0000 mg | Freq: Once | INTRAMUSCULAR | Status: AC | PRN

## 2016-04-07 MED ORDER — CEFAZOLIN SODIUM-DEXTROSE 2-4 GM/100ML-% IV SOLN
2.0000 g | INTRAVENOUS | Status: AC
  Administered 2016-04-07: 2 g via INTRAVENOUS

## 2016-04-07 MED ORDER — SODIUM CHLORIDE 0.9 % IV SOLN: Freq: Once | INTRAVENOUS | Status: DC

## 2016-04-07 MED ORDER — PHENYLEPHRINE 8 MG IN D5W 100 ML (0.08MG/ML) PREMIX OPTIME
INJECTION | INTRAVENOUS | Status: AC
  Filled 2016-04-07: qty 100

## 2016-04-07 MED ORDER — FENTANYL CITRATE (PF) 100 MCG/2ML IJ SOLN: 25.0000 ug | INTRAMUSCULAR | Status: DC | PRN

## 2016-04-07 MED ORDER — OXYTOCIN 40 UNITS IN LACTATED RINGERS INFUSION - SIMPLE MED: 2.5000 [IU]/h | INTRAVENOUS | Status: AC

## 2016-04-07 MED ORDER — LACTATED RINGERS IV SOLN: 125.0000 mL/h | INTRAVENOUS | Status: DC

## 2016-04-07 MED ORDER — FENTANYL CITRATE (PF) 100 MCG/2ML IJ SOLN
INTRAMUSCULAR | Status: DC | PRN
  Administered 2016-04-07: 10 ug via INTRATHECAL

## 2016-04-07 MED ORDER — ONDANSETRON HCL 4 MG/2ML IJ SOLN: 4.0000 mg | Freq: Once | INTRAMUSCULAR | Status: DC | PRN

## 2016-04-07 MED ORDER — ACETAMINOPHEN 325 MG PO TABS: 650.0000 mg | ORAL_TABLET | ORAL | Status: DC | PRN

## 2016-04-07 MED ORDER — IBUPROFEN 600 MG PO TABS
600.0000 mg | ORAL_TABLET | Freq: Four times a day (QID) | ORAL | Status: DC
  Administered 2016-04-07 – 2016-04-09 (×8): 600 mg via ORAL
  Filled 2016-04-07 (×8): qty 1

## 2016-04-07 MED ORDER — BUPIVACAINE HCL (PF) 0.25 % IJ SOLN
INTRAMUSCULAR | Status: AC
  Filled 2016-04-07: qty 10

## 2016-04-07 MED ORDER — SIMETHICONE 80 MG PO CHEW
80.0000 mg | CHEWABLE_TABLET | ORAL | Status: DC | PRN
  Administered 2016-04-08: 80 mg via ORAL
  Filled 2016-04-07: qty 1

## 2016-04-07 MED ORDER — MEPERIDINE HCL 25 MG/ML IJ SOLN: 6.2500 mg | INTRAMUSCULAR | Status: DC | PRN

## 2016-04-07 MED ORDER — ONDANSETRON HCL 4 MG/2ML IJ SOLN
INTRAMUSCULAR | Status: DC | PRN
  Administered 2016-04-07: 4 mg via INTRAVENOUS

## 2016-04-07 MED ORDER — NALOXONE HCL 0.4 MG/ML IJ SOLN: 0.4000 mg | INTRAMUSCULAR | Status: DC | PRN

## 2016-04-07 MED ORDER — PRENATAL MULTIVITAMIN CH
1.0000 | ORAL_TABLET | Freq: Every day | ORAL | Status: DC
  Administered 2016-04-08 – 2016-04-09 (×2): 1 via ORAL
  Filled 2016-04-07 (×2): qty 1

## 2016-04-07 MED ORDER — MORPHINE SULFATE-NACL 0.5-0.9 MG/ML-% IV SOSY
PREFILLED_SYRINGE | INTRAVENOUS | Status: AC
  Filled 2016-04-07: qty 1

## 2016-04-07 MED ORDER — SODIUM CHLORIDE 0.9 % IJ SOLN
INTRAMUSCULAR | Status: AC
  Filled 2016-04-07: qty 10

## 2016-04-07 MED ORDER — SCOPOLAMINE 1 MG/3DAYS TD PT72: 1.0000 | MEDICATED_PATCH | Freq: Once | TRANSDERMAL | Status: DC

## 2016-04-07 MED ORDER — ZOLPIDEM TARTRATE 5 MG PO TABS: 5.0000 mg | ORAL_TABLET | Freq: Every evening | ORAL | Status: DC | PRN

## 2016-04-07 MED ORDER — ONDANSETRON HCL 4 MG/2ML IJ SOLN: 4.0000 mg | Freq: Three times a day (TID) | INTRAMUSCULAR | Status: DC | PRN

## 2016-04-07 MED ORDER — SCOPOLAMINE 1 MG/3DAYS TD PT72
MEDICATED_PATCH | TRANSDERMAL | Status: AC
  Administered 2016-04-07: 1.5 mg via TRANSDERMAL
  Filled 2016-04-07: qty 1

## 2016-04-07 MED ORDER — ONDANSETRON HCL 4 MG/2ML IJ SOLN
INTRAMUSCULAR | Status: AC
  Filled 2016-04-07: qty 2

## 2016-04-07 MED ORDER — DIPHENHYDRAMINE HCL 50 MG/ML IJ SOLN: 12.5000 mg | INTRAMUSCULAR | Status: DC | PRN

## 2016-04-07 MED ORDER — NALBUPHINE HCL 10 MG/ML IJ SOLN
5.0000 mg | Freq: Once | INTRAMUSCULAR | Status: AC | PRN
  Administered 2016-04-07: 5 mg via INTRAVENOUS

## 2016-04-07 SURGICAL SUPPLY — 36 items
ADH SKN CLS APL DERMABOND .7 (GAUZE/BANDAGES/DRESSINGS) ×2
APL SKNCLS STERI-STRIP NONHPOA (GAUZE/BANDAGES/DRESSINGS) ×2
BENZOIN TINCTURE PRP APPL 2/3 (GAUZE/BANDAGES/DRESSINGS) ×4 IMPLANT
CHLORAPREP W/TINT 26ML (MISCELLANEOUS) ×4 IMPLANT
CLOSURE STERI-STRIP 1/2X4 (GAUZE/BANDAGES/DRESSINGS) ×1
CLOSURE WOUND 1/2 X4 (GAUZE/BANDAGES/DRESSINGS) ×1
CLOTH BEACON ORANGE TIMEOUT ST (SAFETY) ×4 IMPLANT
CLSR STERI-STRIP ANTIMIC 1/2X4 (GAUZE/BANDAGES/DRESSINGS) ×1 IMPLANT
DERMABOND ADVANCED (GAUZE/BANDAGES/DRESSINGS) ×2
DERMABOND ADVANCED .7 DNX12 (GAUZE/BANDAGES/DRESSINGS) IMPLANT
DRSG OPSITE POSTOP 4X10 (GAUZE/BANDAGES/DRESSINGS) ×4 IMPLANT
ELECT REM PT RETURN 9FT ADLT (ELECTROSURGICAL) ×4
ELECTRODE REM PT RTRN 9FT ADLT (ELECTROSURGICAL) ×2 IMPLANT
GLOVE BIOGEL PI IND STRL 7.0 (GLOVE) ×4 IMPLANT
GLOVE BIOGEL PI IND STRL 7.5 (GLOVE) ×4 IMPLANT
GLOVE BIOGEL PI INDICATOR 7.0 (GLOVE) ×4
GLOVE BIOGEL PI INDICATOR 7.5 (GLOVE) ×4
GLOVE ECLIPSE 7.5 STRL STRAW (GLOVE) ×4 IMPLANT
GOWN STRL REUS W/TWL LRG LVL3 (GOWN DISPOSABLE) ×12 IMPLANT
NS IRRIG 1000ML POUR BTL (IV SOLUTION) ×4 IMPLANT
PACK C SECTION WH (CUSTOM PROCEDURE TRAY) ×4 IMPLANT
PAD ABD 7.5X8 STRL (GAUZE/BANDAGES/DRESSINGS) ×2 IMPLANT
PAD OB MATERNITY 4.3X12.25 (PERSONAL CARE ITEMS) ×4 IMPLANT
PENCIL SMOKE EVAC W/HOLSTER (ELECTROSURGICAL) ×4 IMPLANT
RTRCTR C-SECT PINK 25CM LRG (MISCELLANEOUS) ×4 IMPLANT
SPONGE DRAIN TRACH 4X4 STRL 2S (GAUZE/BANDAGES/DRESSINGS) ×4 IMPLANT
STRIP CLOSURE SKIN 1/2X4 (GAUZE/BANDAGES/DRESSINGS) ×3 IMPLANT
SUT PDS AB 0 CTX 36 PDP370T (SUTURE) ×2 IMPLANT
SUT VIC AB 0 CTX 36 (SUTURE) ×12
SUT VIC AB 0 CTX36XBRD ANBCTRL (SUTURE) ×6 IMPLANT
SUT VIC AB 2-0 CT1 27 (SUTURE) ×4
SUT VIC AB 2-0 CT1 TAPERPNT 27 (SUTURE) ×2 IMPLANT
SUT VIC AB 4-0 KS 27 (SUTURE) ×4 IMPLANT
SUT VICRYL 0 TIES 12 18 (SUTURE) ×2 IMPLANT
TOWEL OR 17X24 6PK STRL BLUE (TOWEL DISPOSABLE) ×4 IMPLANT
TRAY FOLEY CATH SILVER 14FR (SET/KITS/TRAYS/PACK) ×4 IMPLANT

## 2016-04-07 NOTE — Anesthesia Procedure Notes (Signed)
Epidural Patient location during procedure: OB  Staffing Anesthesiologist: Karie SchwalbeJUDD, Teresa Day Performed: anesthesiologist   Preanesthetic Checklist Completed: patient identified, site marked, surgical consent, pre-op evaluation, timeout performed, IV checked, risks and benefits discussed and monitors and equipment checked  Epidural Patient position: sitting Prep: site prepped and draped and DuraPrep Patient monitoring: continuous pulse ox and blood pressure Approach: midline Location: L3-L4 Injection technique: LOR saline  Needle:  Needle type: Tuohy  Needle gauge: 17 G Needle length: 9 cm and 9 Needle insertion depth: 6 cm Catheter type: closed end flexible Catheter size: 19 Gauge Catheter at skin depth: 10 cm Test dose: negative  Assessment Events: blood not aspirated, injection not painful, no injection resistance, negative IV test and no paresthesia  Additional Notes Patient identified. Risks/Benefits/Options discussed with patient including but not limited to bleeding, infection, nerve damage, paralysis, failed block, incomplete pain control, headache, blood pressure changes, nausea, vomiting, reactions to medication both or allergic, itching and postpartum back pain. Confirmed with bedside nurse the patient's most recent platelet count. Confirmed with patient that they are not currently taking any anticoagulation, have any bleeding history or any family history of bleeding disorders. Patient expressed understanding and wished to proceed. All questions were answered. Sterile technique was used throughout the entire procedure. Please see nursing notes for vital signs. Test dose was given through epidural catheter and negative prior to continuing to dose epidural or start infusion. Warning signs of high block given to the patient including shortness of breath, tingling/numbness in hands, complete motor block, or any concerning symptoms with instructions to call for help. Patient was given  instructions on fall risk and not to get out of bed. All questions and concerns addressed with instructions to call with any issues or inadequate analgesia.    This was a CSE. Intrathecal dosing after LOR. Catheter thread easily

## 2016-04-07 NOTE — Anesthesia Postprocedure Evaluation (Signed)
Anesthesia Post Note  Patient: [facility] Vazquez-Mosqueda  Procedure(s) Performed: Procedure(s) (LRB): CESAREAN SECTION (N/A) REMOVAL OF CERVICAL CERCLAGE (N/A)  Patient location during evaluation: Mother Baby Anesthesia Type: Combined Spinal/Epidural Level of consciousness: awake, awake and alert, oriented and patient cooperative Pain management: pain level controlled Vital Signs Assessment: post-procedure vital signs reviewed and stable Respiratory status: spontaneous breathing, nonlabored ventilation and respiratory function stable Cardiovascular status: stable Postop Assessment: patient able to bend at knees, no headache, no backache and no signs of nausea or vomiting Anesthetic complications: no     Last Vitals:  Vitals:   04/07/16 1235 04/07/16 1340  BP: (!) 107/54 118/71  Pulse: 64 60  Resp: 17 18  Temp: 36.7 C 36.7 C    Last Pain:  Vitals:   04/07/16 1340  TempSrc: Oral  PainSc: 0-No pain   Pain Goal: Patients Stated Pain Goal: 5 (04/07/16 0826)               Diane Hanel L

## 2016-04-07 NOTE — Transfer of Care (Signed)
Immediate Anesthesia Transfer of Care Note  Patient: Teresa Day  Procedure(s) Performed: Procedure(s): CESAREAN SECTION (N/A) REMOVAL OF CERVICAL CERCLAGE (N/A)  Patient Location: PACU  Anesthesia Type:Spinal  Level of Consciousness: awake  Airway & Oxygen Therapy: Patient Spontanous Breathing  Post-op Assessment: Report given to RN  Post vital signs: Reviewed and stable  Last Vitals:  Vitals:   04/07/16 0826  BP: (!) 131/93  Pulse: 79  Resp: 18  Temp: 36.8 C    Last Pain:  Vitals:   04/07/16 0826  TempSrc: Oral      Patients Stated Pain Goal: 5 (04/07/16 0826)  Complications: No apparent anesthesia complications

## 2016-04-07 NOTE — Anesthesia Preprocedure Evaluation (Signed)
Anesthesia Evaluation  Patient identified by MRN, date of birth, ID band Patient awake    Reviewed: Allergy & Precautions, NPO status , Patient's Chart, lab work & pertinent test results  History of Anesthesia Complications Negative for: history of anesthetic complications  Airway Mallampati: II  TM Distance: >3 FB Neck ROM: Full    Dental no notable dental hx. (+) Dental Advisory Given   Pulmonary neg pulmonary ROS,    Pulmonary exam normal breath sounds clear to auscultation       Cardiovascular negative cardio ROS Normal cardiovascular exam Rhythm:Regular Rate:Normal     Neuro/Psych negative neurological ROS  negative psych ROS   GI/Hepatic negative GI ROS, Neg liver ROS,   Endo/Other  Morbid obesity  Renal/GU negative Renal ROS  negative genitourinary   Musculoskeletal negative musculoskeletal ROS (+)   Abdominal   Peds negative pediatric ROS (+)  Hematology negative hematology ROS (+)   Anesthesia Other Findings   Reproductive/Obstetrics negative OB ROS                             Anesthesia Physical Anesthesia Plan  ASA: III  Anesthesia Plan: Epidural and Spinal   Post-op Pain Management:    Induction:   Airway Management Planned:   Additional Equipment:   Intra-op Plan:   Post-operative Plan:   Informed Consent: I have reviewed the patients History and Physical, chart, labs and discussed the procedure including the risks, benefits and alternatives for the proposed anesthesia with the patient or authorized representative who has indicated his/her understanding and acceptance.   Dental advisory given  Plan Discussed with: CRNA  Anesthesia Plan Comments:         Anesthesia Quick Evaluation

## 2016-04-07 NOTE — OR Nursing (Signed)
Used Leana RoeMarley Adams, spanish interpreter in [place].

## 2016-04-07 NOTE — Op Note (Signed)
Cesarean Section Operative Report  [facility]  04/07/2016  Indications: Scheduled Proceedure/Maternal Request and previous c-section does not desire vbac   Pre-operative Diagnosis: cpt [id number] - REPEAT c-section with removal of cerclage.   Post-operative Diagnosis: Same   Surgeon: Surgeon(s) and Role:    * Levie HeritageJacob J Stinson, DO - Primary    * Lorne SkeensNicholas Michael Nasia Cannan, MD   Assistants: none  Anesthesia: epidural    Estimated Blood Loss: 700 ml  Specimens: noen  Findings: Viable female infant in vertex presentation; Apgars 8 and 9;  clear amniotic fluid; intact placenta with three vessel cord; normal uterus, fallopian tubes and ovaries bilaterally. ADHESION on anterior aspect of uterus.   Baby condition / location:  Nursery   Complications: no complications  Indications: Teresa Day is a 35 y.o. [id number] with an IUP 9280w1d presenting for repeat c-section.  Procedure Details:  The patient was taken back to the operative suite where epidural anesthesia was placed.  A time out was held and the above information confirmed.   After induction of anesthesia, we attempted to remove cerclage, but could not get good visualization secondary to positioning. The patient was draped and prepped in the usual sterile manner and placed in a dorsal supine position with a leftward tilt. A Pfannenstiel incision was made and carried down through the subcutaneous tissue to the fascia. Fascial incision was made and sharly extended transversely. The fascia was separated from the underlying rectus tissue superiorly and inferiorly. The peritoneum was identified and bluntly entered and extended longitudinally. Alexis retractor was placed. A low transverse uterine incision was made and extended bluntly. Delivered from cephalic presentation was a viable infant with Apgars and as above.  After waiting 60 seconds for delayed cord cutting, the umbilical cord was clamped and cut cord blood was  obtained for evaluation. Cord ph was not sent. The placenta was removed Intact and appeared normal. The uterine outline, tubes and ovaries appeared normal. The uterine incision was closed with running locked sutures of 0 Vicryl with an imbricating layer of the same. 2 figure of eight sutures where placed to obtain hemostasis. An anterior adhesion was clamped with two Kelly's and tied with 0-vicyl ties. Hemostasis was observed.The trochar was used to place the On Q pump catheter below the fascia. The rectus was closed with 3-0 vircyl. The rectus muscles were examined and hemostasis observed. The fascia was then reapproximated with running sutures of 0 PDS. A total of 5 ml 0.5% Marcaine was injected through the on Q catheter. The subcuticular closure was performed using 0plain gut. The skin was closed with 3-0Vicryl.   Instrument, sponge, and needle counts were correct prior the abdominal closure and were correct at the conclusion of the case.   The patient was then placed in the lithotomy position. The cerclage was successfully visualized removed with no complication.  Disposition: PACU - hemodynamically stable.       SignedLes Pou: Teresa Turnbo SchenkMD 04/07/2016 11:09 AM

## 2016-04-07 NOTE — Lactation Note (Signed)
This note was copied from a baby's chart. Lactation Consultation Note Initial visit at 10 hours of age, with spanish interpreter, [place].  MOm reports she is unsure if baby is getting enough because she doesn't think baby is swallowing a lot.  Mom reports baby is showing feeding cues, stays latched for 20 minutes and then is content.  LC advised that is what a good feeding will look like at this time and to continue to look for swallows.  LC assisted with hand expression and several drops of colostrum noted and easily expressed.  Mom is able to return demonstration and is encouraged.  [facility] resources given and discussed.  Encouraged to feed with early cues on demand.  Early newborn behavior discussed.  Mom to call for assist as needed.    Patient Name: Teresa Day Today's Date: 04/07/2016 Reason for consult: Initial assessment   Maternal Data Has patient been taught Hand Expression?: Yes Does the patient have breastfeeding experience prior to this delivery?: Yes  Feeding Feeding Type: Breast Fed Length of feed: 20 min  LATCH Score/Interventions                Intervention(s): Breastfeeding basics reviewed;Position options     Lactation Tools Discussed/Used WIC Program: No (encouraged to call)   Consult Status Consult Status: Follow-up Date: 04/08/16 Follow-up type: In-patient    Jannifer RodneyShoptaw, Jana Lynn 04/07/2016, 8:08 PM

## 2016-04-07 NOTE — Anesthesia Postprocedure Evaluation (Signed)
Anesthesia Post Note  Patient: Teresa Day  Procedure(s) Performed: Procedure(s) (LRB): CESAREAN SECTION (N/A) REMOVAL OF CERVICAL CERCLAGE (N/A)  Patient location during evaluation: PACU Anesthesia Type: Spinal Level of consciousness: oriented and awake and alert Pain management: pain level controlled Vital Signs Assessment: post-procedure vital signs reviewed and stable Respiratory status: spontaneous breathing, respiratory function stable and patient connected to nasal cannula oxygen Cardiovascular status: blood pressure returned to baseline and stable Postop Assessment: no headache, no backache and spinal receding Anesthetic complications: no     Last Vitals:  Vitals:   04/07/16 1200 04/07/16 1215  BP: 111/78 114/81  Pulse: (!) 57 (!) 55  Resp: 17 12  Temp:      Last Pain:  Vitals:   04/07/16 1215  TempSrc:   PainSc: 0-No pain   Pain Goal: Patients Stated Pain Goal: 5 (04/07/16 0826)               Comfort Iversen JENNETTE

## 2016-04-07 NOTE — H&P (Signed)
Faculty Practice H&P  Teresa Day is a 35 y.o. female [phone] with IUP at 7473w1d presenting for repeat cesarean section with cerclage removal. Pregnancy was been complicated by Incompetent cervix, GCS, previous cesarean section, AMA.    Pt states she has been having no contractions, no vaginal bleeding, intact membranes, with normal fetal movement.     [facility]  with [facility] of care at 11weeks Pregnancy complications or risks: Patient Active Problem List   Diagnosis Date Noted  . Group B Streptococcus carrier, antepartum 03/26/2016  . History of preterm premature rupture of membranes (PROM) in previous pregnancy, currently pregnant 12/05/2015  . Supervision of high risk pregnancy, antepartum 09/23/2015  . Previous cesarean delivery, antepartum 09/23/2015  . Incompetent cervix in pregnancy, antepartum 09/23/2015  . AMA (advanced maternal age) multigravida 35+ 09/23/2015   She desires to oral contraceptives (estrogen/progesterone).  She plans to plans to breastfeed  Prenatal labs and studies: ABO, Rh: --/--/B POS (09/02 1100) Antibody: NEG (09/02 1100) Rubella: !Error! RPR: Non Reactive (09/02 1100)  HBsAg: NEGATIVE (02/20 1141)  HIV: NONREACTIVE (06/15 1412)  GBS:    1 hr Glucola 109 Genetic screening: Declined Anatomy US normal  Past Medical History:  Past Medical History:  Diagnosis Date  . Preterm labor     Past Surgical History:  Past Surgical History:  Procedure Laterality Date  . CERVICAL CERCLAGE  04/01/2012   Procedure: CERCLAGE CERVICAL;  Surgeon: Reva Boresanya S Pratt, MD;  Location: [facility];  Service: Gynecology;  Laterality: N/A;  . CERVICAL CERCLAGE N/A 09/22/2012   Procedure: CERCLAGE CERVICAL REMOVAL;  Surgeon: Adam PhenixJames G Arnold, MD;  Location: [facility];  Service: Obstetrics;  Laterality: N/A;  . CERVICAL CERCLAGE N/A 10/08/2015   Procedure: CERCLAGE CERVICAL;  Surgeon: Reva Boresanya S Pratt, MD;  Location: [facility];  Service: Gynecology;   Laterality: N/A;  . CESAREAN SECTION    . CESAREAN SECTION N/A 09/22/2012   Procedure: CESAREAN SECTION;  Surgeon: Adam PhenixJames G Arnold, MD;  Location: [facility];  Service: Obstetrics;  Laterality: N/A;  Repeat    Obstetrical History:  OB History    Gravida Para Term Preterm AB Living   6 5 3 2  0 3   SAB TAB Ectopic Multiple Live Births     0 0 0 4      Gynecological History:  OB History    Gravida Para Term Preterm AB Living   6 5 3 2  0 3   SAB TAB Ectopic Multiple Live Births     0 0 0 4      Social History:  Social History   Social History  . Marital status: Married    Spouse name: N/A  . Number of children: N/A  . Years of education: N/A   Social History Main Topics  . Smoking status: Never Smoker  . Smokeless tobacco: Never Used  . Alcohol use No  . Drug use: No  . Sexual activity: No   Other Topics Concern  . None   Social History Narrative  . None    Family History:  Family History  Problem Relation Age of Onset  . Hypertension Mother   . Hypertension Father   . Other Neg Hx     Medications:  Prenatal vitamins,  Current Facility-Administered Medications  Medication Dose Route Frequency Provider Last Rate Last Dose  . ceFAZolin (ANCEF) IVPB 2g/100 mL premix  2 g Intravenous On Call to OR Levie HeritageJacob J Esraa Seres, DO      .  lactated ringers infusion   Intravenous Once Karie Schwalbe, MD      . lactated ringers infusion  125 mL/hr Intravenous Continuous Rhona Raider Aerionna Moravek, DO      . scopolamine (TRANSDERM-SCOP) 1 MG/3DAYS 1.5 mg  1 patch Transdermal Q72H Karie Schwalbe, MD   1.5 mg at 04/07/16 0815    Allergies: No Known Allergies  Review of Systems: - neg  Physical Exam: Blood pressure (!) 131/93, pulse 79, temperature 98.3 F (36.8 C), temperature source Oral, resp. rate 18, last menstrual period 07/08/2015, SpO2 99 %, currently breastfeeding. GENERAL: Well-developed, well-nourished female in no acute distress.  LUNGS: Clear to auscultation bilaterally.  HEART: Regular  rate and rhythm. ABDOMEN: Soft, nontender, nondistended, gravid. EFW 8 lbs EXTREMITIES: Nontender, no edema, 2+ distal pulses. Presentation: cephalic FHT:  Baseline rate 142 bpm     Pertinent Labs/Studies:   CBC    Component Value Date/Time   WBC 8.5 04/04/2016 1100   RBC 4.33 04/04/2016 1100   HGB 12.5 04/04/2016 1100   HCT 35.7 (L) 04/04/2016 1100   PLT 246 04/04/2016 1100   MCV 82.4 04/04/2016 1100   MCH 28.9 04/04/2016 1100   MCHC 35.0 04/04/2016 1100   RDW 13.2 04/04/2016 1100   LYMPHSABS 2.3 09/23/2015 1141   MONOABS 0.4 09/23/2015 1141   EOSABS 0.1 09/23/2015 1141   BASOSABS 0.0 09/23/2015 1141     Assessment : [place] is a 35 y.o. [id number] at [redacted]w[redacted]d being admitted for cesarean section secondary to elective repeat  Plan: The risks of cesarean section discussed with the patient included but were not limited to: bleeding which may require transfusion or reoperation; infection which may require antibiotics; injury to bowel, bladder, ureters or other surrounding organs; injury to the fetus; need for additional procedures including hysterectomy in the event of a life-threatening hemorrhage; placental abnormalities wth subsequent pregnancies, incisional problems, thromboembolic phenomenon and other postoperative/anesthesia complications. The patient concurred with the proposed plan, giving informed written consent for the procedure.   Patient has been NPO since last night and will remain NPO for procedure.  Preoperative prophylactic Ancef ordered on call to the OR.    Levie Heritage, DO 04/07/2016, 8:59 AM

## 2016-04-08 ENCOUNTER — Encounter (HOSPITAL_COMMUNITY): Payer: Self-pay | Admitting: Family Medicine

## 2016-04-08 LAB — CBC
HCT: 32.8 % — ABNORMAL LOW (ref 36.0–46.0)
Hemoglobin: 11.4 g/dL — ABNORMAL LOW (ref 12.0–15.0)
MCH: 29 pg (ref 26.0–34.0)
MCHC: 34.8 g/dL (ref 30.0–36.0)
MCV: 83.5 fL (ref 78.0–100.0)
PLATELETS: 197 10*3/uL (ref 150–400)
RBC: 3.93 MIL/uL (ref 3.87–5.11)
RDW: 13.7 % (ref 11.5–15.5)
WBC: 11.2 10*3/uL — ABNORMAL HIGH (ref 4.0–10.5)

## 2016-04-08 LAB — RPR: RPR Ser Ql: NONREACTIVE

## 2016-04-08 LAB — BIRTH TISSUE RECOVERY COLLECTION (PLACENTA DONATION)

## 2016-04-08 NOTE — Progress Notes (Signed)
Patient ambulated out of bed with minimal to no assistance, tolerated very well. Walked to bathroom, pericare given, catheter removed and emptied for 350 of clear yellow urine. Walked to chair with no assistance.

## 2016-04-08 NOTE — Lactation Note (Signed)
This note was copied from a baby's chart. Lactation Consultation Note  Patient Name: Teresa Day Today's Date: 04/08/2016 Reason for consult: Follow-up assessment Bonnye FavaViria, in house Spanish interpreter present for visit. Mom reports baby is nursing well, she does report hearing some swallows, denies nipple tenderness. Baby is BF frequently, Mom worried if baby getting enough. Baby has been to breast 13 times in 24 hours nursing for 10-20 minutes. 6 voids/1 stool in 24 hours, 1 stool in life. Reassured Mom.  Basic teaching reviewed, advised baby should be at breast 8-12 times in 24 hours and with feeding ques, cluster feeding discussed. Encouraged Mom to call for assist with latch or for RN or LC to observe latch for latch score. Encouraged to call for questions/concerns.   Maternal Data    Feeding Feeding Type: Breast Fed Length of feed: 20 min  LATCH Score/Interventions                      Lactation Tools Discussed/Used     Consult Status Consult Status: Follow-up Date: 04/09/16 Follow-up type: In-patient    Alfred LevinsGranger, Pasqual Farias Ann 04/08/2016, 3:30 PM

## 2016-04-08 NOTE — Progress Notes (Signed)
POSTPARTUM PROGRESS NOTE  Post Operative Day 1 Subjective:  Teresa Day is a 35 y.o. [id number] 5658w1d s/p RLTCS.  No acute events overnight.  Pt denies problems with ambulating, voiding or po intake.  She denies nausea or vomiting.  Pain is well controlled.  She has had flatus. She has had bowel movement.  Lochia Minimal.   Objective: Blood pressure 124/67, pulse 74, temperature 98.1 F (36.7 C), resp. rate 18, last menstrual period 07/08/2015, SpO2 100 %, unknown if currently breastfeeding.  Physical Exam:  General: alert, cooperative and no distress Lochia:normal flow Chest: no respiratory distress Heart:regular rate, distal pulses intact Incision: Pressure dressing C/D/I Abdomen: soft, nontender,  Uterine Fundus: firm, appropriately tender DVT Evaluation: No calf swelling or tenderness Extremities: trace edema   Recent Labs  04/07/16 1327 04/08/16 0515  HGB 12.3 11.4*  HCT 34.6* 32.8*    Assessment/Plan:  ASSESSMENT: Teresa Day is a 35 y.o. [id number] 1458w1d s/p RLTCS  Plan for discharge tomorrow and Breastfeeding   LOS: 1 day   Les Pouicholas SchenkMD 04/08/2016, 7:37 AM

## 2016-04-08 NOTE — Progress Notes (Signed)
Subjective: Postpartum Day 1: Cesarean Delivery Patient reports incisional pain, tolerating PO, + flatus, + BM and no problems voiding.  Objective: Vital signs in last 24 hours: Temp:  [97.5 F (36.4 C)-98.8 F (37.1 C)] 98.1 F (36.7 C) (09/06 0400) Pulse Rate:  [54-81] 74 (09/06 0400) Resp:  [12-19] 18 (09/06 0400) BP: (98-131)/(54-93) 124/67 (09/06 0400) SpO2:  [94 %-100 %] 100 % (09/06 0400)  Physical Exam:  General: alert, cooperative and no distress Lochia: appropriate Uterine Fundus: firm Incision: healing well, no significant drainage, no dehiscence, no significant erythema DVT Evaluation: No evidence of DVT seen on physical exam.   Recent Labs  04/07/16 1327 04/08/16 0515  HGB 12.3 11.4*  HCT 34.6* 32.8*    Assessment/Plan: Status post Cesarean section. Breastfeeding well. Plans to use POPs for contraception. Doing well postoperatively.  Continue current care with plan to discharge tomorrow.   Ambrose FinlandGabriela E Reed 04/08/2016, 7:08 AM   OB FELLOW MEDICAL STUDENT NOTE ATTESTATION  I have seen and examined this patient. Note this is a Psychologist, occupationalmedical student note and as such does not necessarily reflect the patient's plan of care. Please see Ernestina Pennaicholas Boluwatife Flight MD's note for this date of service.    Ernestina Pennaicholas Saad Buhl 04/08/2016, 7:41 AM

## 2016-04-08 NOTE — Progress Notes (Signed)
UR chart review completed.  

## 2016-04-09 MED ORDER — IBUPROFEN 600 MG PO TABS: 600.0000 mg | ORAL_TABLET | Freq: Four times a day (QID) | ORAL | 0 refills | Status: DC

## 2016-04-09 MED ORDER — OXYCODONE-ACETAMINOPHEN 5-325 MG PO TABS: 1.0000 | ORAL_TABLET | Freq: Four times a day (QID) | ORAL | 0 refills | Status: DC | PRN

## 2016-04-09 NOTE — Lactation Note (Signed)
This note was copied from a baby's chart. Lactation Consultation Note Baby had 7% weight loss. Had 8 voids and 2 stools in 42 hrs. Mom is BF as well as formula supplementing small amount formula.   Patient Name: Teresa Day Today's Date: 04/09/2016 Reason for consult: Follow-up assessment;Infant weight loss   Maternal Data    Feeding    LATCH Score/Interventions                      Lactation Tools Discussed/Used     Consult Status Consult Status: Follow-up Date: 04/09/16 (in pm) Follow-up type: In-patient    Charyl DancerCARVER, Mahki Spikes G 04/09/2016, 4:40 AM

## 2016-04-09 NOTE — Discharge Instructions (Signed)
Parto por cesrea - Cuidados posteriores  (Cesarean Delivery, Care After) Siga estas instrucciones durante las prximas semanas. Estas indicaciones le proporcionan informacin general acerca de cmo deber cuidarse despus del procedimiento. El mdico tambin podr darle instrucciones ms especficas. El tratamiento se ha planificado de acuerdo a las prcticas mdicas actuales, pero a veces se producen problemas. Comunquese con el mdico si tiene algn problema o tiene dudas cuando vuelva a su casa.  INSTRUCCIONES PARA EL CUIDADO EN EL HOGAR  Tome slo medicamentos de venta libre o recetados, segn las indicaciones del mdico.  No beba alcohol, especialmente si est amamantando o toma analgsicos.  Nomastique tabaco ni fume.  Contine con un adecuado cuidado perineal. El buen cuidado perineal incluye:  Higienizarse de adelante hacia atrs.  Mantener la zona perineal limpia.  Controlar diariamente el corte (incisin) y observar si aumenta el enrojecimiento, si supura, se hincha o se separa la piel.  Limpie la incisin suavemente con jabn y agua todos los das, y luego squela dando golpecitos. Si el mdico la autoriza, deje la incisin al descubierto. Use un apsito (vendaje) si drena lquido o la incisin parece irritada. Si las pequeas tiras [facility] que cruzan la incisin no se caen dentro de los 7 das, retrelas suavemente.  Abrace una almohada al toser o estornudar hasta que la incisin se cure. Esto ayuda a Best boy.  No conduzca vehculos ni opere maquinarias hasta que el mdico la autorice.  Dchese, lvese el cabello y tome baos de inmersin segn las indicaciones de su mdico.  Utilice un sostn que le ajuste bien y que brinde buen soporte a sus Glass blower/designer.  Limite el uso de bombachas de sostn o medias panty.  Beba suficiente lquido para Consulting civil engineer orina clara o de color amarillo plido.  Consuma todos los das alimentos ricos en fibra como cereales y panes  [place], arroz, frijoles y frutas frescas y verduras. Estos alimentos pueden ayudarla a prevenir o Cytogeneticist.  Reanude las actividades como subir escaleras, conducir automviles, levantar objetos pesados, hacer ejercicios o viajar cuando le indique su mdico.  Hable con su mdico acerca de reanudar la actividad sexual. Volver a la actividad sexual depende del riesgo de infeccin, la velocidad de la curacin y la comodidad y su deseo de Financial controller.  Trate de que alguien la ayude con las actividades del hogar y con el recin nacido al menos durante algunos das despus de salir del hospital.  Descanse todo lo que pueda. Trate de descansar o tomar una siesta mientras el beb est durmiendo.  Aumente sus actividades gradualmente.  Cumpla con todos los controles programados para despus del [place]. Es muy importante asistir a todas las visitas de Nurse, adult. En estas visitas, su mdico va a controlarla para asegurarse de que est sanando fsica y emocionalmente. SOLICITE ATENCIN MDICA SI:   Elimina cogulos grandes por la vagina. Guarde algunos cogulos para mostrarle al mdico.  Tiene una secrecin con feo olor que proviene de la vagina.  Tiene dificultad para orinar.  Orina con frecuencia.  Siente dolor al [employer].  Nota un cambio en sus movimientos intestinales.  Aumenta el enrojecimiento, el dolor o la hinchazn en la zona de la incisin.  Observa que supura pus en la incisin.  La incisin se abre.  Sus [employer] duelen, estn duras o enrojecidas.  Sufre un dolor intenso de [place].  Tiene visin borrosa o ve manchas.  Se siente triste o deprimida.  Tiene pensamientos acerca de lastimarse o daar al  recién nacido. °· Tiene preguntas acerca de su cuidado, la atención del recién nacido o acerca de los medicamentos. °· Se siente mareada o sufre un desmayo. °· Tiene una erupción. °· Siente dolor u observa enrojecimiento o hinchazón en el sitio en que  estaba la vía intravenosa (IV). °· Tiene náuseas o vómitos. °· Usted dejó de amamantar al bebé y no ha tenido su período menstrual dentro de las 12 semanas siguientes. °· No amamanta al bebé y no tuvo su período menstrual en las últimas 12 semanas. °· Tiene fiebre. °SOLICITE ATENCIÓN MÉDICA DE INMEDIATO SI:  °· Siente dolor persistente. °· Siente dolor en el pecho. °· Le falta el aire. °· Se desmaya. °· Siente dolor en la pierna. °· Siente dolor en el estómago. °· El sangrado vaginal satura dos o más apósitos en 1 hora. °ASEGÚRESE DE QUE:  °· Comprende estas instrucciones. °· Controlará su enfermedad. °· Recibirá ayuda de inmediato si no mejora o si empeora. °  °Esta información no tiene como fin reemplazar el consejo del médico. Asegúrese de hacerle al médico cualquier pregunta que tenga. °  °Document Released: 07/20/2005 Document Revised: 08/10/2014 °Elsevier Interactive Patient Education ©2016 Elsevier Inc. ° °

## 2016-04-09 NOTE — Lactation Note (Signed)
This note was copied from a baby's chart. Lactation Consultation Note  Patient Name: Teresa Day Today's Date: 04/09/2016 Reason for consult: Follow-up assessment  Mom is a P4, and this is her 2nd child to breastfeed. Mom reports ample supply once her milk has come to volume & that w/her last child, she no longer gave formula supplementation once her milk came to volume. She breastfed that child for 7 months. Currntly, Dad is offering a bottle if infant shows hunger cues after a feeding.   Mom w/a compression stripe on her L nipple. Specifics of an asymmetric latch shown via [employer] Pro[employer] Gam[employer]KellyMom website animation. Latch observed on R side. Mom comfortable w/latch (R nipple is atraumatic).   Mom understands that she can offer the same breast more than once during a feeding session to facilitate her milk coming to volume.   Marly, interpreter, present during consult.  [name], [name] [facility] [place] 04/09/2016, 10:24 AM

## 2016-04-09 NOTE — Discharge Summary (Signed)
OB Discharge Summary     Patient Name: Teresa Day DOB: 07/27/1981 MRN: [id number]  Date of admission: 04/07/2016 Delivering MD: Levie Heritage   Date of discharge: 04/09/2016  Admitting diagnosis: cpt [id number] - REPEAT c-section with removal of cerclage Intrauterine pregnancy: 103w1d     Secondary diagnosis:  Active Problems:   Status post C-section  Additional problems: none     Discharge diagnosis: Term Pregnancy Delivered                                                                                                Post partum procedures:none  Augmentation: none  Complications: None  Hospital course:  Sceduled C/S   35 y.o. yo [id number] at [redacted]w[redacted]d was admitted to the hospital 04/07/2016 for scheduled cesarean section with the following indication:Elective Repeat. Membrane Rupture Time/Date: 9:55 AM ,04/07/2016   Patient delivered a Viable infant.04/07/2016  Details of operation can be found in separate operative note.  Pateint had an uncomplicated postpartum course.  She is ambulating, tolerating a regular diet, passing flatus, and urinating well. Patient is discharged home in stable condition on  04/09/16         Physical exam Vitals:   04/08/16 0900 04/08/16 1758 04/08/16 1950 04/09/16 0500  BP: 114/68 (!) 119/58  122/72  Pulse: 71 71  74  Resp: 19 18  18   Temp: 97.6 F (36.4 C) 97.8 F (36.6 C)  97.7 F (36.5 C)  TempSrc: Oral Oral  Oral  SpO2: 98%     Weight:   213 lb (96.6 kg)   Height:   4\' 10"  (1.473 m)    General: alert, cooperative and no distress Lochia: appropriate Uterine Fundus: firm Incision: Healing well with no significant drainage, No significant erythema, Dressing is clean, dry, and intact DVT Evaluation: No evidence of DVT seen on physical exam. Negative Homan's sign. No cords or calf tenderness. Labs: Lab Results  Component Value Date   WBC 11.2 (H) 04/08/2016   HGB 11.4 (L) 04/08/2016   HCT 32.8 (L) 04/08/2016   MCV 83.5 04/08/2016    PLT 197 04/08/2016   No flowsheet data found.  Discharge instruction: per After Visit Summary and "Baby and Me Booklet".  After visit meds:    Medication List    TAKE these medications   ibuprofen 600 MG tablet Commonly known as:  ADVIL,MOTRIN Take 1 tablet (600 mg total) by mouth every 6 (six) hours.   oxyCODONE-acetaminophen 5-325 MG tablet Commonly known as:  PERCOCET/ROXICET Take 1 tablet by mouth every 6 (six) hours as needed for severe pain.   Prenatal Vitamins 0.8 MG tablet Take 1 tablet by mouth daily.       Diet: routine diet  Activity: Advance as tolerated. Pelvic rest for 6 weeks.   Outpatient follow up:6 weeks Follow up Appt:Future Appointments Date Time Provider Department Center  05/20/2016 2:40 PM Aviva Signs, CNM [facility]   Follow up Visit:No Follow-up on file.  Postpartum contraception: Progesterone only pills  Newborn Data: Live born female  Birth Weight: 7 lb 15.2 oz (3605 g) APGAR: 8, 9  Baby Feeding: Breast Disposition:home with mother   04/09/2016 Frederik PearJulie P Degele, MD  CNM attestation I have seen and examined this patient and agree with above documentation in the resident's note.   [place] Dortha SchwalbeVazquez-Mosqueda is a 35 y.o. [id number] s/p rLTCS.   Pain is well controlled.  Plan for birth control is oral progesterone-only contraceptive.  Method of Feeding: breast  PE:  BP 122/72 (BP Location: Left Arm)   Pulse 74   Temp 97.7 F (36.5 C) (Oral)   Resp 18   Ht 4\' 10"  (1.473 m)   Wt 96.6 kg (213 lb)   LMP 07/08/2015 (Exact Date)   SpO2 98%   Breastfeeding? Unknown   BMI 44.52 kg/m  Fundus firm   Recent Labs  04/07/16 1327 04/08/16 0515  HGB 12.3 11.4*  HCT 34.6* 32.8*     Plan: discharge today - postpartum care discussed - f/u clinic in 6 weeks for postpartum visit   Cam HaiSHAW, KIMBERLY, CNM 9:34 AM  04/09/2016

## 2016-04-10 MED FILL — Bupivacaine HCl Preservative Free (PF) Inj 0.5%: INTRAMUSCULAR | Qty: 300 | Status: AC

## 2016-04-11 LAB — TYPE AND SCREEN
ABO/RH(D): B POS
ANTIBODY SCREEN: NEGATIVE
UNIT DIVISION: 0
Unit division: 0

## 2016-05-20 ENCOUNTER — Ambulatory Visit (INDEPENDENT_AMBULATORY_CARE_PROVIDER_SITE_OTHER): Payer: Medicaid Other | Admitting: Advanced Practice Midwife

## 2016-05-20 ENCOUNTER — Encounter: Payer: Self-pay | Admitting: Advanced Practice Midwife

## 2016-05-20 VITALS — BP 115/78 | HR 74 | Wt 181.0 lb

## 2016-05-20 DIAGNOSIS — Z30011 Encounter for initial prescription of contraceptive pills: Secondary | ICD-10-CM

## 2016-05-20 MED ORDER — NORETHINDRONE 0.35 MG PO TABS: 1.0000 | ORAL_TABLET | Freq: Every day | ORAL | 11 refills | Status: DC

## 2016-05-20 NOTE — Progress Notes (Signed)
Subjective:     Teresa Day is a 35 y.o. female who presents for a postpartum visit. She is 6 weeks postpartum following a repeat c/section. I have fully reviewed the prenatal and intrapartum course. The delivery was at 39 gestational weeks. Outcome: repeat cesarean section, low transverse incision. Anesthesia: epidural. Postpartum course has been uneventful. Baby's course has been uncomplicated. Baby is feeding by breast and bottle. Bleeding not at this time. Bowel  function is normal. Bladder function is normal . Patient  sexually active. Contraception method is oral progesterone-only contraceptive. Postpartum depression screening: negative     Review of Systems A comprehensive review of systems was negative.   Objective:    There were no vitals taken for this visit.  General:  alert, cooperative and no distress   Breasts:  inspection negative, no nipple discharge or bleeding, no masses or nodularity palpable  Lungs: not examined  Heart:  Not examined  Abdomen: soft, non-tender; bowel sounds normal; no masses,  no organomegaly   Vulva:  not evaluated  Vagina: not evaluated  Cervix:  nulliparous appearance  Corpus: not examined  Adnexa:  not evaluated  Rectal Exam: Not evaluated        Assessment:    Normal postpartum exam. Incision is well-healed with no redness or pus.   Plan:    1. Contraception: oral progesterone-only contraceptive 2. Emphasized importance of pregnancy spacing due to the fact she is a grand multiparous and has had 4 c-sections.  3. Follow up in 12 months  or as needed.

## 2016-05-20 NOTE — Patient Instructions (Signed)
Uso de los anticonceptivos orales (Oral Contraception Use) Los anticonceptivos orales (ACO) son medicamentos que se utilizan para evitar el embarazo. Su funcin es evitar que los ovarios liberen vulos. Las hormonas de los ACO tambin hacen que el moco cervical se haga ms espeso, lo que evita que el esperma ingrese al tero. Tambin hacen que la membrana que recubre internamente al tero se vuelva ms fina, lo que no permite que el huevo fertilizado se adhiera a la pared del tero. Los ACO son muy efectivos cuando se toman exactamente como se prescriben. Sin embargo, no previenen contra las enfermedades de transmisin sexual (ETS). La prctica del sexo seguro, como el uso de preservativos, junto con los ACO, ayudan a prevenir ese tipo de enfermedades. Antes de tomar ACO, debe hacerse un examen fsico y un Papanicolau. El mdico podr indicarle anlisis de sangre, si es necesario. El mdico se asegurar de que usted sea una buena candidata para usar anticonceptivos orales. Converse con su mdico acerca de los posibles efectos secundarios de los ACO que podran recetarle. Cuando se inicia el uso de ACO, se pueden tomar durante 2 a 3 meses para que el cuerpo se adapte a los cambios en los niveles hormonales en el cuerpo.  CMO TOMAR LOS ANTICONCEPTIVOS ORALES El mdico le indicar como comenzar a tomar el primer ciclo de ACO. De lo contrario usted puede:   Comenzar el da de inicio del ciclo menstrual. No necesitar proteccin anticonceptiva adicional al comenzar en este momento.   Comenzar el primer domingo luego de su perodo menstrual, o el da en que adquiere el medicamento. En estos casos deber tener proteccin anticonceptiva adicional durante los primeros 7 das del ciclo.   Comenzar a tomarlos en cualquier momento del ciclo. Si toma el anticonceptivo dentro de los 5 das de iniciado el perodo, estar protegida de quedar embarazada inmediatamente. En este caso, no necesitar una forma adicional de  anticonceptivos. Si comienza en cualquier otro momento del ciclo menstrual, necesitar usar otra forma de anticonceptivo durante 7 das. Si sus ACO son del tipo de los llamados minipldoras, podrn impedir el embarazo despus de tomarlas por 2 das (48 horas). Luego de comenzar a tomar los ACO:   Si olvid de tomar 1 pldora, tmela tan pronto como lo recuerde. Tome la siguiente pldora a la hora habitual.   Si dej de tomar 2 o ms pldoras, comunquese con su mdico ya que diferentes pldoras tienen diferentes instrucciones para las dosis que no se han tomado. Si olvida tomar 2 o ms pldoras, utilice un mtodo anticonceptivo adicional hasta que comience su prximo perodo menstrual.   Si utiliza el envase de 28 pldoras que contienen pldoras inactivas y olvida tomar 1 de las ltimas 7 (pldoras sin hormonas), sto no tiene importancia. Simplemente deseche el resto de las pldoras que no contienen hormonas y comience un nuevo envase.  No importa cuando comience a tomar los anticonceptivos, siempre empiece un nuevo envase el mismo da de la semana. Tenga un envase extra de ACO y use un mtodo anticonceptivo adicional para el caso en que se olvide de tomar algunas pldoras o pierda la caja.  INSTRUCCIONES PARA EL CUIDADO EN EL HOGAR   No fume.   Use siempre un condn para protegerse contra las enfermedades de transmisin sexual. Los ACO no protegen contra las enfermedades de transmisin sexual.   Use un almanaque para marcar los das de su perodo menstrual.   Lea la informacin y consejos que vienen con las ACO. Hable con   el profesional si tiene dudas.  SOLICITE ATENCIN MDICA SI:   Presenta nuseas o vmitos.   Tiene flujo o sangrado vaginal anormal.   Aparece una erupcin cutnea.   No tiene el perodo menstrual.   Pierde el cabello.   Necesita tratamiento por cambios en su estado de nimo o por depresin.   Se siente mareada al tomar los ACO.   Comienza a  aparecer acn con el uso de los ACO.   Queda embarazada.  SOLICITE ATENCIN MDICA DE INMEDIATO SI:   Siente dolor en el pecho.   Le falta el aire.   Le duele mucho la cabeza y no puede controlar el dolor.   Siente adormecimiento o tiene dificultad para hablar.   Tiene problemas de visin.   Presenta dolor, inflamacin o hinchazn en las piernas.    Esta informacin no tiene como fin reemplazar el consejo del mdico. Asegrese de hacerle al mdico cualquier pregunta que tenga.   Document Released: 07/09/2011 Document Revised: 03/22/2013 Elsevier Interactive Patient Education 2016 [employer]  

## 2018-12-08 ENCOUNTER — Ambulatory Visit (HOSPITAL_COMMUNITY)
Admission: EM | Admit: 2018-12-08 | Discharge: 2018-12-08 | Disposition: A | Discharge status: Home / Self Care | Payer: Self-pay | Attending: Internal Medicine | Admitting: Internal Medicine

## 2018-12-08 ENCOUNTER — Encounter (HOSPITAL_COMMUNITY): Payer: Self-pay

## 2018-12-08 ENCOUNTER — Other Ambulatory Visit: Payer: Self-pay

## 2018-12-08 DIAGNOSIS — G43009 Migraine without aura, not intractable, without status migrainosus: Secondary | ICD-10-CM

## 2018-12-08 MED ORDER — SUMATRIPTAN SUCCINATE 50 MG PO TABS: 50.0000 mg | ORAL_TABLET | Freq: Once | ORAL | 0 refills | Status: DC

## 2018-12-08 MED ORDER — DEXAMETHASONE SODIUM PHOSPHATE 10 MG/ML IJ SOLN
INTRAMUSCULAR | Status: AC
  Filled 2018-12-08: qty 1

## 2018-12-08 MED ORDER — DEXAMETHASONE SODIUM PHOSPHATE 10 MG/ML IJ SOLN
10.0000 mg | Freq: Once | INTRAMUSCULAR | Status: AC
  Administered 2018-12-08: 09:00:00 10 mg via INTRAMUSCULAR

## 2018-12-08 MED ORDER — SUMATRIPTAN SUCCINATE 6 MG/0.5ML ~~LOC~~ SOLN
SUBCUTANEOUS | Status: AC
  Filled 2018-12-08: qty 0.5

## 2018-12-08 MED ORDER — SUMATRIPTAN SUCCINATE 6 MG/0.5ML ~~LOC~~ SOLN
6.0000 mg | Freq: Once | SUBCUTANEOUS | Status: AC
  Administered 2018-12-08: 09:00:00 6 mg via SUBCUTANEOUS

## 2018-12-08 NOTE — ED Provider Notes (Signed)
[facility]    CSN: [id number] Arrival date & time: 12/08/18  0801     History   Chief Complaint Chief Complaint  Patient presents with  . Headache    HPI Teresa Day is a 38 y.o. female with no past medical history comes to urgent care with a 2-day history of unilateral headache.  Symptoms started 2 weeks ago and is gotten progressively worse.  Headache is unilateral of moderate severity.   Patient denies any aggravating factors.  Headache seems to improve when she gets some rest.  She has tearing of the right eye.  Patient denies any floaters in her visual field.  No runny nose.  No numbness or tingling on her face.  Patient denies any history of migraines.  There is a first episode of such headache she has had.  Her last menstrual period ended yesterday 5/6.  HPI  Past Medical History:  Diagnosis Date  . Preterm labor     Patient Active Problem List   Diagnosis Date Noted  . Postpartum care following cesarean delivery 05/20/2016  . Status post C-section 09/23/2015    Past Surgical History:  Procedure Laterality Date  . CERVICAL CERCLAGE  04/01/2012   Procedure: CERCLAGE CERVICAL;  Surgeon: Reva Boresanya S Pratt, MD;  Location: [facility];  Service: Gynecology;  Laterality: N/A;  . CERVICAL CERCLAGE N/A 09/22/2012   Procedure: CERCLAGE CERVICAL REMOVAL;  Surgeon: Adam PhenixJames G Arnold, MD;  Location: [facility];  Service: Obstetrics;  Laterality: N/A;  . CERVICAL CERCLAGE N/A 10/08/2015   Procedure: CERCLAGE CERVICAL;  Surgeon: Reva Boresanya S Pratt, MD;  Location: [facility];  Service: Gynecology;  Laterality: N/A;  . CERVICAL CERCLAGE N/A 04/07/2016   Procedure: REMOVAL OF CERVICAL CERCLAGE;  Surgeon: Levie HeritageJacob J Stinson, DO;  [facility];  Service: Obstetrics;  Laterality: N/A;  . CESAREAN SECTION    . CESAREAN SECTION N/A 09/22/2012   Procedure: CESAREAN SECTION;  Surgeon: Adam PhenixJames G Arnold, MD;  Location: [facility];  Service: Obstetrics;  Laterality: N/A;  Repeat  . CESAREAN  SECTION N/A 04/07/2016   Procedure: CESAREAN SECTION;  Surgeon: Levie HeritageJacob J Stinson, DO;  [facility];  Service: Obstetrics;  Laterality: N/A;    OB History    Gravida  6   Para  6   Term  4   Preterm  2   AB  0   Living  4     SAB      TAB  0   Ectopic  0   Multiple  0   Live Births  5            Home Medications    Prior to Admission medications   Medication Sig Start Date End Date Taking? Authorizing Provider  ibuprofen (ADVIL,MOTRIN) 600 MG tablet Take 1 tablet (600 mg total) by mouth every 6 (six) hours. 04/09/16   Degele, Kandra NicolasJulie P, MD  oxyCODONE-acetaminophen (PERCOCET/ROXICET) 5-325 MG tablet Take 1 tablet by mouth every 6 (six) hours as needed for severe pain. 04/09/16   Degele, Kandra NicolasJulie P, MD  SUMAtriptan (IMITREX) 50 MG tablet Take 1 tablet (50 mg total) by mouth once for 1 dose. May repeat in 2 hours if headache persists or recurs. 12/08/18 12/08/18  Merrilee JanskyLamptey, Princetta Uplinger O, MD    Family History Family History  Problem Relation Age of Onset  . Hypertension Mother   . Hypertension Father   . Other Neg Hx     Social History Social History   Tobacco Use  .  Smoking status: Never Smoker  . Smokeless tobacco: Never Used  Substance Use Topics  . Alcohol use: No  . Drug use: No     Allergies   Patient has no known allergies.   Review of Systems Review of Systems  Constitutional: Negative for activity change, appetite change, fatigue and fever.  HENT: Positive for ear discharge. Negative for congestion, dental problem, mouth sores, postnasal drip, sinus pressure, sinus pain, sore throat and trouble swallowing.   Eyes: Positive for pain, discharge, redness and visual disturbance. Negative for photophobia and itching.  Respiratory: Negative for apnea, cough and shortness of breath.   Gastrointestinal: Negative for abdominal distention, abdominal pain and nausea.  Genitourinary: Negative.   Musculoskeletal: Negative.   Skin: Negative.    Neurological: Negative.   Hematological: Negative.   Psychiatric/Behavioral: Negative for confusion and decreased concentration.  All other systems reviewed and are negative.    Physical Exam Triage Vital Signs ED Triage Vitals  Enc Vitals Group     BP 12/08/18 0821 (!) 127/93     Pulse Rate 12/08/18 0821 72     Resp 12/08/18 0821 18     Temp 12/08/18 0821 98.3 F (36.8 C)     Temp src --      SpO2 12/08/18 0821 99 %     Weight --      Height --      Head Circumference --      Peak Flow --      Pain Score 12/08/18 0859 6     Pain Loc --      Pain Edu? --      Excl. in GC? --    No data found.  Updated Vital Signs BP (!) 127/93   Pulse 72   Temp 98.3 F (36.8 C)   Resp 18   LMP 12/07/2018   SpO2 99%   Visual Acuity Right Eye Distance:   Left Eye Distance:   Bilateral Distance:    Right Eye Near:   Left Eye Near:    Bilateral Near:     Physical Exam Constitutional:      Appearance: She is well-developed.  Eyes:     Extraocular Movements: Extraocular movements intact.     Right eye: Normal extraocular motion and no nystagmus.     Pupils: Pupils are equal, round, and reactive to light.     Right eye: Pupil is reactive.  Neck:     Musculoskeletal: Normal range of motion.  Cardiovascular:     Rate and Rhythm: Normal rate and regular rhythm.     Heart sounds: No murmur. No gallop.   Pulmonary:     Effort: Pulmonary effort is normal.     Breath sounds: Normal breath sounds.  Skin:    General: Skin is warm and dry.     Capillary Refill: Capillary refill takes less than 2 seconds.  Neurological:     Mental Status: She is alert and oriented to person, place, and time.     GCS: GCS eye subscore is 4. GCS verbal subscore is 5. GCS motor subscore is 6.      UC Treatments / Results  Labs (all labs ordered are listed, but only abnormal results are displayed) Labs Reviewed - No data to display  EKG None  Radiology No results found.  Procedures  Procedures (including critical care time)  Medications Ordered in UC Medications  dexamethasone (DECADRON) injection 10 mg (10 mg Intramuscular Given 12/08/18 0857)  SUMAtriptan (IMITREX) injection 6  mg (6 mg Subcutaneous Given 12/08/18 0857)    Initial Impression / Assessment and Plan / UC Course  I have reviewed the triage vital signs and the nursing notes.  Pertinent labs & imaging results that were available during my care of the patient were reviewed by me and considered in my medical decision making (see chart for details).     1.  Acute migraine without aura: Abortive therapy given-dexamethasone 10 mg IM, Imitrex 6 mg subcu Patient is prescribed oral Imitrex to be used as needed Patient advised to return to urgent care if headache does not improve. If headache does not improve patient may benefit from CT scan of the brain. Final Clinical Impressions(s) / UC Diagnoses   Final diagnoses:  Migraine without aura and without status migrainosus, not intractable   Discharge Instructions   None    ED Prescriptions    Medication Sig Dispense Auth. Provider   SUMAtriptan (IMITREX) 50 MG tablet Take 1 tablet (50 mg total) by mouth once for 1 dose. May repeat in 2 hours if headache persists or recurs. 20 tablet Marlane Hirschmann, Britta Mccreedy, MD     Controlled Substance Prescriptions [place] Controlled Substance Registry consulted? No   Merrilee Jansky, MD 12/08/18 [phone]

## 2018-12-08 NOTE — ED Triage Notes (Signed)
Advised by interpreter that pat has had headache for 2 weeks

## 2018-12-13 ENCOUNTER — Emergency Department (HOSPITAL_COMMUNITY): Payer: Self-pay

## 2018-12-13 ENCOUNTER — Ambulatory Visit (HOSPITAL_COMMUNITY)
Admission: EM | Admit: 2018-12-13 | Discharge: 2018-12-13 | Disposition: A | Discharge status: Home / Self Care | Payer: Self-pay

## 2018-12-13 ENCOUNTER — Other Ambulatory Visit: Payer: Self-pay

## 2018-12-13 ENCOUNTER — Ambulatory Visit: Payer: Self-pay | Admitting: Emergency Medicine

## 2018-12-13 ENCOUNTER — Encounter: Payer: Self-pay | Admitting: Emergency Medicine

## 2018-12-13 ENCOUNTER — Emergency Department (HOSPITAL_COMMUNITY)
Admission: EM | Admit: 2018-12-13 | Discharge: 2018-12-13 | Disposition: A | Discharge status: Home / Self Care | Payer: Self-pay | Attending: Emergency Medicine | Admitting: Emergency Medicine

## 2018-12-13 VITALS — BP 112/77 | HR 88 | Temp 98.8°F | Resp 16 | Ht 59.0 in | Wt 156.2 lb

## 2018-12-13 DIAGNOSIS — R519 Headache, unspecified: Secondary | ICD-10-CM

## 2018-12-13 DIAGNOSIS — R51 Headache: Secondary | ICD-10-CM | POA: Insufficient documentation

## 2018-12-13 LAB — I-STAT BETA HCG BLOOD, ED (MC, WL, AP ONLY): I-stat hCG, quantitative: <5 m[IU]/mL (ref <5)

## 2018-12-13 MED ORDER — METOCLOPRAMIDE HCL 5 MG/ML IJ SOLN
10.0000 mg | Freq: Once | INTRAMUSCULAR | Status: AC
  Administered 2018-12-13: 18:00:00 10 mg via INTRAVENOUS
  Filled 2018-12-13: qty 2

## 2018-12-13 MED ORDER — SODIUM CHLORIDE 0.9 % IV BOLUS
1000.0000 mL | Freq: Once | INTRAVENOUS | Status: AC
  Administered 2018-12-13: 1000 mL via INTRAVENOUS

## 2018-12-13 MED ORDER — DIPHENHYDRAMINE HCL 50 MG/ML IJ SOLN
12.5000 mg | Freq: Once | INTRAMUSCULAR | Status: AC
  Administered 2018-12-13: 12.5 mg via INTRAVENOUS
  Filled 2018-12-13: qty 1

## 2018-12-13 NOTE — Progress Notes (Signed)
[facility] 38 [facility]   Chief Complaint  Patient presents with  . Headache    on the right side of head to the neck x 4 week, [facility]08/2018 for migraine    HISTORY OF PRESENT ILLNESS: This is a 38 y.o. female complaining of daily headache for the past 4 weeks.  No history of migraine headaches.  States it started while on the treadmill, right-sided to front of the head, waxes and wanes, now a 5 in a 1-10 scale, with photo and phonophobia, and occasional nausea.  Denies syncope.  Went to the urgent care center on 12/08/2018 and prescribed Imitrex for suspected migraine, however no relief from this medication.  Persistent symptoms but no new symptomatology.  Urgent care assessment as follows: 1.  Acute migraine without aura: Abortive therapy given-dexamethasone 10 mg IM, Imitrex 6 mg subcu Patient is prescribed oral Imitrex to be used as needed Patient advised to return to urgent care if headache does not improve. If headache does not improve patient may benefit from CT scan of the brain.  HPI   Prior to Admission medications   Medication Sig Start Date End Date Taking? Authorizing Provider  ibuprofen (ADVIL,MOTRIN) 600 MG tablet Take 1 tablet (600 mg total) by mouth every 6 (six) hours. 04/09/16  Yes Degele, Kandra NicolasJulie P, MD  oxyCODONE-acetaminophen (PERCOCET/ROXICET) 5-325 MG tablet Take 1 tablet by mouth every 6 (six) hours as needed for severe pain. Patient not taking: Reported on 12/13/2018 04/09/16   Degele, Kandra NicolasJulie P, MD  SUMAtriptan (IMITREX) 50 MG tablet Take 1 tablet (50 mg total) by mouth once for 1 dose. May repeat in 2 hours if headache persists or recurs. 12/08/18 12/08/18  Merrilee JanskyLamptey, Philip O, MD    No Known Allergies  Patient Active Problem List   Diagnosis Date Noted  . Postpartum care following cesarean delivery 05/20/2016  . Status post C-section 09/23/2015    Past Medical History:  Diagnosis Date  . Preterm labor     Past Surgical History:   Procedure Laterality Date  . CERVICAL CERCLAGE  04/01/2012   Procedure: CERCLAGE CERVICAL;  Surgeon: Reva Boresanya S Pratt, MD;  Location: [facility];  Service: Gynecology;  Laterality: N/A;  . CERVICAL CERCLAGE N/A 09/22/2012   Procedure: CERCLAGE CERVICAL REMOVAL;  Surgeon: Adam PhenixJames G Arnold, MD;  Location: [facility];  Service: Obstetrics;  Laterality: N/A;  . CERVICAL CERCLAGE N/A 10/08/2015   Procedure: CERCLAGE CERVICAL;  Surgeon: Reva Boresanya S Pratt, MD;  Location: [facility];  Service: Gynecology;  Laterality: N/A;  . CERVICAL CERCLAGE N/A 04/07/2016   Procedure: REMOVAL OF CERVICAL CERCLAGE;  Surgeon: Levie HeritageJacob J Stinson, DO;  [facility];  Service: Obstetrics;  Laterality: N/A;  . CESAREAN SECTION    . CESAREAN SECTION N/A 09/22/2012   Procedure: CESAREAN SECTION;  Surgeon: Adam PhenixJames G Arnold, MD;  Location: [facility];  Service: Obstetrics;  Laterality: N/A;  Repeat  . CESAREAN SECTION N/A 04/07/2016   Procedure: CESAREAN SECTION;  Surgeon: Levie HeritageJacob J Stinson, DO;  [facility];  Service: Obstetrics;  Laterality: N/A;    Social History   Socioeconomic History  . Marital status: Married    Spouse name: Not on file  . Number of children: Not on file  . Years of education: Not on file  . Highest education level: Not on file  Occupational History  . Not on file  Social Needs  . Financial resource strain: Not on file  . Food insecurity:  Worry: Not on file    Inability: Not on file  . Transportation needs:    Medical: Not on file    Non-medical: Not on file  Tobacco Use  . Smoking status: Never Smoker  . Smokeless tobacco: Never Used  Substance and Sexual Activity  . Alcohol use: No  . Drug use: No  . Sexual activity: Never    Birth control/protection: None  Lifestyle  . Physical activity:    Days per week: Not on file    Minutes per session: Not on file  . Stress: Not on file  Relationships  . Social connections:    Talks on phone: Not on file    Gets together: Not on file     Attends religious service: Not on file    Active member of club or organization: Not on file    Attends meetings of clubs or organizations: Not on file    Relationship status: Not on file  . Intimate partner violence:    Fear of current or ex partner: Not on file    Emotionally abused: Not on file    Physically abused: Not on file    Forced sexual activity: Not on file  Other Topics Concern  . Not on file  Social History Narrative  . Not on file    Family History  Problem Relation Age of Onset  . Hypertension Mother   . Hypertension Father   . Other Neg Hx      Review of Systems  Constitutional: Negative.  Negative for chills and fever.  HENT: Negative.  Negative for congestion, nosebleeds and sore throat.   Eyes: Positive for photophobia and pain (right). Negative for blurred vision and double vision.  Respiratory: Negative.  Negative for cough and shortness of breath.   Cardiovascular: Negative.  Negative for chest pain and palpitations.  Gastrointestinal: Positive for nausea. Negative for abdominal pain, diarrhea and vomiting.  Genitourinary: Negative.  Negative for dysuria.  Musculoskeletal: Negative.  Negative for myalgias.  Skin: Negative.  Negative for rash.  Neurological: Positive for headaches. Negative for dizziness.  All other systems reviewed and are negative.    Physical Exam Vitals signs reviewed.  Constitutional:      Appearance: She is well-developed.  HENT:     Head: Normocephalic and atraumatic.     Nose: Nose normal.     Mouth/Throat:     Mouth: Mucous membranes are moist.     Pharynx: Oropharynx is clear.  Eyes:     Extraocular Movements: Extraocular movements intact.     Conjunctiva/sclera: Conjunctivae normal.     Pupils: Pupils are equal, round, and reactive to light.     Funduscopic exam:    Right eye: No hemorrhage or papilledema. Red reflex present.        Left eye: No hemorrhage or papilledema. Red reflex present. Neck:      Musculoskeletal: Normal range of motion and neck supple. No neck rigidity.  Cardiovascular:     Rate and Rhythm: Normal rate and regular rhythm.     Heart sounds: Normal heart sounds.  Pulmonary:     Effort: Pulmonary effort is normal.     Breath sounds: Normal breath sounds.  Musculoskeletal: Normal range of motion.  Lymphadenopathy:     Cervical: No cervical adenopathy.  Skin:    General: Skin is warm and dry.  Neurological:     General: No focal deficit present.     Mental Status: She is alert and oriented to person,  place, and time.  Psychiatric:        Mood and Affect: Mood normal.        Behavior: Behavior normal.      ASSESSMENT & PLAN: Patient has no significant past medical history.  Healthy female.  No history of migraines.  Headache unresponsive to Imitrex prescribed at urgent care center.  Headache started while exercising on the treadmill 4 weeks ago and has not gotten better.  Has some associated symptoms including photophobia and nausea.  This intractable headache needs further evaluation and diagnostic imaging.  Advised to return to urgent care center for further evaluation and treatment.  Differential diagnosis discussed with the patient in detail.  [place] was seen today for headache.  Diagnoses and all orders for this visit:  Intractable headache, unspecified chronicity pattern, unspecified headache type    Patient Instructions     Return to urgent care center today for further evaluation, diagnosis, and treatment.  If you have lab work done today you will be contacted with your lab results within the next 2 weeks.  If you have not heard from [place] then please contact us. The fastest way to get your results is to register for My Chart.   IF you received an x-ray today, you will receive an invoice from [facility]. Please contact [facility] at [phone] with questions or concerns regarding your invoice.   IF you received labwork today,  you will receive an invoice from [facility]. Please contact [facility] at [phone] with questions or concerns regarding your invoice.   Our billing staff will not be able to assist you with questions regarding bills from these companies.  You will be contacted with the lab results as soon as they are available. The fastest way to get your results is to activate your My Chart account. Instructions are located on the last page of this paperwork. If you have not heard from [place] regarding the results in 2 weeks, please contact this office.     General Headache Without Cause A headache is pain or discomfort that is felt around the head or neck area. There are many causes and types of headaches. In some cases, the cause may not be found. Follow these instructions at home: Watch your condition for any changes. Let your doctor know about them. Take these steps to help with your condition: Managing pain      Take over-the-counter and prescription medicines only as told by your doctor.  Lie down in a dark, quiet room when you have a headache.  If told, put ice on your head and neck area: ? Put ice in a plastic bag. ? Place a towel between your skin and the bag. ? Leave the ice on for 20 minutes, 2-3 times per day.  If told, put heat on the affected area. Use the heat source that your doctor recommends, such as a moist heat pack or a heating pad. ? Place a towel between your skin and the heat source. ? Leave the heat on for 20-30 minutes. ? Remove the heat if your skin turns bright red. This is very important if you are unable to feel pain, heat, or cold. You may have a greater risk of getting burned.  Keep lights dim if bright lights bother you or make your headaches worse. Eating and drinking  Eat meals on a regular schedule.  If you drink alcohol: ? Limit how much you use to:  0-1 drink a day for women.  0-2 drinks  a day for men. ? Be aware of how much alcohol is in your drink. In the  [place] one drink equals one 12 oz bottle of beer (355 mL), one 5 oz glass of wine (148 mL), or one 1 oz glass of hard liquor (44 mL).  Stop drinking caffeine, or reduce how much caffeine you drink. General instructions   Keep a journal to find out if certain things bring on headaches. For example, write down: ? What you eat and drink. ? How much sleep you get. ? Any change to your diet or medicines.  Get a massage or try other ways to relax.  Limit stress.  Sit up straight. Do not tighten (tense) your muscles.  Do not use any products that contain nicotine or tobacco. This includes cigarettes, e-cigarettes, and chewing tobacco. If you need help quitting, ask your doctor.  Exercise regularly as told by your doctor.  Get enough sleep. This often means 7-9 hours of sleep each night.  Keep all follow-up visits as told by your doctor. This is important. Contact a doctor if:  Your symptoms are not helped by medicine.  You have a headache that feels different than the other headaches.  You feel sick to your stomach (nauseous) or you throw up (vomit).  You have a fever. Get help right away if:  Your headache gets very bad quickly.  Your headache gets worse after a lot of physical activity.  You keep throwing up.  You have a stiff neck.  You have trouble seeing.  You have trouble speaking.  You have pain in the eye or ear.  Your muscles are weak or you lose muscle control.  You lose your balance or have trouble walking.  You feel like you will pass out (faint) or you pass out.  You are mixed up (confused).  You have a seizure. Summary  A headache is pain or discomfort that is felt around the head or neck area.  There are many causes and types of headaches. In some cases, the cause may not be found.  Keep a journal to help find out what causes your headaches. Watch your condition for any changes. Let your doctor know about them.  Contact a doctor if you have a  headache that is different from usual, or if your headache is not helped by medicine.  Get help right away if your headache gets very bad, you throw up, you have trouble seeing, you lose your balance, or you have a seizure. This information is not intended to replace advice given to you by your health care provider. Make sure you discuss any questions you have with your health care provider. Document Released: 04/28/2008 Document Revised: 02/07/2018 Document Reviewed: 02/07/2018 Elsevier Interactive Patient Education  2019 Elsevier Inc.      Edwina Barth, MD [facility]

## 2018-12-13 NOTE — ED Notes (Signed)
Patient verbalizes understanding of discharge instructions. Opportunity for questioning and answers were provided. Armband removed by staff, pt discharged from ED. Pt offered wheel chair, pt preferred to walk to lobby. Pt confirmed she has ride home. Follow up care reviewed.

## 2018-12-13 NOTE — Patient Instructions (Addendum)
Return to urgent care center today for further evaluation, diagnosis, and treatment.  If you have lab work done today you will be contacted with your lab results within the next 2 weeks.  If you have not heard from us then please contact us. The fastest way to get your results is to register for My Chart.   IF you received an x-ray today, you will receive an invoice from [facility]. Please contact [facility] at [phone] with questions or concerns regarding your invoice.   IF you received labwork today, you will receive an invoice from [employer]. Please contact [facility] at [phone] with questions or concerns regarding your invoice.   Our billing staff will not be able to assist you with questions regarding bills from these companies.  You will be contacted with the lab results as soon as they are available. The fastest way to get your results is to activate your My Chart account. Instructions are located on the last page of this paperwork. If you have not heard from us regarding the results in 2 weeks, please contact this office.     General Headache Without Cause A headache is pain or discomfort that is felt around the head or neck area. There are many causes and types of headaches. In some cases, the cause may not be found. Follow these instructions at home: Watch your condition for any changes. Let your doctor know about them. Take these steps to help with your condition: Managing pain      Take over-the-counter and prescription medicines only as told by your doctor.  Lie down in a dark, quiet room when you have a headache.  If told, put ice on your head and neck area: ? Put ice in a plastic bag. ? Place a towel between your skin and the bag. ? Leave the ice on for 20 minutes, 2-3 times per day.  If told, put heat on the affected area. Use the heat source that your doctor recommends, such as a moist heat pack or a heating pad. ? Place a towel between  your skin and the heat source. ? Leave the heat on for 20-30 minutes. ? Remove the heat if your skin turns bright red. This is very important if you are unable to feel pain, heat, or cold. You may have a greater risk of getting burned.  Keep lights dim if bright lights bother you or make your headaches worse. Eating and drinking  Eat meals on a regular schedule.  If you drink alcohol: ? Limit how much you use to:  0-1 drink a day for women.  0-2 drinks a day for men. ? Be aware of how much alcohol is in your drink. In the [place] one drink equals one 12 oz bottle of beer (355 mL), one 5 oz glass of wine (148 mL), or one 1 oz glass of hard liquor (44 mL).  Stop drinking caffeine, or reduce how much caffeine you drink. General instructions   Keep a journal to find out if certain things bring on headaches. For example, write down: ? What you eat and drink. ? How much sleep you get. ? Any change to your diet or medicines.  Get a massage or try other ways to relax.  Limit stress.  Sit up straight. Do not tighten (tense) your muscles.  Do not use any products that contain nicotine or tobacco. This includes cigarettes, e-cigarettes, and chewing tobacco. If you need help quitting, ask your doctor.  Exercise  regularly as told by your doctor.  Get enough sleep. This often means 7-9 hours of sleep each night.  Keep all follow-up visits as told by your doctor. This is important. Contact a doctor if:  Your symptoms are not helped by medicine.  You have a headache that feels different than the other headaches.  You feel sick to your stomach (nauseous) or you throw up (vomit).  You have a fever. Get help right away if:  Your headache gets very bad quickly.  Your headache gets worse after a lot of physical activity.  You keep throwing up.  You have a stiff neck.  You have trouble seeing.  You have trouble speaking.  You have pain in the eye or ear.  Your muscles are  weak or you lose muscle control.  You lose your balance or have trouble walking.  You feel like you will pass out (faint) or you pass out.  You are mixed up (confused).  You have a seizure. Summary  A headache is pain or discomfort that is felt around the head or neck area.  There are many causes and types of headaches. In some cases, the cause may not be found.  Keep a journal to help find out what causes your headaches. Watch your condition for any changes. Let your doctor know about them.  Contact a doctor if you have a headache that is different from usual, or if your headache is not helped by medicine.  Get help right away if your headache gets very bad, you throw up, you have trouble seeing, you lose your balance, or you have a seizure. This information is not intended to replace advice given to you by your health care provider. Make sure you discuss any questions you have with your health care provider. Document Released: 04/28/2008 Document Revised: 02/07/2018 Document Reviewed: 02/07/2018 Elsevier Interactive Patient Education  2019 [employer].

## 2018-12-13 NOTE — Discharge Instructions (Addendum)
You have been diagnosed today with Headache.  At this time there does not appear to be the presence of an emergent medical condition, however there is always the potential for conditions to change. Please read and follow the below instructions.  Please return to the Emergency Department immediately for any new or worsening symptoms. Please be sure to follow up with your Primary Care Provider within one week regarding your visit today; please call their office to schedule an appointment even if you are feeling better for a follow-up visit. Please call the neurologist at [facility] neurology tomorrow to schedule a follow-up appointment for next week regarding your migraines as prophylactic treatment may be helpful.  Get help right away if: Your headache gets very bad. You have a fever. You have a stiff neck. You have trouble seeing. Your muscles feel weak or like you cannot control them. You start to lose your balance a lot. You start to have trouble walking. You pass out (faint).  Please read the additional information packets attached to your discharge summary. ============= The following has been translated using Google translate.  Errors may be present.  Interpret with caution.  Lo siguiente ha sido traducido [employer]. Los errores [employer]. Interpretar con precaucin. ============= [employer] han diagnosticado migraa sin aura, no intratable.  En este momento no parece existir la presencia de una condicin mdica emergente, sin embargo, siempre existe la posibilidad de que las condiciones [place]. Lea y siga las instrucciones a continuacin.  1. Regrese al Departamento de emergencias de inmediato por cualquier sntoma nuevo o que empeore. 2. Asegrese de hacer un seguimiento con su proveedor de atencin primaria dentro de una semana con respecto a su visita de hoy; llame a su oficina para programar una cita, incluso si se siente mejor para una visita de  seguimiento. 3. Llame al neurlogo de la neurologa de [place] maana para programar una cita de seguimiento para la prxima semana con respecto a sus migraas, ya que el tratamiento profilctico puede ser til.  Obtenga ayuda de inmediato si: ? Tu dolor de cabeza se pone muy mal. ? Tienes fiebre. ? Tienes el cuello rgido. ? Tienes problemas para ver. ? Tus msculos se sienten dbiles o como si no pudieras controlarlos. ? Empiezas a perder [employer] equilibrio. ? Empiezas a tener problemas para caminar. ? Te desmayas (dbil).  Lea los paquetes de informacin adicional adjuntos a su resumen de alta.

## 2018-12-13 NOTE — ED Provider Notes (Signed)
[facility] EMERGENCY DEPARTMENT Provider Note   CSN: [id number] Arrival date & time: 12/13/18  1702    History   Chief Complaint No chief complaint on file.   HPI   history obtained with use of interpreter [place] is a 38 y.o. female present day with headache x4 weeks.  Patient reports right-sided headache which has waxed and waned for the past 4 weeks she describes a throbbing sensation without aggravating or alleviating factors.  Patient seen at urgent care on 12/08/2018 at which time she was prescribed Imitrex for migraine without aura.  Patient reports that she has been using this medication without relief.  She reports today for continued headache.  Patient denies fever/chills, neck pain/stiffness, vision changes, nausea/vomiting, abdominal pain, numbness/weakness or tingling.  She denies injury or trauma or any additional concerns today.  She reports that she is an otherwise healthy 38 year old female without chronic medical conditions or daily medication use.  Patient reports that she was seen at urgent care today who sent her to ER for CT head.     HPI  Past Medical History:  Diagnosis Date  . Preterm labor     Patient Active Problem List   Diagnosis Date Noted  . Postpartum care following cesarean delivery 05/20/2016  . Status post C-section 09/23/2015    Past Surgical History:  Procedure Laterality Date  . CERVICAL CERCLAGE  04/01/2012   Procedure: CERCLAGE CERVICAL;  Surgeon: Reva Bores, MD;  Location: [facility];  Service: Gynecology;  Laterality: N/A;  . CERVICAL CERCLAGE N/A 09/22/2012   Procedure: CERCLAGE CERVICAL REMOVAL;  Surgeon: Adam Phenix, MD;  Location: [facility];  Service: Obstetrics;  Laterality: N/A;  . CERVICAL CERCLAGE N/A 10/08/2015   Procedure: CERCLAGE CERVICAL;  Surgeon: Reva Bores, MD;  Location: [facility];  Service: Gynecology;  Laterality: N/A;  . CERVICAL CERCLAGE N/A 04/07/2016   Procedure: REMOVAL OF CERVICAL  CERCLAGE;  Surgeon: Levie Heritage, DO;  Location: [facility] BIRTHING SUITES;  Service: Obstetrics;  Laterality: N/A;  . CESAREAN SECTION    . CESAREAN SECTION N/A 09/22/2012   Procedure: CESAREAN SECTION;  Surgeon: Adam Phenix, MD;  Location: [facility];  Service: Obstetrics;  Laterality: N/A;  Repeat  . CESAREAN SECTION N/A 04/07/2016   Procedure: CESAREAN SECTION;  Surgeon: Levie Heritage, DO;  Location: [facility] BIRTHING SUITES;  Service: Obstetrics;  Laterality: N/A;     OB History    Gravida  6   Para  6   Term  4   Preterm  2   AB  0   Living  4     SAB      TAB  0   Ectopic  0   Multiple  0   Live Births  5            Home Medications    Prior to Admission medications   Medication Sig Start Date End Date Taking? Authorizing Provider  ibuprofen (ADVIL,MOTRIN) 600 MG tablet Take 1 tablet (600 mg total) by mouth every 6 (six) hours. 04/09/16   Degele, Kandra Nicolas, MD  oxyCODONE-acetaminophen (PERCOCET/ROXICET) 5-325 MG tablet Take 1 tablet by mouth every 6 (six) hours as needed for severe pain. Patient not taking: Reported on 12/13/2018 04/09/16   Degele, Kandra Nicolas, MD  SUMAtriptan (IMITREX) 50 MG tablet Take 1 tablet (50 mg total) by mouth once for 1 dose. May repeat in 2 hours if headache persists or recurs. 12/08/18 12/08/18  Merrilee Jansky,  MD    Family History Family History  Problem Relation Age of Onset  . Hypertension Mother   . Hypertension Father   . Other Neg Hx     Social History Social History   Tobacco Use  . Smoking status: Never Smoker  . Smokeless tobacco: Never Used  Substance Use Topics  . Alcohol use: No  . Drug use: No     Allergies   Patient has no known allergies.   Review of Systems Review of Systems  Constitutional: Negative for chills and fever.  HENT: Negative.  Negative for congestion, facial swelling, rhinorrhea and sore throat.   Eyes: Negative.  Negative for photophobia, pain and visual disturbance.  Respiratory: Negative.   Negative for cough and shortness of breath.   Cardiovascular: Negative.  Negative for chest pain.  Gastrointestinal: Negative.  Negative for abdominal pain, nausea and vomiting.  Musculoskeletal: Negative.  Negative for neck pain and neck stiffness.  Neurological: Positive for headaches. Negative for dizziness, syncope, speech difficulty, weakness, light-headedness and numbness.  All other systems reviewed and are negative.  Physical Exam Updated Vital Signs BP (!) 136/97 (BP Location: Right Arm)   Pulse 84   Temp 98.8 F (37.1 C) (Oral)   Resp 16   Ht  (1.499 m)   Wt 70.8 kg   LMP 12/07/2018   SpO2 97%   BMI 31.51 kg/m   Physical Exam Constitutional:      General: She is not in acute distress.    Appearance: Normal appearance. She is well-developed. She is not ill-appearing or diaphoretic.  HENT:     Head: Normocephalic and atraumatic. No raccoon eyes, Battle's sign, abrasion or contusion.     Jaw: There is normal jaw occlusion. No trismus.     Right Ear: Tympanic membrane, ear canal and external ear normal. No hemotympanum.     Left Ear: Tympanic membrane, ear canal and external ear normal. No hemotympanum.     Ears:     Comments: Hearing grossly intact bilaterally    Nose: Nose normal. No rhinorrhea.     Right Nostril: No epistaxis.     Left Nostril: No epistaxis.     Mouth/Throat:     Lips: Pink.     Mouth: Mucous membranes are moist.     Pharynx: Oropharynx is clear. Uvula midline.  Eyes:     General: Lids are normal. Vision grossly intact. Gaze aligned appropriately.     Extraocular Movements: Extraocular movements intact.     Conjunctiva/sclera: Conjunctivae normal.     Pupils: Pupils are equal, round, and reactive to light.     Comments: Visual fields grossly intact bilaterally  Neck:     Musculoskeletal: Full passive range of motion without pain, normal range of motion and neck supple. No neck rigidity.     Trachea: Trachea and phonation normal. No  tracheal tenderness or tracheal deviation.     Meningeal: Brudzinski's sign and Kernig's sign absent.  Cardiovascular:     Rate and Rhythm: Normal rate and regular rhythm.     Pulses:          Dorsalis pedis pulses are 2+ on the right side and 2+ on the left side.       Posterior tibial pulses are 2+ on the right side and 2+ on the left side.     Heart sounds: Normal heart sounds.  Pulmonary:     Effort: Pulmonary effort is normal. No respiratory distress.     Breath sounds:  Normal breath sounds and air entry.  Abdominal:     General: Bowel sounds are normal. There is no distension.     Palpations: Abdomen is soft.     Tenderness: There is no abdominal tenderness. There is no guarding or rebound.  Musculoskeletal:     Comments: No midline C/T/L spinal tenderness to palpation, no paraspinal muscle tenderness, no deformity, crepitus, or step-off noted. No sign of injury to the neck or back.  Feet:     Right foot:     Protective Sensation: 3 sites tested. 3 sites sensed.     Left foot:     Protective Sensation: 3 sites tested. 3 sites sensed.  Skin:    General: Skin is warm and dry.  Neurological:     Mental Status: She is alert and oriented to person, place, and time.     GCS: GCS eye subscore is 4. GCS verbal subscore is 5. GCS motor subscore is 6.     Comments: Mental Status: Alert, oriented, thought content appropriate, able to give a coherent history. Speech fluent without evidence of aphasia. Able to follow 2 step commands without difficulty. Cranial Nerves: II: Peripheral visual fields grossly normal, pupils equal, round, reactive to light III,IV, VI: ptosis not present, extra-ocular motions intact bilaterally V,VII: smile symmetric, eyebrows raise symmetric, facial light touch sensation equal VIII: hearing grossly normal to voice X: uvula elevates symmetrically XI: bilateral shoulder shrug symmetric and strong XII: midline tongue extension without fassiculations  Motor: Normal tone. 5/5 strength in upper and lower extremities bilaterally including strong and equal grip strength and dorsiflexion/plantar flexion Sensory: Sensation intact to light touch in all extremities.Negative Romberg.  Deep Tendon Reflexes: 2+ and symmetric in patella Cerebellar: normal finger-to-nose maze with bilateral upper extremities. Normal heel-to -shin balance bilaterally of the lower extremity. No pronator drift.  Gait: normal gait and balance CV: distal pulses palpable throughout  Psychiatric:        Mood and Affect: Mood normal.        Behavior: Behavior is cooperative.    ED Treatments / Results  Labs (all labs ordered are listed, but only abnormal results are displayed) Labs Reviewed  I-STAT BETA HCG BLOOD, ED ([facility], [facility], [facility] ONLY)    EKG None  Radiology Ct Head Wo Contrast  Result Date: 12/13/2018 CLINICAL DATA:  Headache every day for 4 weeks. EXAM: CT HEAD WITHOUT CONTRAST TECHNIQUE: Contiguous axial images were obtained from the base of the skull through the vertex without intravenous contrast. COMPARISON:  None. FINDINGS: Brain: No evidence of acute infarction, hemorrhage, hydrocephalus, extra-axial collection or mass lesion/mass effect. Vascular: No hyperdense vessel or unexpected calcification. Skull: Intact.  No focal lesion. Sinuses/Orbits: Negative. Other: None. IMPRESSION: Normal head CT. Electronically Signed   By: Drusilla Kanner M.D.   On: 12/13/2018 18:51    Procedures Procedures (including critical care time)  Medications Ordered in ED Medications  sodium chloride 0.9 % bolus 1,000 mL (1,000 mLs Intravenous New Bag/Given 12/13/18 1750)  metoCLOPramide (REGLAN) injection 10 mg (10 mg Intravenous Given 12/13/18 1753)  diphenhydrAMINE (BENADRYL) injection 12.5 mg (12.5 mg Intravenous Given 12/13/18 1752)     Initial Impression / Assessment and Plan / ED Course  I have reviewed the triage vital signs and the nursing notes.  Pertinent labs &  imaging results that were available during my care of the patient were reviewed by me and considered in my medical decision making (see chart for details).    Washington Barlowe is a 38  y.o. female who presents to ED for right-sided headache x 4 weeks. Physical Examination reassuring and without focal neuro deficits. Patient denies sudden onset, syncope, neck stiffnes or significant trauma.  Any visual changes, photophobia or eye pain on my examination.  She is overall very well-appearing and in no acute distress.  No meningeal signs on examination.  No pain about the temporal arteries, facial arteries and no cervical pain.  Due to duration of headache and PCP request CT head performed.  CT Head:  IMPRESSION:  Normal head CT.   Beta-hCG negative.  Migraine cocktail including 10 mg Reglan, 12.5 mg Benadryl, 1 L fluid bolus given. - Patient reassessed states complete resolution of headache here in the ED. On re-evaluation, patient overall very well-appearing and in no acute distress.  On reexamination patient denies any neurologic symptoms such as visual changes, focal numbness/weakness, balance problems, confusion, or speech difficulty to suggest a life-threatening intracranial process such as intracranial hemorrhage or mass. The patient has no clotting risk factors thus venous sinus thrombosis is unlikely. No fevers, neck pain or nuchal rigidity to suggest meningitis. Patient is afebrile, non-toxic and well appearing. Reassuring neuro exam, normal gait around room and back. No cranial deficits, no speech deficits, negative pronator drift, normal/equal strength to all extremities.   Due to duration of headache will give neurology referral for her migraine. I have reviewed return precautions including development of fever, nausea/vomiting or neurologic symptoms, vision changes, confusion, lethargy, difficulty speaking/walking, or other new/worsening/concerning symptoms. Patient states  understanding of return precautions. Patient is agreeable to discharge.  At this time there does not appear to be any evidence of an acute emergency medical condition and the patient appears stable for discharge with appropriate outpatient follow up. Diagnosis was discussed with patient who verbalizes understanding of care plan and is agreeable to discharge. I have discussed return precautions with patient who verbalizes understanding of return precautions. Patient encouraged to follow-up with their PCP and neurology. All questions answered.  Patient has been discharged in good condition.  Case discussed with Dr. Rush Landmarkegeler who agrees with discharge and outpatient follow-up at this time.  Translation services were used throughout this visit including discussion of plan of care, results and return precautions.  Note: Portions of this report may have been transcribed using voice recognition software. Every effort was made to ensure accuracy; however, inadvertent computerized transcription errors may still be present. Final Clinical Impressions(s) / ED Diagnoses   Final diagnoses:  Migraine without aura and without status migrainosus, not intractable    ED Discharge Orders    None       Elizabeth PalauMorelli, Kamran Coker A, PA-C 12/13/18 2045    Tegeler, Canary Brimhristopher J, MD 12/14/18 [phone]

## 2018-12-13 NOTE — ED Triage Notes (Signed)
Pt was sent here by PCP for headache imaging, pt states shes had headaches for weeks now, pt was sent here for CT scan. Pt informed of our capabilites here, will walk patient to ER.

## 2018-12-13 NOTE — ED Notes (Signed)
Pt's sps. Althea Charon [phone]. Pls contact with status update

## 2018-12-13 NOTE — ED Triage Notes (Signed)
Presents from UC with 4 weeks hx of R side head and eye pain and pressure.  No injury to area, no visual changes, or N/V.

## 2019-03-08 ENCOUNTER — Ambulatory Visit: Payer: Self-pay | Admitting: Neurology

## 2019-03-13 ENCOUNTER — Ambulatory Visit: Payer: Self-pay | Admitting: Neurology

## 2019-03-13 ENCOUNTER — Encounter: Payer: Self-pay | Admitting: Neurology

## 2019-03-13 ENCOUNTER — Telehealth: Payer: Self-pay | Admitting: *Deleted

## 2019-03-13 NOTE — Telephone Encounter (Signed)
Pt no showed new pt appt today. 

## 2019-09-15 ENCOUNTER — Other Ambulatory Visit: Payer: Self-pay

## 2019-09-15 ENCOUNTER — Encounter (HOSPITAL_COMMUNITY): Payer: Self-pay

## 2019-09-15 ENCOUNTER — Ambulatory Visit (HOSPITAL_COMMUNITY)
Admission: EM | Admit: 2019-09-15 | Discharge: 2019-09-15 | Disposition: A | Discharge status: Home / Self Care | Payer: Self-pay | Attending: Physician Assistant | Admitting: Physician Assistant

## 2019-09-15 DIAGNOSIS — M546 Pain in thoracic spine: Secondary | ICD-10-CM | POA: Insufficient documentation

## 2019-09-15 DIAGNOSIS — M549 Dorsalgia, unspecified: Secondary | ICD-10-CM | POA: Insufficient documentation

## 2019-09-15 DIAGNOSIS — R0789 Other chest pain: Secondary | ICD-10-CM | POA: Insufficient documentation

## 2019-09-15 DIAGNOSIS — Z3202 Encounter for pregnancy test, result negative: Secondary | ICD-10-CM

## 2019-09-15 LAB — POCT URINALYSIS DIP (DEVICE)
Bilirubin Urine: NEGATIVE
Glucose, UA: NEGATIVE mg/dL
Ketones, ur: NEGATIVE mg/dL
Leukocytes,Ua: NEGATIVE
Nitrite: NEGATIVE
Protein, ur: NEGATIVE mg/dL
Specific Gravity, Urine: 1.02 (ref 1.005–1.030)
Urobilinogen, UA: 0.2 mg/dL (ref 0.0–1.0)
pH: 8.5 — ABNORMAL HIGH (ref 5.0–8.0)

## 2019-09-15 LAB — POC URINE PREG, ED: Preg Test, Ur: NEGATIVE

## 2019-09-15 LAB — POCT PREGNANCY, URINE: Preg Test, Ur: NEGATIVE

## 2019-09-15 MED ORDER — CYCLOBENZAPRINE HCL 10 MG PO TABS: 10.0000 mg | ORAL_TABLET | Freq: Two times a day (BID) | ORAL | 0 refills | Status: DC | PRN

## 2019-09-15 NOTE — Discharge Instructions (Addendum)
Creo que puede haber tenido COVID u otro virus en diciembre y que est en proceso de recuperacin. Esto puede llevar tiempo.  Creo que el dolor de espalda est relacionado con el reciente aumento de ejercicio.  Tomar ibuprofeno y la [employer] he recetado  Si los dolores empeoran mucho o su respiracin se vuelve difcil, regrese para ser evaluado.  Haga un seguimiento con el proveedor de Glass blower/designer en 2-3 semanas si lo considera necesario.

## 2019-09-15 NOTE — ED Triage Notes (Signed)
Pt presents with lower back pain and bilateral flank pain X 1 week.  Pt states she had covid about a month ago.

## 2019-09-15 NOTE — ED Provider Notes (Signed)
[facility]    CSN: [id number] Arrival date & time: 09/15/19  6213      History   Chief Complaint Chief Complaint  Patient presents with  . Back Pain  . Flank Pain    HPI Teresa Day is a 39 y.o. female.   Patient reports to urgent care today for mid and upper back pain and chest tightness with exercise at times. She reports believing she had COVID started Dec 25th 2020 and had COVID like symptoms of cough, shortness of breath, fever and loss of taste and smell until Aug 04 2019. All of these symptoms resolved. She did not get tested at the time but self isolated for 14 days until all symptoms resolved. She reports continued chest tightness with deep breathing and "phlegm" after exercising. She denies difficulty breathing or cough. She reports starting zumba classes about 10-12 days ago and noticing these symptoms. She also notes starting 7 days ago having upper and mid back pain after her exercise classes. This pain is sharp and achey at times. It is not present until after her classes. Movement does not make it worse.  She denies fever, chills, cough, headache, sore throat.   She has not taken medications for symptoms.      Past Medical History:  Diagnosis Date  . Preterm labor     Patient Active Problem List   Diagnosis Date Noted  . Postpartum care following cesarean delivery 05/20/2016  . Status post C-section 09/23/2015    Past Surgical History:  Procedure Laterality Date  . CERVICAL CERCLAGE  04/01/2012   Procedure: CERCLAGE CERVICAL;  Surgeon: Reva Bores, MD;  Location: [facility];  Service: Gynecology;  Laterality: N/A;  . CERVICAL CERCLAGE N/A 09/22/2012   Procedure: CERCLAGE CERVICAL REMOVAL;  Surgeon: Adam Phenix, MD;  Location: [facility];  Service: Obstetrics;  Laterality: N/A;  . CERVICAL CERCLAGE N/A 10/08/2015   Procedure: CERCLAGE CERVICAL;  Surgeon: Reva Bores, MD;  Location: [facility];  Service: Gynecology;  Laterality: N/A;  .  CERVICAL CERCLAGE N/A 04/07/2016   Procedure: REMOVAL OF CERVICAL CERCLAGE;  Surgeon: Levie Heritage, DO;  Location: [facility] BIRTHING SUITES;  Service: Obstetrics;  Laterality: N/A;  . CESAREAN SECTION    . CESAREAN SECTION N/A 09/22/2012   Procedure: CESAREAN SECTION;  Surgeon: Adam Phenix, MD;  Location: [facility];  Service: Obstetrics;  Laterality: N/A;  Repeat  . CESAREAN SECTION N/A 04/07/2016   Procedure: CESAREAN SECTION;  Surgeon: Levie Heritage, DO;  Location: [facility] BIRTHING SUITES;  Service: Obstetrics;  Laterality: N/A;    OB History    Gravida  6   Para  6   Term  4   Preterm  2   AB  0   Living  4     SAB      TAB  0   Ectopic  0   Multiple  0   Live Births  5            Home Medications    Prior to Admission medications   Medication Sig Start Date End Date Taking? Authorizing Provider  cyclobenzaprine (FLEXERIL) 10 MG tablet Take 1 tablet (10 mg total) by mouth 2 (two) times daily as needed for muscle spasms. 09/15/19   Roneka Gilpin, Veryl Speak, PA-C  ibuprofen (ADVIL,MOTRIN) 600 MG tablet Take 1 tablet (600 mg total) by mouth every 6 (six) hours. 04/09/16   Degele, Kandra Nicolas, MD  oxyCODONE-acetaminophen (PERCOCET/ROXICET) 5-325 MG tablet Take 1  tablet by mouth every 6 (six) hours as needed for severe pain. Patient not taking: Reported on 12/13/2018 04/09/16   Degele, Kandra Nicolas, MD  SUMAtriptan (IMITREX) 50 MG tablet Take 1 tablet (50 mg total) by mouth once for 1 dose. May repeat in 2 hours if headache persists or recurs. 12/08/18 12/08/18  Merrilee Jansky, MD    Family History Family History  Problem Relation Age of Onset  . Hypertension Mother   . Hypertension Father   . Other Neg Hx     Social History Social History   Tobacco Use  . Smoking status: Never Smoker  . Smokeless tobacco: Never Used  Substance Use Topics  . Alcohol use: No  . Drug use: No     Allergies   Patient has no known allergies.   Review of Systems Review of Systems  Constitutional:  Negative for chills and fever.  HENT: Negative for congestion, ear pain, sinus pressure, sinus pain and sore throat.   Eyes: Negative for pain and visual disturbance.  Respiratory: Positive for chest tightness. Negative for cough and shortness of breath.   Cardiovascular: Negative for chest pain and palpitations.  Gastrointestinal: Negative for abdominal pain, diarrhea, nausea and vomiting.  Genitourinary: Negative for dysuria and hematuria.  Musculoskeletal: Positive for back pain and neck pain. Negative for arthralgias and myalgias.  Skin: Negative for color change and rash.  Neurological: Negative for seizures, syncope and headaches.  All other systems reviewed and are negative.    Physical Exam Triage Vital Signs ED Triage Vitals  Enc Vitals Group     BP 09/15/19 0838 135/79     Pulse Rate 09/15/19 0838 67     Resp 09/15/19 0838 18     Temp 09/15/19 0838 98.6 F (37 C)     Temp Source 09/15/19 0838 Oral     SpO2 09/15/19 0838 98 %     Weight --      Height --      Head Circumference --      Peak Flow --      Pain Score 09/15/19 0842 8     Pain Loc --      Pain Edu? --      Excl. in GC? --    No data found.  Updated Vital Signs BP 135/79 (BP Location: Left Arm)   Pulse 67   Temp 98.6 F (37 C) (Oral)   Resp 18   LMP 09/04/2019   SpO2 98%   Visual Acuity Right Eye Distance:   Left Eye Distance:   Bilateral Distance:    Right Eye Near:   Left Eye Near:    Bilateral Near:     Physical Exam Vitals and nursing note reviewed.  Constitutional:      General: She is not in acute distress.    Appearance: Normal appearance. She is well-developed. She is not ill-appearing.  HENT:     Head: Normocephalic and atraumatic.  Eyes:     Conjunctiva/sclera: Conjunctivae normal.  Cardiovascular:     Rate and Rhythm: Normal rate and regular rhythm.     Heart sounds: No murmur. No friction rub. No gallop.   Pulmonary:     Effort: Pulmonary effort is normal. No  respiratory distress.     Breath sounds: Normal breath sounds. No wheezing, rhonchi or rales.  Abdominal:     Palpations: Abdomen is soft.     Tenderness: There is no abdominal tenderness.  Musculoskeletal:  General: Normal range of motion.     Cervical back: Normal range of motion and neck supple. Tenderness present.     Right lower leg: No edema.     Left lower leg: No edema.     Comments: TTP along trapezius muscles and musculature of mid and upper back. ROM intact without issue. 5/5 strength throughout. Good sensation.   Skin:    General: Skin is warm and dry.     Capillary Refill: Capillary refill takes less than 2 seconds.  Neurological:     General: No focal deficit present.     Mental Status: She is alert and oriented to person, place, and time.     Sensory: No sensory deficit.  Psychiatric:        Mood and Affect: Mood normal.        Behavior: Behavior normal.        Thought Content: Thought content normal.        Judgment: Judgment normal.      UC Treatments / Results  Labs (all labs ordered are listed, but only abnormal results are displayed) Labs Reviewed  POCT URINALYSIS DIP (DEVICE) - Abnormal; Notable for the following components:      Result Value   Hgb urine dipstick TRACE (*)    pH 8.5 (*)    All other components within normal limits  URINE CULTURE  POC URINE PREG, ED  POCT PREGNANCY, URINE    EKG   Radiology No results found.  Procedures Procedures (including critical care time)  Medications Ordered in UC Medications - No data to display  Initial Impression / Assessment and Plan / UC Course  I have reviewed the triage vital signs and the nursing notes.  Pertinent labs & imaging results that were available during my care of the patient were reviewed by me and considered in my medical decision making (see chart for details).    #Chest tightness No diagnostic testing performed at the time but seems she may have had covid around Dec  25-Jan1 time frame. Otherwise recovered with some residual chest tightness. Seems to be some deconditioning as she is only now getting back to exercise. Discussed if symptoms worsened or continue to persist to follow up with PCP  #Back pain Neurologically well. Believe this to be related to recent increase in exercise after prolonged period of not exercising. Flexeril for night time muscle relaxation and ibuprofen as needed. Follow up if not improving in a 1-2 weeks. Reduce intensity of exercise.   Final Clinical Impressions(s) / UC Diagnoses   Final diagnoses:  Chest tightness  Acute bilateral thoracic back pain  Upper back pain     Discharge Instructions     Creo que puede haber tenido COVID u otro virus en diciembre y que est en proceso de recuperacin. Esto puede llevar tiempo.  Creo que el dolor de espalda est relacionado con el reciente aumento de ejercicio.  Tomar ibuprofeno y la [employer] he recetado  Si los dolores empeoran mucho o su respiracin se vuelve difcil, regrese para ser evaluado.  Haga un seguimiento con el proveedor de Copy en 2-3 semanas si lo considera necesario.      ED Prescriptions    Medication Sig Dispense Auth. Provider   cyclobenzaprine (FLEXERIL) 10 MG tablet Take 1 tablet (10 mg total) by mouth 2 (two) times daily as needed for muscle spasms. 20 tablet Teresa Day, Marguerita Beards, PA-C     PDMP not reviewed this encounter.  Hermelinda Medicus, PA-C 09/15/19 1229

## 2019-09-16 LAB — URINE CULTURE

## 2019-11-16 ENCOUNTER — Encounter: Payer: Self-pay | Admitting: *Deleted

## 2020-11-12 ENCOUNTER — Other Ambulatory Visit: Payer: Self-pay | Admitting: Obstetrics & Gynecology

## 2020-11-12 DIAGNOSIS — Z1231 Encounter for screening mammogram for malignant neoplasm of breast: Secondary | ICD-10-CM

## 2020-12-05 ENCOUNTER — Other Ambulatory Visit: Payer: Self-pay

## 2020-12-05 ENCOUNTER — Ambulatory Visit
Admission: RE | Admit: 2020-12-05 | Discharge: 2020-12-05 | Disposition: A | Discharge status: Home / Self Care | Payer: No Typology Code available for payment source | Source: Ambulatory Visit | Attending: Obstetrics & Gynecology | Admitting: Obstetrics & Gynecology

## 2020-12-05 DIAGNOSIS — Z1231 Encounter for screening mammogram for malignant neoplasm of breast: Secondary | ICD-10-CM

## 2022-02-19 ENCOUNTER — Other Ambulatory Visit: Payer: Self-pay

## 2022-02-19 DIAGNOSIS — Z1231 Encounter for screening mammogram for malignant neoplasm of breast: Secondary | ICD-10-CM

## 2023-02-12 ENCOUNTER — Encounter (HOSPITAL_COMMUNITY): Payer: Self-pay

## 2023-02-12 ENCOUNTER — Ambulatory Visit (HOSPITAL_COMMUNITY)
Admission: EM | Admit: 2023-02-12 | Discharge: 2023-02-12 | Disposition: A | Discharge status: Home / Self Care | Payer: No Typology Code available for payment source | Attending: Emergency Medicine | Admitting: Emergency Medicine

## 2023-02-12 DIAGNOSIS — S29012A Strain of muscle and tendon of back wall of thorax, initial encounter: Secondary | ICD-10-CM | POA: Insufficient documentation

## 2023-02-12 LAB — POCT URINALYSIS DIP (MANUAL ENTRY)
Bilirubin, UA: NEGATIVE
Glucose, UA: NEGATIVE mg/dL
Ketones, POC UA: NEGATIVE mg/dL
Leukocytes, UA: NEGATIVE
Nitrite, UA: NEGATIVE
Protein Ur, POC: 30 mg/dL — AB
Spec Grav, UA: 1.015 (ref 1.010–1.025)
Urobilinogen, UA: 0.2 E.U./dL
pH, UA: >=9 — AB (ref 5.0–8.0)

## 2023-02-12 LAB — POCT URINE PREGNANCY: Preg Test, Ur: NEGATIVE

## 2023-02-12 MED ORDER — CYCLOBENZAPRINE HCL 10 MG PO TABS: 10.0000 mg | ORAL_TABLET | Freq: Two times a day (BID) | ORAL | 0 refills | Status: DC | PRN

## 2023-02-12 MED ORDER — IBUPROFEN 600 MG PO TABS: 600.0000 mg | ORAL_TABLET | Freq: Four times a day (QID) | ORAL | 0 refills | Status: DC

## 2023-02-12 NOTE — Discharge Instructions (Addendum)
Su orina no mostr signos de infeccin. Su dolor parece ser de Scientist, research (life sciences). Por favor descanse, aplique compresas tibias y estiramientos suaves en la zona. Puede tomar ibuprofeno, el antiinflamatorio, cada 6 horas segn sea necesario para el dolor y la inflamacin. Tambin puede probar el relajante muscular, ya que esto puede provocarle sueo. No beba ni conduzca con este medicamento. Debera ver una mejora gradual durante la prxima semana; si sus sntomas persisten sin mejora, puede regresar a la clnica para una reevaluacin.  Regrese a la clnica antes si presenta algn sntoma nuevo o urgente.  Your urine did not show signs of infection.  Your pain appears to be musculoskeletal in nature.  Please rest, do warm compresses and gentle stretching to the area.  You can take ibuprofen, the anti-inflammatory, every 6 hours as needed for pain and inflammation.  He can also trial the muscle relaxer, this may make you drowsy.  Do not drink or drive on this medication.  You should see some gradual improvement over the next week, if your symptoms persist without improvement you can return to clinic for reevaluation.  Return to clinic sooner for any new or urgent symptoms.

## 2023-02-12 NOTE — ED Triage Notes (Signed)
Patient here today with c/o pain in his right side upper pain that radiates to the back of her right upper arm since Monday. No known injury. Stretching helps. She is having difficulty sleeping.

## 2023-02-12 NOTE — ED Provider Notes (Signed)
[facility]    CSN: [id number] Arrival date & time: 02/12/23  0856      History   Chief Complaint Chief Complaint  Patient presents with   Back Pain    Right upper    HPI [place] is a 42 y.o. female.   Patient presents to clinic for right-sided thoracic back pain that started Monday.  Reports the pain will travel up her back and sometimes to her right arm.  She denies any known injuries or falls.  She does lift weights and exercises regularly, thinks this might have been the cause.  Reports a burning pain that is improved with gentle stretching.  She denies any dysuria or fever.  She did have nausea and a headache yesterday, reports this is since resolved.  Spanish-speaking medical language interpreter used for this encounter.  The history is provided by the patient and medical records. The history is limited by a language barrier.  Back Pain Associated symptoms: no abdominal pain, no dysuria and no fever     Past Medical History:  Diagnosis Date   Preterm labor     Patient Active Problem List   Diagnosis Date Noted   Postpartum care following cesarean delivery 05/20/2016   Status post C-section 09/23/2015    Past Surgical History:  Procedure Laterality Date   CERVICAL CERCLAGE  04/01/2012   Procedure: CERCLAGE CERVICAL;  Surgeon: Reva Bores, MD;  Location: [facility];  Service: Gynecology;  Laterality: N/A;   CERVICAL CERCLAGE N/A 09/22/2012   Procedure: CERCLAGE CERVICAL REMOVAL;  Surgeon: Adam Phenix, MD;  Location: [facility];  Service: Obstetrics;  Laterality: N/A;   CERVICAL CERCLAGE N/A 10/08/2015   Procedure: CERCLAGE CERVICAL;  Surgeon: Reva Bores, MD;  Location: [facility];  Service: Gynecology;  Laterality: N/A;   CERVICAL CERCLAGE N/A 04/07/2016   Procedure: REMOVAL OF CERVICAL CERCLAGE;  Surgeon: Levie Heritage, DO;  Location: [facility];  Service: Obstetrics;  Laterality: N/A;   CESAREAN SECTION     CESAREAN SECTION N/A  09/22/2012   Procedure: CESAREAN SECTION;  Surgeon: Adam Phenix, MD;  Location: [facility];  Service: Obstetrics;  Laterality: N/A;  Repeat   CESAREAN SECTION N/A 04/07/2016   Procedure: CESAREAN SECTION;  Surgeon: Levie Heritage, DO;  Location: [facility];  Service: Obstetrics;  Laterality: N/A;    OB History     Gravida  6   Para  6   Term  4   Preterm  2   AB  0   Living  4      SAB      IAB  0   Ectopic  0   Multiple  0   Live Births  5            Home Medications    Prior to Admission medications   Medication Sig Start Date End Date Taking? Authorizing Provider  cyclobenzaprine (FLEXERIL) 10 MG tablet Take 1 tablet (10 mg total) by mouth 2 (two) times daily as needed for muscle spasms. 02/12/23   Delayne Sanzo, [place] N, FNP  ibuprofen (ADVIL) 600 MG tablet Take 1 tablet (600 mg total) by mouth every 6 (six) hours. 02/12/23   Avilyn Virtue, [place] N, FNP    Family History Family History  Problem Relation Age of Onset   Hypertension Mother    Hypertension Father    Other Neg Hx     Social History Social History   Tobacco Use   Smoking status:  Never   Smokeless tobacco: Never  Substance Use Topics   Alcohol use: No   Drug use: No     Allergies   Patient has no known allergies.   Review of Systems Review of Systems  Constitutional:  Negative for fever.  Gastrointestinal:  Negative for abdominal pain.  Genitourinary:  Negative for dysuria and flank pain.  Musculoskeletal:  Positive for back pain.     Physical Exam Triage Vital Signs ED Triage Vitals  Encounter Vitals Group     BP 02/12/23 0929 132/88     Systolic BP Percentile --      Diastolic BP Percentile --      Pulse Rate 02/12/23 0929 77     Resp 02/12/23 0929 16     Temp 02/12/23 0929 98.2 F (36.8 C)     Temp Source 02/12/23 0929 Oral     SpO2 02/12/23 0929 99 %     Weight --      Height 02/12/23 0929 4\' 10"  (1.473 m)     Head Circumference --      Peak Flow --       Pain Score 02/12/23 0928 8     Pain Loc --      Pain Education --      Exclude from Growth Chart --    No data found.  Updated Vital Signs BP 132/88 (BP Location: Left Arm)   Pulse 77   Temp 98.2 F (36.8 C) (Oral)   Resp 16   Ht 4\' 10"  (1.473 m)   LMP 01/07/2023 (Exact Date)   SpO2 99%   Breastfeeding No   BMI 32.60 kg/m   Visual Acuity Right Eye Distance:   Left Eye Distance:   Bilateral Distance:    Right Eye Near:   Left Eye Near:    Bilateral Near:     Physical Exam Vitals and nursing note reviewed.  Constitutional:      Appearance: Normal appearance.  HENT:     Head: Normocephalic and atraumatic.     Right Ear: External ear normal.     Left Ear: External ear normal.     Nose: Nose normal.     Mouth/Throat:     Mouth: Mucous membranes are moist.  Eyes:     Conjunctiva/sclera: Conjunctivae normal.  Cardiovascular:     Rate and Rhythm: Normal rate.  Pulmonary:     Effort: Pulmonary effort is normal. No respiratory distress.  Musculoskeletal:        General: No tenderness or signs of injury. Normal range of motion.  Skin:    General: Skin is warm and dry.  Neurological:     General: No focal deficit present.     Mental Status: She is alert.  Psychiatric:        Mood and Affect: Mood normal.      UC Treatments / Results  Labs (all labs ordered are listed, but only abnormal results are displayed) Labs Reviewed  POCT URINALYSIS DIP (MANUAL ENTRY) - Abnormal; Notable for the following components:      Result Value   Blood, UA trace-intact (*)    pH, UA >=9.0 (*)    Protein Ur, POC =30 (*)    All other components within normal limits  URINE CULTURE  POCT URINE PREGNANCY    EKG   Radiology No results found.  Procedures Procedures (including critical care time)  Medications Ordered in UC Medications - No data to display  Initial Impression / Assessment and  Plan / UC Course  I have reviewed the triage vital signs and the nursing  notes.  Pertinent labs & imaging results that were available during my care of the patient were reviewed by me and considered in my medical decision making (see chart for details).  Vitals and triage reviewed, patient is hemodynamically stable.  Appears to have musculoskeletal thoracic back pain associated with weight lifting that is relieved with gentle stretching.  Without weakness, numbness or trauma.  Did have nausea and a headache yesterday.  Urinalysis with red blood cells and protein, will send for culture.  Low concern for urinary tract infection at this time.  Discussed this is most likely musculoskeletal strain, symptomatic management and return precautions given, no questions at this time.    Final Clinical Impressions(s) / UC Diagnoses   Final diagnoses:  Strain of thoracic back region     Discharge Instructions      Su orina no mostr signos de infeccin. Su dolor parece ser de Scientist, research (life sciences). Por favor descanse, aplique compresas tibias y estiramientos suaves en la zona. Puede tomar ibuprofeno, el antiinflamatorio, cada 6 horas segn sea necesario para el dolor y la inflamacin. Tambin puede probar el relajante muscular, ya que esto puede provocarle sueo. No beba ni conduzca con este medicamento. Debera ver una mejora gradual durante la prxima semana; si sus sntomas persisten sin mejora, puede regresar a la clnica para una reevaluacin.  Regrese a la clnica antes si presenta algn sntoma nuevo o urgente.  Your urine did not show signs of infection.  Your pain appears to be musculoskeletal in nature.  Please rest, do warm compresses and gentle stretching to the area.  You can take ibuprofen, the anti-inflammatory, every 6 hours as needed for pain and inflammation.  He can also trial the muscle relaxer, this may make you drowsy.  Do not drink or drive on this medication.  You should see some gradual improvement over the next week, if your symptoms persist  without improvement you can return to clinic for reevaluation.  Return to clinic sooner for any new or urgent symptoms.     ED Prescriptions     Medication Sig Dispense Auth. Provider   cyclobenzaprine (FLEXERIL) 10 MG tablet Take 1 tablet (10 mg total) by mouth 2 (two) times daily as needed for muscle spasms. 20 tablet Rinaldo Ratel, [place] N, [place]   ibuprofen (ADVIL) 600 MG tablet Take 1 tablet (600 mg total) by mouth every 6 (six) hours. 30 tablet Orange Hilligoss, [place] N, [place]      PDMP not reviewed this encounter.   Rinaldo Ratel [place] N, [place] 02/12/23 [phone]

## 2023-02-13 LAB — URINE CULTURE: Culture: <10000 — AB

## 2023-03-12 ENCOUNTER — Other Ambulatory Visit: Payer: Self-pay | Admitting: Obstetrics & Gynecology

## 2023-03-12 DIAGNOSIS — Z1231 Encounter for screening mammogram for malignant neoplasm of breast: Secondary | ICD-10-CM

## 2023-03-25 ENCOUNTER — Ambulatory Visit
Admission: RE | Admit: 2023-03-25 | Discharge: 2023-03-25 | Disposition: A | Discharge status: Home / Self Care | Payer: No Typology Code available for payment source | Source: Ambulatory Visit | Attending: Obstetrics & Gynecology | Admitting: Obstetrics & Gynecology

## 2023-03-25 DIAGNOSIS — Z1231 Encounter for screening mammogram for malignant neoplasm of breast: Secondary | ICD-10-CM

## 2023-03-30 ENCOUNTER — Other Ambulatory Visit: Payer: Self-pay

## 2023-03-30 DIAGNOSIS — R928 Other abnormal and inconclusive findings on diagnostic imaging of breast: Secondary | ICD-10-CM

## 2023-04-20 ENCOUNTER — Ambulatory Visit: Payer: No Typology Code available for payment source

## 2023-04-20 ENCOUNTER — Other Ambulatory Visit: Payer: No Typology Code available for payment source

## 2023-05-07 ENCOUNTER — Other Ambulatory Visit: Payer: Self-pay

## 2023-05-07 ENCOUNTER — Emergency Department (HOSPITAL_COMMUNITY): Payer: No Typology Code available for payment source

## 2023-05-07 ENCOUNTER — Encounter (HOSPITAL_COMMUNITY): Payer: Self-pay

## 2023-05-07 ENCOUNTER — Emergency Department (HOSPITAL_COMMUNITY)
Admission: EM | Admit: 2023-05-07 | Discharge: 2023-05-07 | Disposition: A | Discharge status: Home / Self Care | Payer: No Typology Code available for payment source | Attending: Emergency Medicine | Admitting: Emergency Medicine

## 2023-05-07 DIAGNOSIS — R1031 Right lower quadrant pain: Secondary | ICD-10-CM | POA: Insufficient documentation

## 2023-05-07 DIAGNOSIS — R1110 Vomiting, unspecified: Secondary | ICD-10-CM | POA: Insufficient documentation

## 2023-05-07 DIAGNOSIS — R1011 Right upper quadrant pain: Secondary | ICD-10-CM | POA: Insufficient documentation

## 2023-05-07 LAB — BASIC METABOLIC PANEL
Anion gap: 9 (ref 5–15)
BUN: 10 mg/dL (ref 6–20)
CO2: 21 mmol/L — ABNORMAL LOW (ref 22–32)
Calcium: 9.1 mg/dL (ref 8.9–10.3)
Chloride: 107 mmol/L (ref 98–111)
Creatinine, Ser: 0.93 mg/dL (ref 0.44–1.00)
GFR, Estimated: >60 mL/min (ref >60)
Glucose, Bld: 100 mg/dL — ABNORMAL HIGH (ref 70–99)
Potassium: 3.8 mmol/L (ref 3.5–5.1)
Sodium: 137 mmol/L (ref 135–145)

## 2023-05-07 LAB — URINALYSIS, ROUTINE W REFLEX MICROSCOPIC
Bacteria, UA: NONE SEEN
Bilirubin Urine: NEGATIVE
Glucose, UA: NEGATIVE mg/dL
Ketones, ur: NEGATIVE mg/dL
Leukocytes,Ua: NEGATIVE
Nitrite: NEGATIVE
Protein, ur: NEGATIVE mg/dL
Specific Gravity, Urine: 1.006 (ref 1.005–1.030)
pH: 7 (ref 5.0–8.0)

## 2023-05-07 LAB — HEPATIC FUNCTION PANEL
ALT: 16 U/L (ref 0–44)
AST: 22 U/L (ref 15–41)
Albumin: 4 g/dL (ref 3.5–5.0)
Alkaline Phosphatase: 42 U/L (ref 38–126)
Bilirubin, Direct: 0.2 mg/dL (ref 0.0–0.2)
Indirect Bilirubin: 0.8 mg/dL (ref 0.3–0.9)
Total Bilirubin: 1 mg/dL (ref 0.3–1.2)
Total Protein: 7 g/dL (ref 6.5–8.1)

## 2023-05-07 LAB — LIPASE, BLOOD: Lipase: 31 U/L (ref 11–51)

## 2023-05-07 LAB — CBC
HCT: 40.2 % (ref 36.0–46.0)
Hemoglobin: 14 g/dL (ref 12.0–15.0)
MCH: 29.9 pg (ref 26.0–34.0)
MCHC: 34.8 g/dL (ref 30.0–36.0)
MCV: 85.9 fL (ref 80.0–100.0)
Platelets: 277 10*3/uL (ref 150–400)
RBC: 4.68 MIL/uL (ref 3.87–5.11)
RDW: 11.5 % (ref 11.5–15.5)
WBC: 6.5 10*3/uL (ref 4.0–10.5)
nRBC: 0 % (ref 0.0–0.2)

## 2023-05-07 LAB — HCG, SERUM, QUALITATIVE: Preg, Serum: NEGATIVE

## 2023-05-07 MED ORDER — IOHEXOL 350 MG/ML SOLN
75.0000 mL | Freq: Once | INTRAVENOUS | Status: AC | PRN
  Administered 2023-05-07: 75 mL via INTRAVENOUS

## 2023-05-07 MED ORDER — OMEPRAZOLE 20 MG PO CPDR: 20.0000 mg | DELAYED_RELEASE_CAPSULE | Freq: Every day | ORAL | 1 refills | Status: AC

## 2023-05-07 MED ORDER — HYDROMORPHONE HCL 1 MG/ML IJ SOLN
0.5000 mg | Freq: Once | INTRAMUSCULAR | Status: AC
  Administered 2023-05-07: 0.5 mg via INTRAVENOUS
  Filled 2023-05-07: qty 1

## 2023-05-07 MED ORDER — ONDANSETRON HCL 4 MG/2ML IJ SOLN
4.0000 mg | Freq: Once | INTRAMUSCULAR | Status: AC
  Administered 2023-05-07: 4 mg via INTRAVENOUS
  Filled 2023-05-07: qty 2

## 2023-05-07 NOTE — Discharge Instructions (Addendum)
Symptoms could be secondary to peptic ulcer disease.  Make an appointment to follow-up with gastroenterology.  Information provided above.  Take the medication as prescribed that can possibly heal a stomach ulcer if you have 1.  Take the medicine every day for at least 15 days.  If it seems to help you can continue it and you have a refill.  Return for any new or worse symptoms.  Also would avoid spicy foods.

## 2023-05-07 NOTE — ED Triage Notes (Addendum)
Pt c/o right flank [id number]. Pt c/o vomiting mostly in the morning. Pt denies urinary issues.

## 2023-05-07 NOTE — ED Provider Notes (Addendum)
[facility] Provider Note   CSN: [id number] Arrival date & time: 05/07/23  4098     History  Chief Complaint  Patient presents with   Flank Pain    Teresa Day is a 42 y.o. female.  Patient with complaint for 2 months of right-sided abdominal pain.  Right lower quadrant up to right upper quadrant.  But also has radiation up to the right shoulder.  Patient's had some vomiting mostly in the morning.  Patient denies any urinary symptoms.  Patient last seen in July did not have the pain at that time.  Past medical history significant just for preterm labor.  Patient's last delivery was in 2014.  Patient is never used tobacco products.  Spanish interpreter used.  Get some additional information.  Patient seems to think that food initially makes it feel better.  Raising some question about peptic ulcer disease.       Home Medications Prior to Admission medications   Medication Sig Start Date End Date Taking? Authorizing Provider  cyclobenzaprine (FLEXERIL) 10 MG tablet Take 1 tablet (10 mg total) by mouth 2 (two) times daily as needed for muscle spasms. 02/12/23   Garrison, [place] N, FNP  ibuprofen (ADVIL) 600 MG tablet Take 1 tablet (600 mg total) by mouth every 6 (six) hours. 02/12/23   Garrison, [place] N, FNP      Allergies    Patient has no known allergies.    Review of Systems   Review of Systems  Constitutional:  Negative for chills and fever.  HENT:  Negative for ear pain and sore throat.   Eyes:  Negative for pain and visual disturbance.  Respiratory:  Negative for cough and shortness of breath.   Cardiovascular:  Negative for chest pain and palpitations.  Gastrointestinal:  Positive for abdominal pain, nausea and vomiting.  Genitourinary:  Negative for dysuria and hematuria.  Musculoskeletal:  Negative for arthralgias and back pain.  Skin:  Negative for color change and rash.  Neurological:  Negative for  seizures and syncope.  All other systems reviewed and are negative.   Physical Exam Updated Vital Signs BP 126/86 (BP Location: Right Arm)   Pulse 85   Temp 98.3 F (36.8 C) (Oral)   Resp 16   Ht 1.473 m (4\' 10" )   Wt 70.8 kg   SpO2 100%   BMI 32.62 kg/m  Physical Exam Vitals and nursing note reviewed.  Constitutional:      General: She is not in acute distress.    Appearance: Normal appearance. She is well-developed.  HENT:     Head: Normocephalic and atraumatic.     Mouth/Throat:     Mouth: Mucous membranes are moist.  Eyes:     Extraocular Movements: Extraocular movements intact.     Conjunctiva/sclera: Conjunctivae normal.     Pupils: Pupils are equal, round, and reactive to light.  Cardiovascular:     Rate and Rhythm: Normal rate and regular rhythm.     Heart sounds: No murmur heard. Pulmonary:     Effort: Pulmonary effort is normal. No respiratory distress.     Breath sounds: Normal breath sounds.  Abdominal:     Palpations: Abdomen is soft.     Tenderness: There is abdominal tenderness. There is no guarding.     Comments: Tender mostly right upper quadrant.  Some mild tenderness right lower quadrant.  Musculoskeletal:        General: No swelling.     Cervical  back: Neck supple.  Skin:    General: Skin is warm and dry.     Capillary Refill: Capillary refill takes less than 2 seconds.  Neurological:     General: No focal deficit present.     Mental Status: She is alert and oriented to person, place, and time.  Psychiatric:        Mood and Affect: Mood normal.     ED Results / Procedures / Treatments   Labs (all labs ordered are listed, but only abnormal results are displayed) Labs Reviewed  BASIC METABOLIC PANEL  CBC  URINALYSIS, ROUTINE W REFLEX MICROSCOPIC  HCG, SERUM, QUALITATIVE  HEPATIC FUNCTION PANEL  LIPASE, BLOOD    EKG None  Radiology No results found.  Procedures Procedures    Medications Ordered in ED Medications - No data to  display  ED Course/ Medical Decision Making/ A&P                                 Medical Decision Making Amount and/or Complexity of Data Reviewed Labs: ordered. Radiology: ordered.  Risk Prescription drug management.   Patient with right side abdominal tenderness.  Will get CT scan.  Labs will include liver function test.  Clinically could be gallbladder also could be appendicitis.  Also need to make sure she is not pregnant.  Duration of the pain more concerning may cholelithiasis.  Workup here lipase normal basic metabolic panel normal liver function test normal CBC normal.  No leukocytosis.  Pregnancy test negative.  CT abdomen and pelvis showed evidence of bilateral ovarian cystic lesions left greater than right.  Do not think this is the cause of the pain.  They said no specific follow-up required and was a concern for torsion and patient is not acting like there is ovarian torsion.  More importantly it said that the stomach was under distended but there was slight more wall thickening than expected even for this level of distention.  Please correlate with any gastric symptoms and additional evaluation as clinically appropriate prominent adjacent vascular as well not described above.  So this could possibly may be represent some inflammatory changes in the stomach.  So we will going to treat her with omeprazole and will have her follow-up with GI medicine for consideration for upper endoscopy.  Patient did not want any pain medication for home.   Final Clinical Impression(s) / ED Diagnoses Final diagnoses:  Right upper quadrant abdominal pain    Rx / DC Orders ED Discharge Orders     None         Vanetta Mulders, MD 05/07/23 1914    Vanetta Mulders, MD 05/07/23 [phone]

## 2023-05-27 ENCOUNTER — Other Ambulatory Visit: Payer: No Typology Code available for payment source

## 2023-05-27 ENCOUNTER — Ambulatory Visit: Payer: No Typology Code available for payment source

## 2023-06-07 ENCOUNTER — Ambulatory Visit: Payer: Self-pay | Admitting: Gastroenterology

## 2023-06-07 ENCOUNTER — Encounter: Payer: Self-pay | Admitting: Gastroenterology

## 2023-06-07 VITALS — BP 142/96 | HR 88 | Ht 58.5 in | Wt 168.4 lb

## 2023-06-07 DIAGNOSIS — R1011 Right upper quadrant pain: Secondary | ICD-10-CM

## 2023-06-07 DIAGNOSIS — R109 Unspecified abdominal pain: Secondary | ICD-10-CM

## 2023-06-07 NOTE — Patient Instructions (Addendum)
_______________________________________________________  If your blood pressure at your visit was 140/90 or greater, please contact your primary care physician to follow up on this.  _______________________________________________________  If you are age 42 or older, your body mass index should be between 23-30. Your Body mass index is 34.6 kg/m. If this is out of the aforementioned range listed, please consider follow up with your Primary Care Provider.  If you are age 56 or younger, your body mass index should be between 19-25. Your Body mass index is 34.6 kg/m. If this is out of the aformentioned range listed, please consider follow up with your Primary Care Provider.   ________________________________________________________   The  GI providers would like to encourage you to use [facility] [address] to communicate with providers for non-urgent requests or questions.  Due to long hold times on the telephone, sending your provider a message by [facility] may be a faster and more efficient way to get a response.  Please allow 48 business hours for a response.  Please remember that this is for non-urgent requests.  _______________________________________________________   Jorene Minors usar almohadillas trmicas o heladas para el dolor.  Continuar con omeprazol 20 mg al da.  Le han programado una endoscopia. Siga las instrucciones escritas que se le dieron en su visita de hoy.  Given estimated cost for procedure  Can do icyhot/heating pads for pain  Continue omeprazole 20mg  daily  You have been scheduled for an endoscopy. Please follow written instructions given to you at your visit today.  If you use inhalers (even only as needed), please bring them with you on the day of your procedure.  If you take any of the following medications, they will need to be adjusted prior to your procedure:   DO NOT TAKE 7 DAYS PRIOR TO TEST- Trulicity (dulaglutide) Ozempic, Wegovy (semaglutide) Mounjaro  (tirzepatide) Bydureon Bcise (exanatide extended release)  DO NOT TAKE 1 DAY PRIOR TO YOUR TEST Rybelsus (semaglutide) Adlyxin (lixisenatide) Victoza (liraglutide) Byetta (exanatide) ___________________________________________________________________________  Thank you,  Dr. Lynann Bologna

## 2023-06-07 NOTE — Progress Notes (Signed)
Chief Complaint:   Referring Provider:  Lance Bosch, NP      ASSESSMENT AND PLAN;   #1. RUQ pain with N/V. Abn CT showing gastric wall thickening. Nl CBC, CMP, lipase.  #2. R flank pain- likely musculosketal    Plan: -EGD -Omerazole 20mg  po every day -icy hot/ben gay/heating pads for musculoskeletal component of right flank pain -Copy on CT scan given to patient -If still with problems, [place] followed by HIDA with EF.   HPI:    [place] is a 42 y.o. female  Who speaks little English History through interpreter  FU from ED visit 05/07/2023 with right upper quadrant abdominal pain/right flank pain Had normal CBC, CMP, lipase Negative CT Abdo/pelvis except for ?Gastric wall thickening vs underdistention Given trial of omeprazole.  She describes 2 types of pains -1 which is more constant in the R back/flank, exacerbated by lifting, walking -The other 1 which is intermittent, worse with eating especially fatty foods, associated with nausea and a sensation of "lump" in the right upper quadrant.  No vomiting currently but at times she does throw up in the morning.  She has occasional heartburn.  No odynophagia or dysphagia.  No significant nonsteroidals  She denies having any significant diarrhea or constipation.  No melena or hematochezia  No sodas, chocolates, chewing gums, artificial sweeteners and candy. No NSAIDs    Previous GI workup:  CT AP with contrast 05/07/2023 IMPRESSION: No bowel obstruction, free air or free fluid.  Normal appendix. Stomach is underdistended but there is slight more wall thickening than expected even for this level distention. Please correlate with any gastric symptoms and additional evaluation as clinically appropriate. Prominent adjacent vasculature as well, not described above. Bilateral ovarian cystic lesions. Largest on the left measuring up to 4.5 cm. No follow-up imaging recommended in the absence  of symptoms. Past Medical History:  Diagnosis Date   Preterm labor     Past Surgical History:  Procedure Laterality Date   CERVICAL CERCLAGE  04/01/2012   Procedure: CERCLAGE CERVICAL;  Surgeon: Reva Bores, MD;  Location: [facility];  Service: Gynecology;  Laterality: N/A;   CERVICAL CERCLAGE N/A 09/22/2012   Procedure: CERCLAGE CERVICAL REMOVAL;  Surgeon: Adam Phenix, MD;  Location: [facility];  Service: Obstetrics;  Laterality: N/A;   CERVICAL CERCLAGE N/A 10/08/2015   Procedure: CERCLAGE CERVICAL;  Surgeon: Reva Bores, MD;  Location: [facility];  Service: Gynecology;  Laterality: N/A;   CERVICAL CERCLAGE N/A 04/07/2016   Procedure: REMOVAL OF CERVICAL CERCLAGE;  Surgeon: Levie Heritage, DO;  Location: [facility] BIRTHING SUITES;  Service: Obstetrics;  Laterality: N/A;   CESAREAN SECTION     CESAREAN SECTION N/A 09/22/2012   Procedure: CESAREAN SECTION;  Surgeon: Adam Phenix, MD;  Location: [facility];  Service: Obstetrics;  Laterality: N/A;  Repeat   CESAREAN SECTION N/A 04/07/2016   Procedure: CESAREAN SECTION;  Surgeon: Levie Heritage, DO;  Location: [facility] BIRTHING SUITES;  Service: Obstetrics;  Laterality: N/A;    Family History  Problem Relation Age of Onset   Hypertension Mother    Hypertension Father    Diabetes Sister    Other Neg Hx     Social History   Tobacco Use   Smoking status: Never   Smokeless tobacco: Never  Vaping Use   Vaping status: Never Used  Substance Use Topics   Alcohol use: No   Drug use: No    Current Outpatient Medications  Medication Sig Dispense Refill  omeprazole (PRILOSEC) 20 MG capsule Take 1 capsule (20 mg total) by mouth daily. (Patient not taking: Reported on 06/07/2023) 15 capsule 1   No current facility-administered medications for this visit.    No Known Allergies  Review of Systems:  Constitutional: Denies fever, chills, diaphoresis, appetite change and fatigue.  HEENT: Denies photophobia, eye pain, redness, hearing loss, ear pain,  congestion, sore throat, rhinorrhea, sneezing, mouth sores, neck pain, neck stiffness and tinnitus.   Respiratory: Denies SOB, DOE, cough, chest tightness,  and wheezing.   Cardiovascular: Denies chest pain, palpitations and leg swelling.  Genitourinary: Denies dysuria, urgency, frequency, hematuria, flank pain and difficulty urinating.  Musculoskeletal: Denies myalgias, back pain, joint swelling, arthralgias and gait problem.  Skin: No rash.  Neurological: Denies dizziness, seizures, syncope, weakness, light-headedness, numbness and headaches.  Hematological: Denies adenopathy. Easy bruising, personal or family bleeding history  Psychiatric/Behavioral: No anxiety or depression     Physical Exam:    BP (!) 140/94 (BP Location: Left Arm, Patient Position: Sitting, Cuff Size: Normal)   Pulse 88   Ht 4' 10.5" (1.486 m) Comment: height measured without shoes  Wt 168 lb 7 oz (76.4 kg)   LMP 06/03/2023   BMI 34.60 kg/m  Wt Readings from Last 3 Encounters:  06/07/23 168 lb 7 oz (76.4 kg)  05/07/23 156 lb 1.4 oz (70.8 kg)  12/13/18 156 lb (70.8 kg)   Constitutional:  Well-developed, in no acute distress. Psychiatric: Normal mood and affect. Behavior is normal. HEENT: Pupils normal.  Conjunctivae are normal. No scleral icterus. Neck supple.  Cardiovascular: Normal rate, regular rhythm. No edema Pulmonary/chest: Effort normal and breath sounds normal. No wheezing, rales or rhonchi. Abdominal: Soft, nondistended. Nontender. Bowel sounds active throughout. There are no masses palpable. No hepatomegaly. Rectal: Deferred Neurological: Alert and oriented to person place and time. Skin: Skin is warm and dry. No rashes noted.  Data Reviewed: I have personally reviewed following labs and imaging studies  CBC:    Latest Ref Rng & Units 05/07/2023    7:32 AM 04/08/2016    5:15 AM 04/07/2016    1:27 PM  CBC  WBC 4.0 - 10.5 K/uL 6.5  11.2  14.6   Hemoglobin 12.0 - 15.0 g/dL 16.1  09.6  04.5    Hematocrit 36.0 - 46.0 % 40.2  32.8  34.6   Platelets 150 - 400 K/uL 277  197  230     CMP:    Latest Ref Rng & Units 05/07/2023    7:32 AM  CMP  Glucose 70 - 99 mg/dL 409   BUN 6 - 20 mg/dL 10   Creatinine 8.11 - 1.00 mg/dL 9.14   Sodium 782 - 956 mmol/L 137   Potassium 3.5 - 5.1 mmol/L 3.8   Chloride 98 - 111 mmol/L 107   CO2 22 - 32 mmol/L 21   Calcium 8.9 - 10.3 mg/dL 9.1   Total Protein 6.5 - 8.1 g/dL 7.0   Total Bilirubin 0.3 - 1.2 mg/dL 1.0   Alkaline Phos 38 - 126 U/L 42   AST 15 - 41 U/L 22   ALT 0 - 44 U/L 16       Edman Circle, MD 06/07/2023, 3:22 PM  Cc: Lance Bosch, NP

## 2023-06-12 ENCOUNTER — Encounter: Payer: Self-pay | Admitting: Certified Registered Nurse Anesthetist

## 2023-06-14 ENCOUNTER — Ambulatory Visit (AMBULATORY_SURGERY_CENTER): Payer: Self-pay | Admitting: Gastroenterology

## 2023-06-14 ENCOUNTER — Encounter: Payer: Self-pay | Admitting: Gastroenterology

## 2023-06-14 VITALS — BP 109/79 | HR 76 | Temp 98.5°F | Resp 12 | Ht 58.5 in | Wt 168.0 lb

## 2023-06-14 DIAGNOSIS — B9681 Helicobacter pylori [H. pylori] as the cause of diseases classified elsewhere: Secondary | ICD-10-CM

## 2023-06-14 DIAGNOSIS — K2950 Unspecified chronic gastritis without bleeding: Secondary | ICD-10-CM

## 2023-06-14 DIAGNOSIS — R109 Unspecified abdominal pain: Secondary | ICD-10-CM

## 2023-06-14 DIAGNOSIS — R1011 Right upper quadrant pain: Secondary | ICD-10-CM

## 2023-06-14 DIAGNOSIS — K2970 Gastritis, unspecified, without bleeding: Secondary | ICD-10-CM

## 2023-06-14 MED ORDER — SODIUM CHLORIDE 0.9 % IV SOLN: 500.0000 mL | Freq: Once | INTRAVENOUS | Status: DC

## 2023-06-14 NOTE — Progress Notes (Signed)
Called to room to assist during endoscopic procedure.  Patient ID and intended procedure confirmed with present staff. Received instructions for my participation in the procedure from the performing physician.  

## 2023-06-14 NOTE — Progress Notes (Signed)
Report given to PACU, vss 

## 2023-06-14 NOTE — Progress Notes (Signed)
[facility] History and Physical   Primary Care Physician:  Pcp, No   Reason for Procedure:   Abn CT  showing gastric wall thickening.  Patient with right upper quadrant abdominal pain.  Plan:    egd     HPI: Teresa Day is a 42 y.o. female    Past Medical History:  Diagnosis Date   Preterm labor     Past Surgical History:  Procedure Laterality Date   CERVICAL CERCLAGE  04/01/2012   Procedure: CERCLAGE CERVICAL;  Surgeon: Reva Bores, MD;  Location: [facility];  Service: Gynecology;  Laterality: N/A;   CERVICAL CERCLAGE N/A 09/22/2012   Procedure: CERCLAGE CERVICAL REMOVAL;  Surgeon: Adam Phenix, MD;  Location: [facility];  Service: Obstetrics;  Laterality: N/A;   CERVICAL CERCLAGE N/A 10/08/2015   Procedure: CERCLAGE CERVICAL;  Surgeon: Reva Bores, MD;  Location: [facility];  Service: Gynecology;  Laterality: N/A;   CERVICAL CERCLAGE N/A 04/07/2016   Procedure: REMOVAL OF CERVICAL CERCLAGE;  Surgeon: Levie Heritage, DO;  Location: [facility] BIRTHING SUIT[facility];  Service: Obstetrics;  Laterality: N/A;   CESAREAN SECTION     CESAREAN SECTION N/A 09/22/2012   Procedure: CESAREAN SECTION;  Surgeon: Adam Phenix, MD;  Location: [facility];  Service: Obstetrics;  Laterality: N/A;  Repeat   CESAREAN SECTION N/A 04/07/2016   Procedure: CESAREAN SECTION;  Surgeon: Levie Heritage, DO;  Location: [facility]THING SUIT[facility];  Service: Obstetrics;  Laterality: N/A;    Prior to Admission medications   Medication Sig Start Date End Date Taking? Authorizing Provider  NON FORMULARY Take by mouth. Birth Control pills - Pt is unsure of name   Yes [provider]  omeprazole (PRILOSEC) 20 MG capsule Take 1 capsule (20 mg total) by mouth daily. 05/07/23  Yes Vanetta Mulders, MD    Current Outpatient Medications  Medication Sig Dispense Refill   NON FORMULARY Take by mouth. Birth Control pills - Pt is unsure of name     omeprazole (PRILOSEC) 20 MG capsule Take 1 capsule (20 mg total) by  mouth daily. 15 capsule 1   Current Facility-Administered Medications  Medication Dose Route Frequency Provider Last Rate Last Admin   0.9 %  sodium chloride infusion  500 mL Intravenous Once Lynann Bologna, MD        Allergies as of 06/14/2023   (No Known Allergies)    Family History  Problem Relation Age of Onset   Hypertension Mother    Hypertension Father    Diabetes Sister    Other Neg Hx     Social History   Socioeconomic History   Marital status: Married    Spouse name: Not on file   Number of children: 4   Years of education: Not on file   Highest education level: Not on file  Occupational History   Not on file  Tobacco Use   Smoking status: Never   Smokeless tobacco: Never  Vaping Use   Vaping status: Never Used  Substance and Sexual Activity   Alcohol use: No   Drug use: No   Sexual activity: Never    Birth control/protection: None  Other Topics Concern   Not on file  Social History Narrative   Not on file   Social Determinants of Health   Financial Resource Strain: Not on file  Food Insecurity: Not on file  Transportation Needs: Not on file  Physical Activity: Not on file  Stress: Not on file  Social Connections: Not on  file  Intimate Partner Violence: Not on file    Review of Systems: Positive for none All other review of systems negative except as mentioned in the HPI.  Physical Exam: Vital signs in last 24 hours: @VSRANGES @   General:   Alert,  Well-developed, well-nourished, pleasant and cooperative in NAD Lungs:  Clear throughout to auscultation.   Heart:  Regular rate and rhythm; no murmurs, clicks, rubs,  or gallops. Abdomen:  Soft, nontender and nondistended. Normal bowel sounds.   Neuro/Psych:  Alert and cooperative. Normal mood and affect. A and O x 3    No significant changes were identified.  The patient continues to be an appropriate candidate for the planned procedure and anesthesia.   Edman Circle, MD. [facility]  Gastroenterology 06/14/2023 8:04 AM@

## 2023-06-14 NOTE — Patient Instructions (Addendum)
Resume previous diet Continue present medications, take Omeprazole(PRILOSEC) as ordered, EVERY DAY 30 minutes before eating Await pathology results  Handouts/information given for gastritis, GERD  USTED TUVO UN PROCEDIMIENTO ENDOSCPICO HOY EN EL [facility]:   Lea el informe del procedimiento que se le entreg para cualquier pregunta especfica sobre lo que se Dentist.  Si el informe del examen no responde a sus preguntas, por favor llame a su gastroenterlogo para aclararlo.  Si usted solicit que no se le den [employer] de lo que se Clinical cytogeneticist en su procedimiento al [employer] va a cuidar, entonces el informe del procedimiento se ha incluido en un sobre sellado para que usted lo revise despus cuando le sea ms conveniente.   LO QUE PUEDE ESPERAR: Algunas sensaciones de hinchazn en el abdomen.  Puede tener ms gases de lo normal.  El caminar puede ayudarle a eliminar el aire que se le puso en el tracto gastrointestinal durante el procedimiento y reducir la hinchazn.  Si le hicieron una endoscopia inferior (como una colonoscopia o una sigmoidoscopia flexible), podra notar manchas de sangre en las heces fecales o en el papel higinico.  Si se someti a una preparacin intestinal para su procedimiento, es posible que no tenga una evacuacin intestinal normal durante [employer].   Tenga en cuenta:  Es posible que note un poco de irritacin y congestin en la nariz o algn drenaje.  Esto es debido al oxgeno [employer] durante su procedimiento.  No hay que preocuparse y esto debe desaparecer ms o Regulatory affairs officer.   SNTOMAS PARA REPORTAR INMEDIATAMENTE:   Despus de la endoscopia superior (EGD)  Vmitos de Retail buyer o material como caf molido   Dolor en el pecho o dolor debajo de los omplatos que antes no tena   Dolor o dificultad persistente para tragar  Falta de aire que antes no tena   Fiebre de 100F o ms  Heces fecales negras y pegajosas   Para  asuntos urgentes o de Associate Professor, puede comunicarse con un gastroenterlogo a cualquier hora llamando al [phone].  DIETA:  Recomendamos una comida pequea al principio, pero luego puede continuar con su dieta normal.  Tome muchos lquidos, Tax adviser las bebidas alcohlicas durante 24 horas.    ACTIVIDAD:  Debe planear tomarse las cosas con calma por el resto del da y no debe CONDUCIR ni usar maquinaria pesada Patent examiner (debido a los medicamentos de sedacin utilizados durante el examen).     SEGUIMIENTO: Nuestro personal llamar al nmero que aparece en su historial al siguiente da hbil de su procedimiento para ver cmo se siente y para responder cualquier pregunta o inquietud que pueda tener con respecto a la informacin que se le dio despus del procedimiento. Si no podemos contactarle, le dejaremos un mensaje.  Sin embargo, si se siente bien y no tiene English as a second language teacher, no es necesario que nos devuelva la llamada.  Asumiremos que ha regresado a sus actividades diarias normales sin incidentes. Si se le tomaron algunas biopsias, le contactaremos por telfono o por carta en las prximas 3 semanas.  Si no ha sabido [employer] biopsias en el transcurso de 3 semanas, por favor llmenos al [phone].   FIRMAS/CONFIDENCIALIDAD: Usted y/o el acompaante que le cuide han firmado documentos que se ingresarn en su historial mdico electrnico.  Estas firmas atestiguan el hecho de que la informacin anterior

## 2023-06-14 NOTE — Op Note (Signed)
[facility] Patient Name: [place] Procedure Date: 06/14/2023 7:32 AM MRN: [id number] Endoscopist: Lynann Bologna , MD, [id number] Age: 42 Referring MD:  Date of Birth: 03-Mar-1981 Gender: Female Account #: [id number] Procedure:                Upper GI endoscopy Indications:              Abdominal pain in the right upper quadrant with CT                            scan showing gastric wall thickening. Medicines:                Monitored Anesthesia Care Procedure:                Pre-Anesthesia Assessment:                           - Prior to the procedure, a History and Physical                            was performed, and patient medications and                            allergies were reviewed. The patient's tolerance of                            previous anesthesia was also reviewed. The risks                            and benefits of the procedure and the sedation                            options and risks were discussed with the patient.                            All questions were answered, and informed consent                            was obtained. Prior Anticoagulants: The patient has                            taken no anticoagulant or antiplatelet agents. ASA                            Grade Assessment: II - A patient with mild systemic                            disease. After reviewing the risks and benefits,                            the patient was deemed in satisfactory condition to                            undergo the procedure.  After obtaining informed consent, the endoscope was                            passed under direct vision. Throughout the                            procedure, the patient's blood pressure, pulse, and                            oxygen saturations were monitored continuously. The                            Olympus Scope [id number] was introduced through the                             mouth, and advanced to the second part of duodenum.                            The upper GI endoscopy was accomplished without                            difficulty. The patient tolerated the procedure                            well. Scope In: Scope Out: Findings:                 The examined esophagus was normal.                           The Z-line was regular and was found 35 cm from the                            incisors.                           Localized minimal inflammation characterized by                            erythema was found in the entire examined stomach.                            Biopsies were taken with a cold forceps for                            histology using Sydney protocol.                           The examined duodenum was normal. Biopsies for                            histology were taken with a cold forceps for                            evaluation of celiac disease. Complications:  No immediate complications. Estimated Blood Loss:     Estimated blood loss: none. Impression:               - Normal esophagus.                           - Z-line regular, 35 cm from the incisors.                           - Gastritis. Biopsied.                           - Normal examined duodenum. Biopsied. Recommendation:           - Patient has a contact number available for                            emergencies. The signs and symptoms of potential                            delayed complications were discussed with the                            patient. Return to normal activities tomorrow.                            Written discharge instructions were provided to the                            patient.                           - Resume previous diet.                           - Continue present medications.                           - Await pathology results.                           - If still with problems, further workup by means                             of GES/US.                           - The findings and recommendations were discussed                            with the patient's family. Lynann Bologna, MD 06/14/2023 8:17:35 AM This report has been signed electronically.

## 2023-06-14 NOTE — Progress Notes (Signed)
0758 Robinul 0.1 mg IV given due large amount of secretions upon assessment.  MD made aware, vss 

## 2023-06-14 NOTE — Progress Notes (Signed)
Pt's states no medical or surgical changes since previsit or office visit. 

## 2023-06-15 ENCOUNTER — Telehealth: Payer: Self-pay | Admitting: *Deleted

## 2023-06-15 NOTE — Telephone Encounter (Signed)
No answer for post procedure follow up and unable to leave VM.

## 2023-06-17 LAB — SURGICAL PATHOLOGY

## 2023-06-19 ENCOUNTER — Encounter: Payer: Self-pay | Admitting: Gastroenterology

## 2023-06-22 ENCOUNTER — Other Ambulatory Visit: Payer: Self-pay

## 2023-06-22 DIAGNOSIS — A048 Other specified bacterial intestinal infections: Secondary | ICD-10-CM

## 2023-06-24 ENCOUNTER — Ambulatory Visit: Admission: RE | Admit: 2023-06-24 | Payer: No Typology Code available for payment source | Source: Ambulatory Visit

## 2023-06-24 ENCOUNTER — Ambulatory Visit: Payer: Self-pay | Admitting: Hematology and Oncology

## 2023-06-24 ENCOUNTER — Ambulatory Visit
Admission: RE | Admit: 2023-06-24 | Discharge: 2023-06-24 | Disposition: A | Discharge status: Home / Self Care | Payer: No Typology Code available for payment source | Source: Ambulatory Visit | Attending: Obstetrics and Gynecology | Admitting: Obstetrics and Gynecology

## 2023-06-24 VITALS — BP 139/88 | Wt 164.0 lb

## 2023-06-24 DIAGNOSIS — R928 Other abnormal and inconclusive findings on diagnostic imaging of breast: Secondary | ICD-10-CM

## 2023-06-24 NOTE — Progress Notes (Signed)
Ms. Teresa Day is a 42 y.o. female who presents to [facility] clinic today with no complaints. Callback for possible left breast asymmetry.   Pap Smear: Pap not smear completed today. Last Pap smear was 02/2023 and was normal. Per patient has no history of an abnormal Pap smear. Last Pap smear result is available in Epic.   Physical exam: Breasts Breasts symmetrical. No skin abnormalities bilateral breasts. No nipple retraction bilateral breasts. No nipple discharge bilateral breasts. No lymphadenopathy. No lumps palpated bilateral breasts.       Pelvic/Bimanual Pap is not indicated today    Smoking History: Patient has never smoked and was not referred to quit line.    Patient Navigation: Patient education provided. Access to services provided for patient through BCCCP program. Teresa Day interpreter provided. No transportation provided   Colorectal Cancer Screening: Per patient has never had colonoscopy completed No complaints today.    Breast and Cervical Cancer Risk Assessment: Patient does not have family history of breast cancer, known genetic mutations, or radiation treatment to the chest before age 37. Patient does not have history of cervical dysplasia, immunocompromised, or DES exposure in-utero.  Risk Scores as of Encounter on 06/24/2023     Teresa Day           5-year 0.59%   Lifetime 8.87%   This patient is Hisp[place]/Latina but has no documented birth country, so the [place] model used data from [place] patients to calculate their risk score. Document a birth country in the Demographics activity for a more accurate score.         Last calculated by Teresa Day, CMA on 06/24/2023 at  8:40 AM        A: BCCCP exam without pap smear No complaints with benign exam. Callback for possible left breast asymmetry.  P: Referred patient to the [facility] for a screening mammogram. Appointment scheduled 06/24/2023.  Teresa Day A,  NP 06/24/2023 11:00 AM

## 2023-06-24 NOTE — Patient Instructions (Signed)
Taught [name] about self breast awareness and gave educational materials to take home. Patient did not need a Pap smear today due to last Pap smear was in 07/24 per patient.  Let her know B[facility]CP will cover Pap smears every 5 years unless has a history of abnormal Pap smears. Referred patient to the [facility] for diagnostic mammogram. Appointment scheduled for 06/24/2023. Patient aware of appointment and will be there. Let patient know will follow up with her within the next couple weeks with results. Teresa Day verbalized understanding.  Pascal Lux, NP 11:03 AM

## 2023-06-28 ENCOUNTER — Other Ambulatory Visit: Payer: Self-pay

## 2023-06-28 DIAGNOSIS — A048 Other specified bacterial intestinal infections: Secondary | ICD-10-CM

## 2023-06-28 MED ORDER — CLARITHROMYCIN 500 MG PO TABS: 500.0000 mg | ORAL_TABLET | Freq: Two times a day (BID) | ORAL | 0 refills | Status: AC

## 2023-06-28 MED ORDER — METRONIDAZOLE 500 MG PO TABS: 500.0000 mg | ORAL_TABLET | Freq: Two times a day (BID) | ORAL | 0 refills | Status: AC

## 2023-06-28 MED ORDER — OMEPRAZOLE 20 MG PO CPDR: 20.0000 mg | DELAYED_RELEASE_CAPSULE | Freq: Two times a day (BID) | ORAL | 0 refills | Status: AC

## 2023-06-28 MED ORDER — AMOXICILLIN 500 MG PO TABS: 1000.0000 mg | ORAL_TABLET | Freq: Two times a day (BID) | ORAL | 0 refills | Status: AC

## 2023-08-17 ENCOUNTER — Other Ambulatory Visit: Payer: No Typology Code available for payment source

## 2024-02-17 ENCOUNTER — Other Ambulatory Visit: Payer: Self-pay | Admitting: Obstetrics & Gynecology

## 2024-02-17 DIAGNOSIS — G519 Disorder of facial nerve, unspecified: Secondary | ICD-10-CM

## 2024-03-27 ENCOUNTER — Ambulatory Visit
Admission: RE | Admit: 2024-03-27 | Discharge: 2024-03-27 | Disposition: A | Discharge status: Home / Self Care | Source: Ambulatory Visit | Attending: Obstetrics & Gynecology | Admitting: Obstetrics & Gynecology

## 2024-03-27 DIAGNOSIS — G519 Disorder of facial nerve, unspecified: Secondary | ICD-10-CM

## 2024-03-29 ENCOUNTER — Ambulatory Visit: Payer: Self-pay | Admitting: Obstetrics & Gynecology
